# Patient Record
Sex: Female | Born: 1955 | Race: White | Hispanic: No | Smoking: Never smoker
Health system: Southern US, Community
[De-identification: ages and names within clinical notes are randomized; demographics above are authoritative.]

## PROBLEM LIST (undated history)

## (undated) DIAGNOSIS — E785 Hyperlipidemia, unspecified: Secondary | ICD-10-CM

## (undated) DIAGNOSIS — R519 Headache, unspecified: Secondary | ICD-10-CM

## (undated) DIAGNOSIS — R569 Unspecified convulsions: Secondary | ICD-10-CM

## (undated) DIAGNOSIS — G43909 Migraine, unspecified, not intractable, without status migrainosus: Secondary | ICD-10-CM

## (undated) DIAGNOSIS — I10 Essential (primary) hypertension: Secondary | ICD-10-CM

## (undated) DIAGNOSIS — G2581 Restless legs syndrome: Secondary | ICD-10-CM

## (undated) DIAGNOSIS — E079 Disorder of thyroid, unspecified: Secondary | ICD-10-CM

## (undated) DIAGNOSIS — E119 Type 2 diabetes mellitus without complications: Secondary | ICD-10-CM

## (undated) DIAGNOSIS — I499 Cardiac arrhythmia, unspecified: Secondary | ICD-10-CM

## (undated) DIAGNOSIS — R51 Headache: Secondary | ICD-10-CM

## (undated) HISTORY — PX: TONSILLECTOMY: SUR1361

## (undated) HISTORY — PX: ABDOMINAL HYSTERECTOMY: SHX81

## (undated) HISTORY — DX: Unspecified convulsions: R56.9

## (undated) HISTORY — PX: EYE SURGERY: SHX253

## (undated) HISTORY — DX: Migraine, unspecified, not intractable, without status migrainosus: G43.909

## (undated) HISTORY — PX: CARPAL TUNNEL RELEASE: SHX101

## (undated) HISTORY — DX: Hyperlipidemia, unspecified: E78.5

## (undated) HISTORY — PX: ADENOIDECTOMY: SUR15

---

## 1999-10-13 ENCOUNTER — Emergency Department (HOSPITAL_COMMUNITY): Admission: EM | Admit: 1999-10-13 | Discharge: 1999-10-13 | Payer: Self-pay | Admitting: *Deleted

## 2000-03-19 ENCOUNTER — Emergency Department (HOSPITAL_COMMUNITY): Admission: EM | Admit: 2000-03-19 | Discharge: 2000-03-19 | Payer: Self-pay | Admitting: Emergency Medicine

## 2000-03-19 ENCOUNTER — Encounter: Payer: Self-pay | Admitting: Emergency Medicine

## 2001-10-09 ENCOUNTER — Emergency Department (HOSPITAL_COMMUNITY): Admission: EM | Admit: 2001-10-09 | Discharge: 2001-10-09 | Payer: Self-pay | Admitting: Emergency Medicine

## 2002-02-16 ENCOUNTER — Emergency Department (HOSPITAL_COMMUNITY): Admission: EM | Admit: 2002-02-16 | Discharge: 2002-02-16 | Payer: Self-pay

## 2002-02-20 ENCOUNTER — Inpatient Hospital Stay (HOSPITAL_COMMUNITY): Admission: RE | Admit: 2002-02-20 | Discharge: 2002-02-20 | Payer: Self-pay | Admitting: *Deleted

## 2002-02-21 ENCOUNTER — Emergency Department (HOSPITAL_COMMUNITY): Admission: EM | Admit: 2002-02-21 | Discharge: 2002-02-21 | Payer: Self-pay | Admitting: Emergency Medicine

## 2002-03-08 ENCOUNTER — Emergency Department (HOSPITAL_COMMUNITY): Admission: EM | Admit: 2002-03-08 | Discharge: 2002-03-08 | Payer: Self-pay | Admitting: Emergency Medicine

## 2002-10-28 ENCOUNTER — Emergency Department (HOSPITAL_COMMUNITY): Admission: EM | Admit: 2002-10-28 | Discharge: 2002-10-28 | Payer: Self-pay | Admitting: *Deleted

## 2005-05-27 ENCOUNTER — Emergency Department (HOSPITAL_COMMUNITY): Admission: EM | Admit: 2005-05-27 | Discharge: 2005-05-27 | Payer: Self-pay | Admitting: Emergency Medicine

## 2005-06-28 ENCOUNTER — Emergency Department: Payer: Self-pay | Admitting: Emergency Medicine

## 2005-07-23 ENCOUNTER — Emergency Department: Payer: Self-pay | Admitting: Emergency Medicine

## 2005-08-02 ENCOUNTER — Ambulatory Visit: Payer: Self-pay | Admitting: Internal Medicine

## 2005-08-03 ENCOUNTER — Ambulatory Visit: Payer: Self-pay | Admitting: Internal Medicine

## 2005-08-23 ENCOUNTER — Ambulatory Visit: Payer: Self-pay | Admitting: *Deleted

## 2005-09-16 ENCOUNTER — Emergency Department (HOSPITAL_COMMUNITY): Admission: EM | Admit: 2005-09-16 | Discharge: 2005-09-16 | Payer: Self-pay | Admitting: Emergency Medicine

## 2005-11-10 ENCOUNTER — Emergency Department (HOSPITAL_COMMUNITY): Admission: EM | Admit: 2005-11-10 | Discharge: 2005-11-10 | Payer: Self-pay | Admitting: Emergency Medicine

## 2006-10-15 ENCOUNTER — Ambulatory Visit: Payer: Self-pay | Admitting: Nurse Practitioner

## 2006-11-19 ENCOUNTER — Ambulatory Visit: Payer: Self-pay | Admitting: Nurse Practitioner

## 2006-12-17 ENCOUNTER — Ambulatory Visit: Payer: Self-pay | Admitting: Nurse Practitioner

## 2007-01-02 ENCOUNTER — Emergency Department (HOSPITAL_COMMUNITY): Admission: EM | Admit: 2007-01-02 | Discharge: 2007-01-02 | Payer: Self-pay | Admitting: Emergency Medicine

## 2007-01-15 ENCOUNTER — Ambulatory Visit (HOSPITAL_COMMUNITY): Admission: RE | Admit: 2007-01-15 | Discharge: 2007-01-15 | Payer: Self-pay | Admitting: Nurse Practitioner

## 2007-01-17 ENCOUNTER — Ambulatory Visit: Payer: Self-pay | Admitting: Nurse Practitioner

## 2007-01-30 ENCOUNTER — Emergency Department (HOSPITAL_COMMUNITY): Admission: EM | Admit: 2007-01-30 | Discharge: 2007-01-30 | Payer: Self-pay | Admitting: Emergency Medicine

## 2007-01-30 ENCOUNTER — Ambulatory Visit: Payer: Self-pay | Admitting: Nurse Practitioner

## 2007-02-11 ENCOUNTER — Ambulatory Visit: Payer: Self-pay | Admitting: Nurse Practitioner

## 2007-06-25 ENCOUNTER — Encounter (INDEPENDENT_AMBULATORY_CARE_PROVIDER_SITE_OTHER): Payer: Self-pay | Admitting: *Deleted

## 2007-12-24 ENCOUNTER — Emergency Department (HOSPITAL_COMMUNITY): Admission: EM | Admit: 2007-12-24 | Discharge: 2007-12-24 | Payer: Self-pay | Admitting: Emergency Medicine

## 2008-01-22 ENCOUNTER — Emergency Department (HOSPITAL_COMMUNITY): Admission: EM | Admit: 2008-01-22 | Discharge: 2008-01-22 | Payer: Self-pay | Admitting: Emergency Medicine

## 2008-02-02 ENCOUNTER — Ambulatory Visit: Payer: Self-pay | Admitting: Internal Medicine

## 2008-02-04 ENCOUNTER — Encounter (INDEPENDENT_AMBULATORY_CARE_PROVIDER_SITE_OTHER): Payer: Self-pay | Admitting: Nurse Practitioner

## 2008-02-04 ENCOUNTER — Ambulatory Visit: Payer: Self-pay | Admitting: Internal Medicine

## 2008-02-04 LAB — CONVERTED CEMR LAB
ALT: 25 units/L (ref 0–35)
AST: 18 units/L (ref 0–37)
Albumin: 4.3 g/dL (ref 3.5–5.2)
Alkaline Phosphatase: 79 units/L (ref 39–117)
BUN: 11 mg/dL (ref 6–23)
CO2: 24 meq/L (ref 19–32)
Calcium: 9.2 mg/dL (ref 8.4–10.5)
Chloride: 106 meq/L (ref 96–112)
Cholesterol: 213 mg/dL — ABNORMAL HIGH (ref 0–200)
Creatinine, Ser: 0.82 mg/dL (ref 0.40–1.20)
Glucose, Bld: 109 mg/dL — ABNORMAL HIGH (ref 70–99)
HDL: 58 mg/dL (ref >39)
LDL Cholesterol: 123 mg/dL — ABNORMAL HIGH (ref 0–99)
Potassium: 4.8 meq/L (ref 3.5–5.3)
Sodium: 141 meq/L (ref 135–145)
TSH: 6.853 microintl units/mL — ABNORMAL HIGH (ref 0.350–5.50)
Total Bilirubin: 0.5 mg/dL (ref 0.3–1.2)
Total CHOL/HDL Ratio: 3.7
Total Protein: 7 g/dL (ref 6.0–8.3)
Triglycerides: 158 mg/dL — ABNORMAL HIGH (ref <150)
VLDL: 32 mg/dL (ref 0–40)

## 2010-07-27 ENCOUNTER — Emergency Department (HOSPITAL_COMMUNITY)
Admission: EM | Admit: 2010-07-27 | Discharge: 2010-07-27 | Payer: Self-pay | Source: Home / Self Care | Admitting: Emergency Medicine

## 2010-08-04 ENCOUNTER — Emergency Department (HOSPITAL_COMMUNITY): Admission: EM | Admit: 2010-08-04 | Discharge: 2010-08-04 | Payer: Self-pay | Admitting: Emergency Medicine

## 2010-11-17 ENCOUNTER — Inpatient Hospital Stay (INDEPENDENT_AMBULATORY_CARE_PROVIDER_SITE_OTHER)
Admission: RE | Admit: 2010-11-17 | Discharge: 2010-11-17 | Disposition: A | Discharge status: Home / Self Care | Payer: Self-pay | Source: Ambulatory Visit | Attending: Family Medicine | Admitting: Family Medicine

## 2010-11-17 DIAGNOSIS — R040 Epistaxis: Secondary | ICD-10-CM

## 2010-11-18 ENCOUNTER — Inpatient Hospital Stay (INDEPENDENT_AMBULATORY_CARE_PROVIDER_SITE_OTHER)
Admission: RE | Admit: 2010-11-18 | Discharge: 2010-11-18 | Disposition: A | Discharge status: Home / Self Care | Payer: Self-pay | Source: Ambulatory Visit | Attending: Emergency Medicine | Admitting: Emergency Medicine

## 2010-11-18 ENCOUNTER — Emergency Department (HOSPITAL_COMMUNITY)
Admission: EM | Admit: 2010-11-18 | Discharge: 2010-11-19 | Disposition: A | Discharge status: Home / Self Care | Payer: Self-pay | Attending: Emergency Medicine | Admitting: Emergency Medicine

## 2010-11-18 ENCOUNTER — Emergency Department (HOSPITAL_COMMUNITY)
Admission: EM | Admit: 2010-11-18 | Discharge: 2010-11-18 | Disposition: A | Discharge status: Home / Self Care | Payer: Self-pay | Attending: Emergency Medicine | Admitting: Emergency Medicine

## 2010-11-18 DIAGNOSIS — J45909 Unspecified asthma, uncomplicated: Secondary | ICD-10-CM | POA: Insufficient documentation

## 2010-11-18 DIAGNOSIS — I10 Essential (primary) hypertension: Secondary | ICD-10-CM

## 2010-11-18 DIAGNOSIS — Z79899 Other long term (current) drug therapy: Secondary | ICD-10-CM | POA: Insufficient documentation

## 2010-11-18 DIAGNOSIS — E039 Hypothyroidism, unspecified: Secondary | ICD-10-CM | POA: Insufficient documentation

## 2010-11-18 DIAGNOSIS — R040 Epistaxis: Secondary | ICD-10-CM | POA: Insufficient documentation

## 2010-11-18 DIAGNOSIS — F329 Major depressive disorder, single episode, unspecified: Secondary | ICD-10-CM | POA: Insufficient documentation

## 2010-11-18 DIAGNOSIS — F3289 Other specified depressive episodes: Secondary | ICD-10-CM | POA: Insufficient documentation

## 2010-11-18 LAB — POCT I-STAT, CHEM 8
Creatinine, Ser: 1 mg/dL (ref 0.4–1.2)
Glucose, Bld: 114 mg/dL — ABNORMAL HIGH (ref 70–99)
Hemoglobin: 14.3 g/dL (ref 12.0–15.0)
TCO2: 25 mmol/L (ref 0–100)

## 2010-11-18 LAB — CBC
MCH: 30.7 pg (ref 26.0–34.0)
MCHC: 32.6 g/dL (ref 30.0–36.0)
MCV: 94.2 fL (ref 78.0–100.0)
Platelets: 219 10*3/uL (ref 150–400)

## 2010-11-20 ENCOUNTER — Emergency Department (HOSPITAL_COMMUNITY)
Admission: EM | Admit: 2010-11-20 | Discharge: 2010-11-20 | Disposition: A | Discharge status: Home / Self Care | Payer: Self-pay | Attending: Emergency Medicine | Admitting: Emergency Medicine

## 2010-11-20 DIAGNOSIS — E039 Hypothyroidism, unspecified: Secondary | ICD-10-CM | POA: Insufficient documentation

## 2010-11-20 DIAGNOSIS — F329 Major depressive disorder, single episode, unspecified: Secondary | ICD-10-CM | POA: Insufficient documentation

## 2010-11-20 DIAGNOSIS — Z4800 Encounter for change or removal of nonsurgical wound dressing: Secondary | ICD-10-CM | POA: Insufficient documentation

## 2010-11-20 DIAGNOSIS — F3289 Other specified depressive episodes: Secondary | ICD-10-CM | POA: Insufficient documentation

## 2010-11-20 DIAGNOSIS — R040 Epistaxis: Secondary | ICD-10-CM | POA: Insufficient documentation

## 2010-11-20 DIAGNOSIS — I10 Essential (primary) hypertension: Secondary | ICD-10-CM | POA: Insufficient documentation

## 2010-11-20 DIAGNOSIS — J45909 Unspecified asthma, uncomplicated: Secondary | ICD-10-CM | POA: Insufficient documentation

## 2010-11-20 DIAGNOSIS — Z79899 Other long term (current) drug therapy: Secondary | ICD-10-CM | POA: Insufficient documentation

## 2010-12-12 ENCOUNTER — Emergency Department (HOSPITAL_COMMUNITY): Payer: Self-pay

## 2010-12-12 ENCOUNTER — Emergency Department (HOSPITAL_COMMUNITY)
Admission: EM | Admit: 2010-12-12 | Discharge: 2010-12-12 | Disposition: A | Discharge status: Home / Self Care | Payer: Self-pay | Attending: Emergency Medicine | Admitting: Emergency Medicine

## 2010-12-12 DIAGNOSIS — I10 Essential (primary) hypertension: Secondary | ICD-10-CM | POA: Insufficient documentation

## 2010-12-12 DIAGNOSIS — E039 Hypothyroidism, unspecified: Secondary | ICD-10-CM | POA: Insufficient documentation

## 2010-12-12 DIAGNOSIS — F329 Major depressive disorder, single episode, unspecified: Secondary | ICD-10-CM | POA: Insufficient documentation

## 2010-12-12 DIAGNOSIS — W01119A Fall on same level from slipping, tripping and stumbling with subsequent striking against unspecified sharp object, initial encounter: Secondary | ICD-10-CM | POA: Insufficient documentation

## 2010-12-12 DIAGNOSIS — Y9229 Other specified public building as the place of occurrence of the external cause: Secondary | ICD-10-CM | POA: Insufficient documentation

## 2010-12-12 DIAGNOSIS — W268XXA Contact with other sharp object(s), not elsewhere classified, initial encounter: Secondary | ICD-10-CM | POA: Insufficient documentation

## 2010-12-12 DIAGNOSIS — J029 Acute pharyngitis, unspecified: Secondary | ICD-10-CM | POA: Insufficient documentation

## 2010-12-12 DIAGNOSIS — S60559A Superficial foreign body of unspecified hand, initial encounter: Secondary | ICD-10-CM | POA: Insufficient documentation

## 2010-12-12 DIAGNOSIS — F3289 Other specified depressive episodes: Secondary | ICD-10-CM | POA: Insufficient documentation

## 2010-12-12 LAB — RAPID STREP SCREEN (MED CTR MEBANE ONLY): Streptococcus, Group A Screen (Direct): NEGATIVE

## 2011-07-02 LAB — URINE CULTURE: Colony Count: >=100000

## 2011-07-02 LAB — CBC
HCT: 43.8
Hemoglobin: 14.7
MCHC: 33.6
Platelets: 216
RDW: 14

## 2011-07-02 LAB — URINALYSIS, ROUTINE W REFLEX MICROSCOPIC
Hgb urine dipstick: NEGATIVE
Ketones, ur: NEGATIVE
Nitrite: POSITIVE — AB
Urobilinogen, UA: 1
pH: 5

## 2011-07-02 LAB — I-STAT 8, (EC8 V) (CONVERTED LAB)
BUN: 12
Bicarbonate: 26.5 — ABNORMAL HIGH
Glucose, Bld: 98
Sodium: 142
TCO2: 28
pCO2, Ven: 49.5
pH, Ven: 7.337 — ABNORMAL HIGH

## 2011-07-02 LAB — URINE MICROSCOPIC-ADD ON

## 2011-07-02 LAB — DIFFERENTIAL
Basophils Absolute: 0
Basophils Relative: 1
Eosinophils Absolute: 0.1
Eosinophils Relative: 2
Lymphocytes Relative: 37
Monocytes Absolute: 0.7

## 2011-07-02 LAB — POCT I-STAT CREATININE: Operator id: 257131

## 2011-10-07 ENCOUNTER — Emergency Department (HOSPITAL_COMMUNITY)
Admission: EM | Admit: 2011-10-07 | Discharge: 2011-10-07 | Disposition: A | Discharge status: Home / Self Care | Payer: Self-pay | Attending: Emergency Medicine | Admitting: Emergency Medicine

## 2011-10-07 ENCOUNTER — Emergency Department (HOSPITAL_COMMUNITY): Payer: Self-pay

## 2011-10-07 ENCOUNTER — Encounter: Payer: Self-pay | Admitting: *Deleted

## 2011-10-07 DIAGNOSIS — M545 Low back pain, unspecified: Secondary | ICD-10-CM | POA: Insufficient documentation

## 2011-10-07 DIAGNOSIS — N390 Urinary tract infection, site not specified: Secondary | ICD-10-CM | POA: Insufficient documentation

## 2011-10-07 DIAGNOSIS — M533 Sacrococcygeal disorders, not elsewhere classified: Secondary | ICD-10-CM | POA: Insufficient documentation

## 2011-10-07 DIAGNOSIS — M549 Dorsalgia, unspecified: Secondary | ICD-10-CM

## 2011-10-07 LAB — URINE MICROSCOPIC-ADD ON

## 2011-10-07 LAB — URINALYSIS, ROUTINE W REFLEX MICROSCOPIC
Glucose, UA: NEGATIVE mg/dL
Ketones, ur: NEGATIVE mg/dL
Nitrite: POSITIVE — AB
Specific Gravity, Urine: 1.022 (ref 1.005–1.030)
pH: 5.5 (ref 5.0–8.0)

## 2011-10-07 MED ORDER — IBUPROFEN 800 MG PO TABS: 800.0000 mg | ORAL_TABLET | Freq: Once | ORAL | Status: AC

## 2011-10-07 MED ORDER — CIPROFLOXACIN HCL 500 MG PO TABS: 500.0000 mg | ORAL_TABLET | Freq: Two times a day (BID) | ORAL | Status: AC

## 2011-10-07 MED ORDER — IBUPROFEN 800 MG PO TABS
800.0000 mg | ORAL_TABLET | Freq: Once | ORAL | Status: AC
  Administered 2011-10-07: 800 mg via ORAL
  Filled 2011-10-07: qty 1

## 2011-10-07 NOTE — ED Provider Notes (Signed)
History     CSN: [id number]  Arrival date & time 10/07/11  7829   First MD Initiated Contact with Patient 10/07/11 1010      Chief Complaint  Patient presents with  . Back Pain    (Consider location/radiation/quality/duration/timing/severity/associated sxs/prior treatment) HPI Comments: Patient reports lower back pain that began 3 days ago. Radiates down into her coccyx. Pain is exacerbated by lifting legs while lying flat and with walking.  Denies injury, heavy lifting, or falls.  Denies fevers, weakness or numbness of the extremities, abdominal pain, urinary, vaginal, or bowel symptoms, loss of control of bowel or bladder.    Patient is a 55 y.o. female presenting with back pain. The history is provided by the patient.  Back Pain     History reviewed. No pertinent past medical history.  History reviewed. No pertinent past surgical history.  History reviewed. No pertinent family history.  History  Substance Use Topics  . Smoking status: Not on file  . Smokeless tobacco: Not on file  . Alcohol Use: No    OB History    Grav Para Term Preterm Abortions TAB SAB Ect Mult Living                  Review of Systems  Musculoskeletal: Positive for back pain.  All other systems reviewed and are negative.    Allergies  Penicillins; Sulfa antibiotics; and Zithromax  Home Medications  No current outpatient prescriptions on file.  BP 156/94  Pulse 90  Temp(Src) 98.3 F (36.8 C) (Oral)  Resp 18  SpO2 95%  Physical Exam  Nursing note and vitals reviewed. Constitutional: She is oriented to person, place, and time. She appears well-developed and well-nourished.  HENT:  Head: Normocephalic and atraumatic.  Neck: Normal range of motion. Neck supple.  Cardiovascular: Normal rate, regular rhythm and normal heart sounds.   Pulmonary/Chest: Effort normal and breath sounds normal. No respiratory distress. She has no wheezes. She has no rales. She exhibits no tenderness.    Abdominal: Soft. She exhibits no distension and no mass. There is no tenderness. There is no rebound and no guarding.  Musculoskeletal: Normal range of motion. She exhibits no edema and no tenderness.       Cervical back: Normal.       Thoracic back: Normal.       Lumbar back: Normal.       Arms:      Tenderness over sacrum/coccyx, right low back.  Otherwise spine nontender.  Negative straight leg raise.    Neurological: She is alert and oriented to person, place, and time. She has normal strength. No sensory deficit.    ED Course  Procedures (including critical care time)  10:44 AM Patient seen and examined.  Patient a somewhat limited historian, but responds yes/no or in short phrases to direct questions. Pt with low back pain, no neurological findings, no fever.     Labs Reviewed  URINALYSIS, ROUTINE W REFLEX MICROSCOPIC - Abnormal; Notable for the following:    Hgb urine dipstick TRACE (*)    Bilirubin Urine SMALL (*)    Nitrite POSITIVE (*)    Leukocytes, UA MODERATE (*)    All other components within normal limits  URINE MICROSCOPIC-ADD ON - Abnormal; Notable for the following:    Squamous Epithelial / LPF FEW (*)    Bacteria, UA MANY (*)    Crystals CA OXALATE CRYSTALS (*)    All other components within normal limits  PREGNANCY, URINE  URINE CULTURE   Dg Lumbar Spine Complete  10/07/2011  *RADIOLOGY REPORT*  Clinical Data: Low back pain.  LUMBAR SPINE - COMPLETE 4+ VIEW  Comparison: 01/02/2007; 01/30/2007  Findings: There is six non-rib bearing lumbar-type vertebra and 12 pairs of ribs.  Accordingly, the lowest segmental vertebra is labeled S1.  No fracture or significant subluxation is observed.  Intervertebral spurring noted at the L1-2 level anterior to the vertebral body column.  Mild facet arthropathy noted at L5-S1 and S1-2 bilaterally.  IMPRESSION:  1.  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 2.  Anterior intervertebral  spurring at L1-2. 3.  If symptoms persist despite conservative therapy, MRI followup may be warranted.  Original Report Authenticated By: Dellia Cloud, M.D.   Dg Sacrum/coccyx  10/07/2011  *RADIOLOGY REPORT*  Clinical Data: Low back pain.  SACRUM AND COCCYX - 2+ VIEW  Comparison: 10/07/2011  Findings: Transitional s one vertebra is again noted.  The arcuate lines of the sacrum appear maintained.  No sacroiliac joint widening is observed.  No specific abnormality is noted.  IMPRESSION:  1.  No specific sacral abnormality is noted.  Original Report Authenticated By: Dellia Cloud, M.D.     1. Back pain   2. Urinary tract infection       MDM  Patient with three days of low back pain, found to have urinary tract infection.  Patient is a limited historian.  Discharged home with anti-inflammatories and antibiotics.  Urine sent for culture.  Patient to followup with primary care doctor, return to the ED for worsening symptoms.        Rise Patience, [place] 10/07/11 1224

## 2011-10-07 NOTE — ED Notes (Signed)
Reports lower back pain x 3 days, denies urinary symptoms. Ambulatory at triage.

## 2011-10-09 NOTE — ED Provider Notes (Signed)
Medical screening examination/treatment/procedure(s) were performed by non-physician practitioner and as supervising physician I was immediately available for consultation/collaboration.   Deasha Clendenin, MD 10/09/11 1515 

## 2012-02-15 ENCOUNTER — Emergency Department (HOSPITAL_COMMUNITY)
Admission: EM | Admit: 2012-02-15 | Discharge: 2012-02-15 | Disposition: A | Discharge status: Home / Self Care | Payer: Self-pay | Attending: Emergency Medicine | Admitting: Emergency Medicine

## 2012-02-15 ENCOUNTER — Encounter (HOSPITAL_COMMUNITY): Payer: Self-pay

## 2012-02-15 DIAGNOSIS — R599 Enlarged lymph nodes, unspecified: Secondary | ICD-10-CM | POA: Insufficient documentation

## 2012-02-15 DIAGNOSIS — R5383 Other fatigue: Secondary | ICD-10-CM | POA: Insufficient documentation

## 2012-02-15 DIAGNOSIS — R5381 Other malaise: Secondary | ICD-10-CM | POA: Insufficient documentation

## 2012-02-15 DIAGNOSIS — Z79899 Other long term (current) drug therapy: Secondary | ICD-10-CM | POA: Insufficient documentation

## 2012-02-15 DIAGNOSIS — J020 Streptococcal pharyngitis: Secondary | ICD-10-CM | POA: Insufficient documentation

## 2012-02-15 DIAGNOSIS — I10 Essential (primary) hypertension: Secondary | ICD-10-CM | POA: Insufficient documentation

## 2012-02-15 DIAGNOSIS — R509 Fever, unspecified: Secondary | ICD-10-CM | POA: Insufficient documentation

## 2012-02-15 DIAGNOSIS — J45909 Unspecified asthma, uncomplicated: Secondary | ICD-10-CM | POA: Insufficient documentation

## 2012-02-15 HISTORY — DX: Essential (primary) hypertension: I10

## 2012-02-15 HISTORY — DX: Restless legs syndrome: G25.81

## 2012-02-15 HISTORY — DX: Cardiac arrhythmia, unspecified: I49.9

## 2012-02-15 LAB — RAPID STREP SCREEN (MED CTR MEBANE ONLY): Streptococcus, Group A Screen (Direct): POSITIVE — AB

## 2012-02-15 MED ORDER — PENICILLIN G BENZATHINE 1200000 UNIT/2ML IM SUSP
1.2000 10*6.[IU] | Freq: Once | INTRAMUSCULAR | Status: AC
  Administered 2012-02-15: 1.2 10*6.[IU] via INTRAMUSCULAR
  Filled 2012-02-15: qty 2

## 2012-02-15 NOTE — ED Provider Notes (Signed)
Medical screening examination/treatment/procedure(s) were performed by non-physician practitioner and as supervising physician I was immediately available for consultation/collaboration.   [name], MD 02/15/12 [phone]

## 2012-02-15 NOTE — Discharge Instructions (Signed)
Read instructions below to learn more about your diagnosis and for reasons to return to the ED. Followup with your doctor if symptoms are not improving after 3-4 days.  If you do not have a doctor use the resource guide listed below to help he find one. You may return to the emergency department if symptoms worsen, become progressive, or become more concerning. You were treated for strep throat with the antibiotic shot that you were given today.    HOME CARE INSTRUCTIONS  Drink enough fluids to keep your urine clear or pale yellow.  Eat soft, cold foods such as ice cream, frozen ice pops, or gelatin dessert.  Gargle with warm salt water (1 tsp salt per 1 qt of water).  Throat lozenges  Use over the counter medications for symptomatic relief  (Tylenol for fever/pain, Motrin/Ibuprofen for swelling/pain)  To help prevent spreading strep pharyngitis to others, avoid:  Mouth-to-mouth contact with others.  Sharing utensils for eating and drinking.  Coughing around others.   SEEK IMMEDIATE MEDICAL CARE IF:  A rash develops.  Bloody substance is coughed up.  You are unable to swallow liquids or food for 24 hours.  You develop any new symptoms such as uncontrolable vomiting, severe headache, stiff neck, chest pain, or trouble breathing or swallowing.  You have severe throat pain along with drooling or voice changes.   You are unable to fully open the mouth.   RESOURCE GUIDE  Dental Problems  Patients with Medicaid: [facility] [phone] [address]                                           [phone] [address] Phone:  [phone]                                                  Phone:  [phone]  If unable to pay or uninsured, contact:  [facility] or [facility]. to become qualified for the adult dental clinic.  Chronic Pain Problems Contact [facility]  [phone] Patients need to be referred by their  primary care doctor.  Insufficient Money for Medicine Contact [employer]:  call "211" or [facility] 973-612-1936.  No Primary Care Doctor Call [facility]  414-022-6600 Other agencies that provide inexpensive medical care    [facility]  [phone]    [facility]  [phone]    [facility]  [phone]    [facility]  [phone]    [facility]  [phone]    [facility]  [phone]  Psychological Services [facility] Behavioral [facility]  [phone] [facility]  5060758042 [facility] Mental [facility]   [phone] (emergency services [phone])  Substance Abuse Resources [facility]  [phone] [facility] (325)681-1275 [facility] 343-259-7493 Floydene Flock [phone] Residential & Outpatient Substance Abuse Program  ([phone]  Abuse/Neglect [facility] Child Abuse Hotline 970-783-2317 [facility] Child Abuse Hotline (361)156-7541 (After Hours)  Emergency Shelter [facility] 236-108-4199  M[facility] [place] [facility] 820-307-3911)  [phone] [employer] Services [phone]  MRSA Hotline #:   510-288-2694    [facility] Resources  [facility] [place]     [employer]                          [facility] Dept. [address] [place]                       [address]      [address]  Blondell Reveal Phone:  [phone]                                   Phone:  [phone]                 Phone:  [phone]  [facility] Mental [facility] Phone:  [phone]  [employer] Child Abuse Hotline [phone] [phone]) [phone] (After Hours)

## 2012-02-15 NOTE — ED Provider Notes (Signed)
History     CSN: [id number]  Arrival date & time 02/15/12  9629   First MD Initiated Contact with Patient 02/15/12 1011      Chief Complaint  Patient presents with  . Sore Throat    (Consider location/radiation/quality/duration/timing/severity/associated sxs/prior treatment) Patient is a 56 y.o. female presenting with pharyngitis. The history is provided by the patient.  Sore Throat This is a new problem. The current episode started yesterday. The problem occurs constantly. The problem has been gradually worsening. Associated symptoms include chills, fatigue, a fever, a sore throat and swollen glands. Pertinent negatives include no abdominal pain, congestion, coughing, headaches, nausea, neck pain, rash or vomiting. The symptoms are aggravated by swallowing. She has tried nothing for the symptoms.    Past Medical History  Diagnosis Date  . Hypertension   . Irregular heart beat   . Restless leg syndrome   . Asthma     Past Surgical History  Procedure Date  . Eye surgery   . Tonsillectomy   . Cesarean section   . Abdominal hysterectomy   . Carpal tunnel release     History reviewed. No pertinent family history.  History  Substance Use Topics  . Smoking status: Not on file  . Smokeless tobacco: Not on file  . Alcohol Use: No    OB History    Grav Para Term Preterm Abortions TAB SAB Ect Mult Living                  Review of Systems  Constitutional: Positive for fever, chills and fatigue.       Subjective fever  HENT: Positive for sore throat. Negative for ear pain, congestion, rhinorrhea, drooling, trouble swallowing, neck pain, neck stiffness, voice change and sinus pressure.   Respiratory: Negative for cough.   Gastrointestinal: Negative for nausea, vomiting and abdominal pain.  Skin: Negative for pallor and rash.  Neurological: Negative for headaches.    Allergies  Penicillins; Sulfa antibiotics; and Zithromax  Home Medications   Current Outpatient  Rx  Name Route Sig Dispense Refill  . CLONAZEPAM 0.5 MG PO TABS Oral Take 0.5 mg by mouth at bedtime as needed. For sleep    . FLUOXETINE HCL 20 MG PO CAPS Oral Take 20 mg by mouth daily.      BP 141/96  Pulse 93  Temp(Src) 98.4 F (36.9 C) (Oral)  Resp 16  SpO2 95%  Physical Exam  Nursing note and vitals reviewed. Constitutional: She is oriented to person, place, and time. She appears well-developed and well-nourished. No distress.  HENT:  Head: Normocephalic and atraumatic. No trismus in the jaw.  Right Ear: Tympanic membrane, external ear and ear canal normal.  Left Ear: Tympanic membrane, external ear and ear canal normal.  Nose: Nose normal. No rhinorrhea. Right sinus exhibits no maxillary sinus tenderness and no frontal sinus tenderness. Left sinus exhibits no maxillary sinus tenderness and no frontal sinus tenderness.  Mouth/Throat: Uvula is midline and mucous membranes are normal. Normal dentition. No dental abscesses or uvula swelling. Oropharyngeal exudate and posterior oropharyngeal edema present. No posterior oropharyngeal erythema or tonsillar abscesses.       No submental edema, tongue not elevated, no trismus. No impending airway obstruction; Pt able to speak full sentences, swallow intact, no drooling, stridor, or tonsillar/uvula displacement. No palatal petechia  Eyes: Conjunctivae are normal.  Neck: Trachea normal, normal range of motion and full passive range of motion without pain. Neck supple. No rigidity. Erythema present. Normal range of  motion present.       Flexion and extension of neck without pain or difficulty. Able to breath without difficulty in extension.  Cardiovascular: Normal rate and regular rhythm.   Pulmonary/Chest: Effort normal and breath sounds normal. No stridor. No respiratory distress. She has no wheezes.  Abdominal: Soft. There is no tenderness.       No obvious evidence of splenomegaly. Non ttp.   Musculoskeletal: Normal range of motion.    Lymphadenopathy:       Head (right side): No preauricular and no posterior auricular adenopathy present.       Head (left side): No preauricular and no posterior auricular adenopathy present.    She has cervical adenopathy.  Neurological: She is alert and oriented to person, place, and time.  Skin: Skin is warm and dry. No rash noted. She is not diaphoretic.  Psychiatric: She has a normal mood and affect.    ED Course  Procedures (including critical care time)  Labs Reviewed  RAPID STREP SCREEN - Abnormal; Notable for the following:    Streptococcus, Group A Screen (Direct) POSITIVE (*)    All other components within normal limits   No results found.   No diagnosis found.    MDM  Patient with sore throat positive for Strep throat.  No drooling, changes in voice, or difficulty swallowing.  Uvula midline.  Patient given Bicillin IM while in ED and discharged home.  Return precautions discussed.        Pascal Lux [place], PA-C 02/15/12 1708

## 2012-02-15 NOTE — ED Notes (Signed)
Patient presents with sore throat since yesterday; patient reports she may have been running a fever at home yesterday. Patient also reporting painful swallowing.  No airway distress noted

## 2012-02-15 NOTE — ED Notes (Signed)
Mild redness noted to throat, patient reporting painful swallowing.  No acute distress noted.

## 2012-06-10 ENCOUNTER — Emergency Department (HOSPITAL_COMMUNITY)
Admission: EM | Admit: 2012-06-10 | Discharge: 2012-06-10 | Disposition: A | Discharge status: Home / Self Care | Payer: Self-pay | Attending: Emergency Medicine | Admitting: Emergency Medicine

## 2012-06-10 ENCOUNTER — Encounter (HOSPITAL_COMMUNITY): Payer: Self-pay

## 2012-06-10 DIAGNOSIS — R5381 Other malaise: Secondary | ICD-10-CM | POA: Insufficient documentation

## 2012-06-10 DIAGNOSIS — G2581 Restless legs syndrome: Secondary | ICD-10-CM | POA: Insufficient documentation

## 2012-06-10 DIAGNOSIS — E079 Disorder of thyroid, unspecified: Secondary | ICD-10-CM | POA: Insufficient documentation

## 2012-06-10 DIAGNOSIS — J45909 Unspecified asthma, uncomplicated: Secondary | ICD-10-CM | POA: Insufficient documentation

## 2012-06-10 DIAGNOSIS — I10 Essential (primary) hypertension: Secondary | ICD-10-CM | POA: Insufficient documentation

## 2012-06-10 DIAGNOSIS — N390 Urinary tract infection, site not specified: Secondary | ICD-10-CM | POA: Insufficient documentation

## 2012-06-10 DIAGNOSIS — R5383 Other fatigue: Secondary | ICD-10-CM

## 2012-06-10 HISTORY — DX: Disorder of thyroid, unspecified: E07.9

## 2012-06-10 LAB — T4, FREE: Free T4: 1.13 ng/dL (ref 0.80–1.80)

## 2012-06-10 LAB — URINALYSIS, ROUTINE W REFLEX MICROSCOPIC
Bilirubin Urine: NEGATIVE
Glucose, UA: NEGATIVE mg/dL
Ketones, ur: NEGATIVE mg/dL
Protein, ur: NEGATIVE mg/dL

## 2012-06-10 LAB — CBC
HCT: 42.2 % (ref 36.0–46.0)
Hemoglobin: 14.2 g/dL (ref 12.0–15.0)
MCH: 30.8 pg (ref 26.0–34.0)
MCHC: 33.6 g/dL (ref 30.0–36.0)
RDW: 13.9 % (ref 11.5–15.5)

## 2012-06-10 LAB — POCT I-STAT, CHEM 8
BUN: 13 mg/dL (ref 6–23)
Calcium, Ion: 1.2 mmol/L (ref 1.12–1.23)
Creatinine, Ser: 1.1 mg/dL (ref 0.50–1.10)
Glucose, Bld: 131 mg/dL — ABNORMAL HIGH (ref 70–99)
TCO2: 24 mmol/L (ref 0–100)

## 2012-06-10 LAB — GLUCOSE, CAPILLARY: Glucose-Capillary: 129 mg/dL — ABNORMAL HIGH (ref 70–99)

## 2012-06-10 LAB — TSH: TSH: 3.479 u[IU]/mL (ref 0.350–4.500)

## 2012-06-10 MED ORDER — NITROFURANTOIN MONOHYD MACRO 100 MG PO CAPS
100.0000 mg | ORAL_CAPSULE | Freq: Once | ORAL | Status: AC
  Administered 2012-06-10: 100 mg via ORAL
  Filled 2012-06-10: qty 1

## 2012-06-10 MED ORDER — NITROFURANTOIN MONOHYD MACRO 100 MG PO CAPS: 100.0000 mg | ORAL_CAPSULE | Freq: Two times a day (BID) | ORAL | Status: AC

## 2012-06-10 NOTE — ED Provider Notes (Signed)
History     CSN: [id number]  Arrival date & time 06/10/12  1354   First MD Initiated Contact with Patient 06/10/12 1507      Chief Complaint  Patient presents with  . Weakness    thyroid problem    (Consider location/radiation/quality/duration/timing/severity/associated sxs/prior treatment) HPI Comments: "I been feeling weak.  I know it's my thyroid again."  Sandra Patrick presents for evaluation of weakness. She states that she lost her medical benefits some time ago and has not taken her thyroid medication in a year. Over the last several weeks she feels as if she has no energy. She describes feeling constantly tired. She came to the hospital today because this fatigue has been interfering with her ability to perform her job at a [employer].  She denies any fevers, NVD, dysuria, CP, SOB, or syncope.    Patient is a 56 y.o. female presenting with weakness. The history is provided by the patient.  Weakness Primary symptoms do not include headaches, syncope, loss of consciousness, altered mental status, seizures, dizziness, visual change, paresthesias, focal weakness, loss of sensation, speech change, memory loss, fever, nausea or vomiting. The symptoms began more than 1 week ago. The symptoms are unchanged. The neurological symptoms are diffuse. Context: constantly.  Additional symptoms include weakness. Additional symptoms do not include neck stiffness, pain, lower back pain, leg pain, loss of balance, photophobia, aura, hallucinations, nystagmus, taste disturbance, hyperacusis, hearing loss, tinnitus, vertigo, anxiety, irritability or dysphoric mood. Medical issues do not include seizures, cerebral vascular accident, cancer, alcohol use, drug use, diabetes, hypertension or recent surgery. Associated medical issues comments: hypothyroidism.    Past Medical History  Diagnosis Date  . Hypertension   . Irregular heart beat   . Restless leg syndrome   . Asthma   . Thyroid disease     Past  Surgical History  Procedure Date  . Eye surgery   . Tonsillectomy   . Cesarean section   . Abdominal hysterectomy   . Carpal tunnel release     No family history on file.  History  Substance Use Topics  . Smoking status: Not on file  . Smokeless tobacco: Not on file  . Alcohol Use: No    OB History    Grav Para Term Preterm Abortions TAB SAB Ect Mult Living                  Review of Systems  Constitutional: Positive for fatigue. Negative for fever, chills, diaphoresis, activity change, appetite change, irritability and unexpected weight change.  HENT: Negative for hearing loss, congestion, sore throat, facial swelling, rhinorrhea, trouble swallowing, neck pain, neck stiffness, voice change and tinnitus.   Eyes: Negative for photophobia and visual disturbance.  Respiratory: Negative for cough, chest tightness and shortness of breath.   Cardiovascular: Negative for chest pain, palpitations, leg swelling and syncope.  Gastrointestinal: Negative for nausea, vomiting, abdominal pain and diarrhea.  Genitourinary: Negative for dysuria, urgency, frequency, hematuria, decreased urine volume and difficulty urinating.  Musculoskeletal: Negative for myalgias, back pain and arthralgias.  Skin: Negative for rash.  Neurological: Positive for weakness. Negative for dizziness, vertigo, speech change, focal weakness, seizures, loss of consciousness, syncope, light-headedness, numbness, headaches, paresthesias and loss of balance.  Hematological: Negative.   Psychiatric/Behavioral: Negative for hallucinations, memory loss, dysphoric mood, decreased concentration and altered mental status. The patient is not nervous/anxious.     Allergies  Penicillins; Sulfa antibiotics; and Zithromax  Home Medications   Current Outpatient Rx  Name Route  Sig Dispense Refill  . CLONAZEPAM 0.5 MG PO TABS Oral Take 0.5 mg by mouth at bedtime as needed. For sleep    . FLUOXETINE HCL 20 MG PO CAPS Oral Take 20  mg by mouth daily.      BP 155/87  Pulse 72  Temp 98.2 F (36.8 C) (Oral)  Resp 16  Ht 5\' 2"  (1.575 m)  Wt 175 lb (79.379 kg)  BMI 32.01 kg/m2  SpO2 99%  Physical Exam  Nursing note and vitals reviewed. Constitutional: She is oriented to person, place, and time. She appears well-developed and well-nourished. No distress.  HENT:  Head: Normocephalic and atraumatic.  Right Ear: External ear normal.  Left Ear: External ear normal.  Nose: Nose normal.  Mouth/Throat: Oropharynx is clear and moist. No oropharyngeal exudate.  Eyes: Conjunctivae are normal. Pupils are equal, round, and reactive to light. Right eye exhibits no discharge. Left eye exhibits no discharge. No scleral icterus.  Neck: Normal range of motion. Neck supple. No JVD present. No tracheal deviation present. No thyromegaly present.  Cardiovascular: Normal rate, regular rhythm, normal heart sounds and intact distal pulses.  Exam reveals no gallop and no friction rub.   No murmur heard. Pulmonary/Chest: Effort normal and breath sounds normal. No stridor. No respiratory distress. She has no wheezes. She has no rales. She exhibits no tenderness.  Abdominal: Soft. Bowel sounds are normal. She exhibits no distension and no mass. There is no tenderness. There is no rebound and no guarding.  Musculoskeletal: Normal range of motion. She exhibits no edema and no tenderness.  Lymphadenopathy:    She has no cervical adenopathy.  Neurological: She is alert and oriented to person, place, and time.  Skin: Skin is warm and dry. No rash noted. She is not diaphoretic. No erythema. No pallor.  Psychiatric: Her speech is normal and behavior is normal. Thought content normal. Her mood appears not anxious. Her affect is not angry and not blunt. She is not agitated, not aggressive, is not hyperactive, not actively hallucinating and not combative. Thought content is not paranoid. Cognition and memory are normal. Cognition and memory are not  impaired. She exhibits a depressed mood. She expresses no homicidal and no suicidal ideation. She exhibits normal recent memory and normal remote memory. She is attentive.    ED Course  Procedures (including critical care time)  Labs Reviewed  URINALYSIS, ROUTINE W REFLEX MICROSCOPIC - Abnormal; Notable for the following:    APPearance CLOUDY (*)     Nitrite POSITIVE (*)     Leukocytes, UA LARGE (*)     All other components within normal limits  POCT I-STAT, CHEM 8 - Abnormal; Notable for the following:    Glucose, Bld 131 (*)     Hemoglobin 15.3 (*)     All other components within normal limits  GLUCOSE, CAPILLARY - Abnormal; Notable for the following:    Glucose-Capillary 129 (*)     All other components within normal limits  URINE MICROSCOPIC-ADD ON - Abnormal; Notable for the following:    Squamous Epithelial / LPF FEW (*)     Bacteria, UA MANY (*)     All other components within normal limits  CBC  TSH  T4, FREE  URINE CULTURE   No results found.   No diagnosis found.    MDM  Pt presents for evaluation of weakness - No distress noted, stable VS.  Note U/A consistent with a UTI although she denies any symptoms.  Urine culture ordered.  Will treat also.  No significant abnormalities noted on CBC or BMP.  Will obtain a TSH and free T4.  1805.  Pt remains stable, NAD.  Thyroid studies still pending.  Plan discharge home with course of macrobid for UTI.  Discussed indications for rtn to ER.  She demonstrates understanding.      Tobin Chad, MD 06/10/12 [phone]

## 2012-06-10 NOTE — ED Notes (Signed)
Pt has been off thyroid medication x 1 year- unable  to afford.  Pt reports yesterday felt fine and today "not so good" missed work and will need a work note

## 2012-06-10 NOTE — ED Notes (Signed)
Pt presents with no acute distress- symptoms began last Friday with vomiting. Denies vomiting at present- Pt reports ?thryoid level is low

## 2012-06-10 NOTE — ED Notes (Signed)
Pt denies chest pain and shortness of breath.

## 2012-06-10 NOTE — ED Notes (Signed)
Instructed PT I  was going to do an EKG PT refused notified RN Morrie Sheldon

## 2012-06-10 NOTE — ED Notes (Signed)
Bed:WA12<BR> Expected date:<BR> Expected time:<BR> Means of arrival:<BR> Comments:<BR>

## 2012-06-10 NOTE — ED Notes (Signed)
Pt reports increasing fatigue since last Thursday. States has been off medication x's 1 year and feels like she needs her thyroid medication. Denies other complaints. No neuro deficits noted.

## 2012-06-12 LAB — URINE CULTURE

## 2012-06-13 NOTE — ED Notes (Signed)
+  Urine. Patient treated with Macrobid. Sensitive to same. Per protocol MD. °

## 2012-08-19 ENCOUNTER — Emergency Department (HOSPITAL_COMMUNITY)
Admission: EM | Admit: 2012-08-19 | Discharge: 2012-08-19 | Disposition: A | Discharge status: Home / Self Care | Payer: Self-pay | Attending: Emergency Medicine | Admitting: Emergency Medicine

## 2012-08-19 ENCOUNTER — Encounter (HOSPITAL_COMMUNITY): Payer: Self-pay | Admitting: *Deleted

## 2012-08-19 DIAGNOSIS — Y9389 Activity, other specified: Secondary | ICD-10-CM | POA: Insufficient documentation

## 2012-08-19 DIAGNOSIS — Z862 Personal history of diseases of the blood and blood-forming organs and certain disorders involving the immune mechanism: Secondary | ICD-10-CM | POA: Insufficient documentation

## 2012-08-19 DIAGNOSIS — M549 Dorsalgia, unspecified: Secondary | ICD-10-CM

## 2012-08-19 DIAGNOSIS — J45909 Unspecified asthma, uncomplicated: Secondary | ICD-10-CM | POA: Insufficient documentation

## 2012-08-19 DIAGNOSIS — X500XXA Overexertion from strenuous movement or load, initial encounter: Secondary | ICD-10-CM | POA: Insufficient documentation

## 2012-08-19 DIAGNOSIS — I10 Essential (primary) hypertension: Secondary | ICD-10-CM | POA: Insufficient documentation

## 2012-08-19 DIAGNOSIS — Z8679 Personal history of other diseases of the circulatory system: Secondary | ICD-10-CM | POA: Insufficient documentation

## 2012-08-19 DIAGNOSIS — Y929 Unspecified place or not applicable: Secondary | ICD-10-CM | POA: Insufficient documentation

## 2012-08-19 DIAGNOSIS — IMO0002 Reserved for concepts with insufficient information to code with codable children: Secondary | ICD-10-CM | POA: Insufficient documentation

## 2012-08-19 DIAGNOSIS — Z8639 Personal history of other endocrine, nutritional and metabolic disease: Secondary | ICD-10-CM | POA: Insufficient documentation

## 2012-08-19 MED ORDER — OXYCODONE-ACETAMINOPHEN 5-325 MG PO TABS
1.0000 | ORAL_TABLET | Freq: Once | ORAL | Status: AC
  Administered 2012-08-19: 1 via ORAL
  Filled 2012-08-19: qty 1

## 2012-08-19 MED ORDER — IBUPROFEN 600 MG PO TABS: 600.0000 mg | ORAL_TABLET | Freq: Four times a day (QID) | ORAL | Status: DC | PRN

## 2012-08-19 MED ORDER — HYDROCODONE-ACETAMINOPHEN 5-325 MG PO TABS: 1.0000 | ORAL_TABLET | Freq: Four times a day (QID) | ORAL | Status: DC | PRN

## 2012-08-19 MED ORDER — IBUPROFEN 800 MG PO TABS
800.0000 mg | ORAL_TABLET | Freq: Once | ORAL | Status: AC
  Administered 2012-08-19: 800 mg via ORAL
  Filled 2012-08-19: qty 1

## 2012-08-19 NOTE — ED Notes (Signed)
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 Reports she standing "she feels like she has a lot of pressure on her nerves" in her back. Denies falling.   Pt has hx of HTN, does not take medications.

## 2012-08-19 NOTE — ED Provider Notes (Signed)
History     CSN: [id number]  Arrival date & time 08/19/12  1413   First MD Initiated Contact with Patient 08/19/12 1511      Chief Complaint  Patient presents with  . Back Pain    (Consider location/radiation/quality/duration/timing/severity/associated sxs/prior treatment) HPI Comments: Patient is a 56 year old female that presents emergency department complaining of back pain.  Onset of symptoms began last night after removing heavy objects from work truck.  Pain is located primarily in the lumbar region and is described as sharp pain with certain movements that is worsened by ambulation and palpation.  Patient states that she took 3 Klonopin last night and 3 again this morning without relief.  She has not tried any other medications.  Pain rated at a 10/10 in severity with radiation down right thigh.  Patient denies any urinary symptoms, numbness tingling or weakness in lower extremities, loss control of bowel or bladder, cancer or, IV drug use, fever, night sweats, chills or weight loss.  Patient is a 56 y.o. female presenting with back pain. The history is provided by the patient.  Back Pain  Pertinent negatives include no chest pain, no fever, no numbness, no headaches, no abdominal pain and no weakness.    Past Medical History  Diagnosis Date  . Hypertension   . Irregular heart beat   . Restless leg syndrome   . Asthma   . Thyroid disease     Past Surgical History  Procedure Date  . Eye surgery   . Tonsillectomy   . Cesarean section   . Abdominal hysterectomy   . Carpal tunnel release     History reviewed. No pertinent family history.  History  Substance Use Topics  . Smoking status: Not on file  . Smokeless tobacco: Not on file  . Alcohol Use: No    OB History    Grav Para Term Preterm Abortions TAB SAB Ect Mult Living                  Review of Systems  Constitutional: Positive for activity change. Negative for fever, chills, fatigue and unexpected  weight change.  HENT: Negative for neck pain and neck stiffness.   Eyes: Negative for visual disturbance.  Respiratory: Negative for shortness of breath.   Cardiovascular: Negative for chest pain and leg swelling.  Gastrointestinal: Negative for nausea, abdominal pain, constipation and rectal pain.  Genitourinary: Negative for urgency and difficulty urinating.       Patient denies bowel and bladder incontinence.  Musculoskeletal: Positive for back pain and gait problem. Negative for myalgias, joint swelling and arthralgias.  Neurological: Negative for weakness, numbness and headaches.  All other systems reviewed and are negative.    Allergies  Penicillins; Sulfa antibiotics; and Zithromax  Home Medications   Current Outpatient Rx  Name  Route  Sig  Dispense  Refill  . CLONAZEPAM 0.5 MG PO TABS   Oral   Take 0.5 mg by mouth at bedtime as needed. For sleep         . FLUOXETINE HCL 20 MG PO CAPS   Oral   Take 20 mg by mouth daily.           BP 146/105  Pulse 80  Temp 98.1 F (36.7 C) (Oral)  Resp 16  SpO2 96%  Physical Exam  Nursing note and vitals reviewed. Constitutional: She is oriented to person, place, and time. She appears well-developed and well-nourished. No distress.  HENT:  Head: Normocephalic and atraumatic.  Eyes: Conjunctivae normal are normal. No scleral icterus.  Neck: Normal range of motion and full passive range of motion without pain. Neck supple. No spinous process tenderness and no muscular tenderness present. No rigidity. Normal range of motion present. No Brudzinski's sign noted.  Cardiovascular: Normal rate, regular rhythm and intact distal pulses.  Exam reveals no gallop and no friction rub.   No murmur heard. Pulmonary/Chest: Effort normal and breath sounds normal. No respiratory distress. She has no wheezes. She has no rales. She exhibits no tenderness.  Musculoskeletal:       Cervical back: She exhibits normal range of motion, no  tenderness, no bony tenderness and no pain.       Thoracic back: She exhibits no tenderness, no bony tenderness and no pain.       Lumbar back: She exhibits tenderness, bony tenderness and pain. She exhibits no spasm and normal pulse.       Right foot: She exhibits no swelling.       Left foot: She exhibits no swelling.       Bilateral lower extremities nontender without color change, baseline range of motion of extremities with intact distal pulses, capillary refill less than 2 seconds bilaterally.  Pt has increased pain w ROM of lumbar spine. Pain w ambulation, no sign of ataxia.  Neurological: She is alert and oriented to person, place, and time. She has normal strength and normal reflexes. No sensory deficit. Gait (no ataxia, slowed and hunched d/t pain ) abnormal.       Sensation at baseline for light touch in all 4 distal extremities, motor symmetric & bilateral 5/5 (hips: abduction, adduction, flexion; knee: flexion & extension; foot: dorsiflexion, plantar flexion, toes: dorsi flexion)   Skin: Skin is warm and dry. No rash noted. She is not diaphoretic. No erythema. No pallor.  Psychiatric: She has a normal mood and affect.    ED Course  Procedures (including critical care time)  Labs Reviewed - No data to display No results found.   No diagnosis found.    MDM  Low Back pain/ muscle strain  Patient with back pain.  No neurological deficits and normal neuro exam.  Patient can walk but states is painful.  No loss of bowel or bladder control.  No concern for cauda equina.  No fever, night sweats, weight loss, h/o cancer, IVDU.  Work not given, no heavy lifting > 10 lbs. RICE protocol and pain medicine indicated and discussed with patient.  And he        Jaci Carrel, [place] 08/19/12 1555

## 2012-08-20 NOTE — ED Provider Notes (Signed)
Medical screening examination/treatment/procedure(s) were performed by non-physician practitioner and as supervising physician I was immediately available for consultation/collaboration.  Flint Melter, MD 08/20/12 [phone]

## 2012-08-22 ENCOUNTER — Encounter (HOSPITAL_COMMUNITY): Payer: Self-pay | Admitting: *Deleted

## 2012-08-22 ENCOUNTER — Emergency Department (HOSPITAL_COMMUNITY)
Admission: EM | Admit: 2012-08-22 | Discharge: 2012-08-22 | Disposition: A | Discharge status: Home / Self Care | Payer: Self-pay | Attending: Emergency Medicine | Admitting: Emergency Medicine

## 2012-08-22 DIAGNOSIS — M549 Dorsalgia, unspecified: Secondary | ICD-10-CM

## 2012-08-22 DIAGNOSIS — Z8639 Personal history of other endocrine, nutritional and metabolic disease: Secondary | ICD-10-CM | POA: Insufficient documentation

## 2012-08-22 DIAGNOSIS — M545 Low back pain, unspecified: Secondary | ICD-10-CM | POA: Insufficient documentation

## 2012-08-22 DIAGNOSIS — Z862 Personal history of diseases of the blood and blood-forming organs and certain disorders involving the immune mechanism: Secondary | ICD-10-CM | POA: Insufficient documentation

## 2012-08-22 DIAGNOSIS — Z79899 Other long term (current) drug therapy: Secondary | ICD-10-CM | POA: Insufficient documentation

## 2012-08-22 DIAGNOSIS — I10 Essential (primary) hypertension: Secondary | ICD-10-CM | POA: Insufficient documentation

## 2012-08-22 DIAGNOSIS — J45909 Unspecified asthma, uncomplicated: Secondary | ICD-10-CM | POA: Insufficient documentation

## 2012-08-22 DIAGNOSIS — Z8679 Personal history of other diseases of the circulatory system: Secondary | ICD-10-CM | POA: Insufficient documentation

## 2012-08-22 DIAGNOSIS — G2581 Restless legs syndrome: Secondary | ICD-10-CM | POA: Insufficient documentation

## 2012-08-22 NOTE — ED Notes (Signed)
Pt was seen 11/12 for lower back pain. At present pt denies pain. Reports she was not able to take pain medication because it made her stomach upset. Pt reports her boss at work will not accept her work note, and that she needs to be physically cleared of all restrictions.

## 2012-08-22 NOTE — ED Provider Notes (Signed)
Medical screening examination/treatment/procedure(s) were performed by non-physician practitioner and as supervising physician I was immediately available for consultation/collaboration.  Raeford Razor, MD 08/22/12 [phone]

## 2012-08-22 NOTE — ED Notes (Signed)
Pt escorted to d/c window. Pt verbalized understanding d/c instructions. In no acute distress 

## 2012-08-22 NOTE — ED Notes (Signed)
PA at bedside Pt alert and oriented x4. Respirations even and unlabored, bilateral symmetrical rise and fall of chest. Skin warm and dry. In no acute distress. Denies needs.   

## 2012-08-22 NOTE — ED Provider Notes (Signed)
History     CSN: [id number]  Arrival date & time 08/22/12  4696   First MD Initiated Contact with Patient 08/22/12 [phone]      Chief Complaint  Patient presents with  . re-evaluation     (Consider location/radiation/quality/duration/timing/severity/associated sxs/prior treatment) HPI  Patient is a 56 year old female that presents emergency department wanting her work restriction note revoked. He had come to the ER a few days ago complaining of back pain. Onset of symptoms had begun  after removing heavy objects from work truck. Pain was located primarily in the lumbar region and is described as sharp pain with certain movements that is worsened by ambulation and palpation. She denies having anymore pain and wants to be able to work without restrictions. Patient denies any urinary symptoms, numbness tingling or weakness in lower extremities, loss control of bowel or bladder, cancer or, IV drug use, fever, night sweats, chills or weight loss.   Past Medical History  Diagnosis Date  . Hypertension   . Irregular heart beat   . Restless leg syndrome   . Asthma   . Thyroid disease     Past Surgical History  Procedure Date  . Eye surgery   . Tonsillectomy   . Cesarean section   . Abdominal hysterectomy   . Carpal tunnel release     History reviewed. No pertinent family history.  History  Substance Use Topics  . Smoking status: Not on file  . Smokeless tobacco: Not on file  . Alcohol Use: No    OB History    Grav Para Term Preterm Abortions TAB SAB Ect Mult Living                  Review of Systems  Review of Systems  Gen: no weight loss, fevers, chills, night sweats  Lungs:No wheezing, coughing or hemoptysis CV: no chest pain, palpitations, dependent edema or orthopnea  Abd: no abdominal pain, nausea, vomiting  GU: no dysuria or gross hematuria  MSK:  Low back injury Neuro: no headache, no focal neurologic deficits  Skin: no abnormalities Psyche:  negative.   Allergies  Penicillins; Sulfa antibiotics; and Zithromax  Home Medications   Current Outpatient Rx  Name  Route  Sig  Dispense  Refill  . CLONAZEPAM 0.5 MG PO TABS   Oral   Take 0.5 mg by mouth at bedtime as needed. For sleep         . FLUOXETINE HCL 20 MG PO CAPS   Oral   Take 20 mg by mouth daily.         Marland Kitchen HYDROCODONE-ACETAMINOPHEN 5-325 MG PO TABS   Oral   Take 1 tablet by mouth every 6 (six) hours as needed for pain.   12 tablet   0   . IBUPROFEN 600 MG PO TABS   Oral   Take 1 tablet (600 mg total) by mouth every 6 (six) hours as needed for pain.   30 tablet   0     BP 137/88  Pulse 64  Temp 98.1 F (36.7 C) (Oral)  Resp 15  SpO2 99%  Physical Exam  Nursing note and vitals reviewed. Constitutional: She appears well-developed and well-nourished. No distress.  HENT:  Head: Normocephalic and atraumatic.  Eyes: Pupils are equal, round, and reactive to light.  Neck: Normal range of motion. Neck supple.  Cardiovascular: Normal rate and regular rhythm.   Pulmonary/Chest: Effort normal.  Abdominal: Soft.  Musculoskeletal:        Equal  strength to bilateral lower extremities. Neurosensory  function adequate to both legs. Skin color is normal. Skin is warm and moist. I see no step off deformity, no bony tenderness. Pt is able to ambulate without limp. ROM is physiologic. No crepitus, laceration, effusion, swelling.  Pulses are normal    Neurological: She is alert.  Skin: Skin is warm and dry.    ED Course  Procedures (including critical care time)  Labs Reviewed - No data to display No results found.   1. Back pain       MDM  Pt no longer having back pain and wants medical clearance. FROM and able to lift up purse from the ground and put it back on the ground in ER.  Pt has been advised of the symptoms that warrant their return to the ED. Patient has voiced understanding and has agreed to follow-up with the PCP or  specialist.         Dorthula Matas, PA 08/22/12 [phone]

## 2012-12-05 ENCOUNTER — Encounter (HOSPITAL_COMMUNITY): Payer: Self-pay | Admitting: *Deleted

## 2012-12-05 ENCOUNTER — Emergency Department (HOSPITAL_COMMUNITY): Payer: Self-pay

## 2012-12-05 ENCOUNTER — Emergency Department (HOSPITAL_COMMUNITY)
Admission: EM | Admit: 2012-12-05 | Discharge: 2012-12-05 | Disposition: A | Discharge status: Home / Self Care | Payer: Self-pay | Attending: Emergency Medicine | Admitting: Emergency Medicine

## 2012-12-05 DIAGNOSIS — R631 Polydipsia: Secondary | ICD-10-CM | POA: Insufficient documentation

## 2012-12-05 DIAGNOSIS — Z79899 Other long term (current) drug therapy: Secondary | ICD-10-CM | POA: Insufficient documentation

## 2012-12-05 DIAGNOSIS — M545 Low back pain, unspecified: Secondary | ICD-10-CM | POA: Insufficient documentation

## 2012-12-05 DIAGNOSIS — N390 Urinary tract infection, site not specified: Secondary | ICD-10-CM | POA: Insufficient documentation

## 2012-12-05 DIAGNOSIS — E079 Disorder of thyroid, unspecified: Secondary | ICD-10-CM | POA: Insufficient documentation

## 2012-12-05 DIAGNOSIS — G2581 Restless legs syndrome: Secondary | ICD-10-CM | POA: Insufficient documentation

## 2012-12-05 DIAGNOSIS — I10 Essential (primary) hypertension: Secondary | ICD-10-CM | POA: Insufficient documentation

## 2012-12-05 DIAGNOSIS — J45909 Unspecified asthma, uncomplicated: Secondary | ICD-10-CM | POA: Insufficient documentation

## 2012-12-05 DIAGNOSIS — R3589 Other polyuria: Secondary | ICD-10-CM | POA: Insufficient documentation

## 2012-12-05 DIAGNOSIS — Z8679 Personal history of other diseases of the circulatory system: Secondary | ICD-10-CM | POA: Insufficient documentation

## 2012-12-05 LAB — CBC WITH DIFFERENTIAL/PLATELET
Basophils Relative: 1 % (ref 0–1)
Eosinophils Absolute: 0.1 10*3/uL (ref 0.0–0.7)
HCT: 42.8 % (ref 36.0–46.0)
Hemoglobin: 14.4 g/dL (ref 12.0–15.0)
Lymphs Abs: 1.9 10*3/uL (ref 0.7–4.0)
MCH: 31 pg (ref 26.0–34.0)
MCHC: 33.6 g/dL (ref 30.0–36.0)
Monocytes Absolute: 0.6 10*3/uL (ref 0.1–1.0)
Monocytes Relative: 11 % (ref 3–12)
RBC: 4.64 MIL/uL (ref 3.87–5.11)

## 2012-12-05 LAB — URINALYSIS, ROUTINE W REFLEX MICROSCOPIC
Bilirubin Urine: NEGATIVE
Glucose, UA: NEGATIVE mg/dL
Hgb urine dipstick: NEGATIVE
Protein, ur: NEGATIVE mg/dL
Specific Gravity, Urine: 1.01 (ref 1.005–1.030)
Urobilinogen, UA: 0.2 mg/dL (ref 0.0–1.0)

## 2012-12-05 LAB — BASIC METABOLIC PANEL
BUN: 13 mg/dL (ref 6–23)
CO2: 27 mEq/L (ref 19–32)
GFR calc non Af Amer: 72 mL/min — ABNORMAL LOW (ref >90)
Glucose, Bld: 99 mg/dL (ref 70–99)
Potassium: 3.9 mEq/L (ref 3.5–5.1)
Sodium: 139 mEq/L (ref 135–145)

## 2012-12-05 LAB — URINE MICROSCOPIC-ADD ON

## 2012-12-05 MED ORDER — CEPHALEXIN 250 MG PO CAPS: 250.0000 mg | ORAL_CAPSULE | Freq: Four times a day (QID) | ORAL | Status: DC

## 2012-12-05 MED ORDER — KETOROLAC TROMETHAMINE 60 MG/2ML IM SOLN
60.0000 mg | Freq: Once | INTRAMUSCULAR | Status: AC
  Administered 2012-12-05: 60 mg via INTRAMUSCULAR
  Filled 2012-12-05: qty 2

## 2012-12-05 NOTE — ED Provider Notes (Signed)
History     CSN: [id number]  Arrival date & time 12/05/12  7829   First MD Initiated Contact with Patient 12/05/12 [phone]      Chief Complaint  Patient presents with  . Back Pain    (Consider location/radiation/quality/duration/timing/severity/associated sxs/prior treatment) HPI Sandra Patrick is  57 y/o female who presents after syncopal episode that occurred yesterday. Patient is a poor historian. History is limited by the mental capacity of the patient, patient's ability to communicate effectively, and overall poor insight. Patient has long term hx of chronic intermittent back pain. Patient c/o of poliuria and polydipsia.  She has  Noticed  Fruity odor to her urine and is concerned that she may have developing diabetes.  Patient has a family history of diabetes including one brother.  He states that yesterday she went to [employer] for lunch.  As she was walking in the door she states she thinks she lost consciousness.  She is able to extract herself with her left hand.  She does have some bruising in the thenar eminence and but has full strength and does not feel that her hand is broken.  Patient denies any racing or skipping heart.  She denies any previous history of syncope or presyncope.  The patient states that she has been eating and drinking well.  She has a previous history of kidney stones.  She states that they were asymptomatic.  She does come plain of bilateral lower back pain. Denies fevers, chills, myalgias, arthralgias. Denies DOE, SOB, chest tightness or pressure, radiation to left arm, jaw or back, or diaphoresis. Denies dysuria,  or hematuria. Denies headaches, light headedness, weakness, visual disturbances. Denies nausea, vomiting, diarrhea or constipation.     Patient also c/o back pain   Ongoing and inte Past Medical History  Diagnosis Date  . Hypertension   . Irregular heart beat   . Restless leg syndrome   . Asthma   . Thyroid disease     Past Surgical History   Procedure Laterality Date  . Eye surgery    . Tonsillectomy    . Cesarean section    . Abdominal hysterectomy    . Carpal tunnel release      No family history on file.  History  Substance Use Topics  . Smoking status: Not on file  . Smokeless tobacco: Not on file  . Alcohol Use: No    OB History   Grav Para Term Preterm Abortions TAB SAB Ect Mult Living                  Review of Systems Ten systems are reviewed and are negative for acute change except as noted in the HPI  Allergies  Chocolate; Ceclor; Codeine; Penicillins; Sulfa antibiotics; and Zithromax  Home Medications   Current Outpatient Rx  Name  Route  Sig  Dispense  Refill  . clonazePAM (KLONOPIN) 0.5 MG tablet   Oral   Take 0.5 mg by mouth at bedtime as needed. For legs         . FLUoxetine (PROZAC) 20 MG capsule   Oral   Take 20 mg by mouth daily.           BP 161/104  Pulse 77  Temp(Src) 98 F (36.7 C) (Oral)  Resp 18  SpO2 98%  Physical Exam Physical Exam  Nursing note and vitals reviewed. Constitutional: She is oriented to person, place, and time. She appears well-developed and well-nourished. No distress.  HENT:  Head: Normocephalic  and atraumatic.  Eyes: Conjunctivae normal and EOM are normal. Pupils are equal, round, and reactive to light. No scleral icterus.  Exotropia of the left eye Neck: Normal range of motion.  Cardiovascular: Normal rate, regular rhythm and normal heart sounds.  Exam reveals no gallop and no friction rub.   No murmur heard. Pulmonary/Chest: Effort normal and breath sounds normal. No respiratory distress.  Abdominal: Soft. Bowel sounds are normal. She exhibits no distension and no mass. There is no tenderness. There is no guarding.  tender to palpation of the suprapubic region.no CVA tenderness.   Neurological: She is alert and oriented to person, place, and time.  Skin: Skin is warm and dry. She is not diaphoretic.    ED Course  Procedures (including  critical care time)  Labs Reviewed  URINALYSIS, ROUTINE W REFLEX MICROSCOPIC - Abnormal; Notable for the following:    APPearance HAZY (*)    Nitrite POSITIVE (*)    All other components within normal limits  URINE MICROSCOPIC-ADD ON - Abnormal; Notable for the following:    Squamous Epithelial / LPF MANY (*)    Bacteria, UA MANY (*)    All other components within normal limits  CBC WITH DIFFERENTIAL  BASIC METABOLIC PANEL   No results found.   No diagnosis found.    MDM  10:13 AM BP 161/104  Pulse 77  Temp(Src) 98 F (36.7 C) (Oral)  Resp 18  SpO2 98% Patient c/o syncope and urinary frequency. Patient concerned for denvelopment of diabetes.  Basic labs, EKG and do a syncopal workup.  Her neurologic exam is negative and there is no concern for any neurologic process as a cause of her syncope..   10:56 AM He should with UA concerning for urinary tract infection.  Her EKG is a concerning for abnormality.  No arrhythmias present.  Intervals are normal.  Patient denies any hematochezia or melena.  Her hemoglobin is 14.4.  I believe the patient is safe for discharge at this time.  Will suggest followup with cardiology for possible Holter monitoring and further evaluation.  . patient states that she has little money to afford expensive medications.  She states she has been unable to afford Macrobid in the past.  Patient has not used Keflex, but does have reaction to Ceclor previously.  She denies any history of anaphylaxis.  I discussed reasons to immediately stop taking Keflex.  These include difficulty breathing or swallowing, itching of the throat, wheezing, urticaria or angioedema.  Patient expresses understanding.  At this time there does not appear to be any evidence of an acute emergency medical condition and the patient appears stable for discharge with appropriate outpatient follow up.Diagnosis was discussed with patient who verbalizes understanding and is agreeable to  discharge. Pt case discussed with Dr. Blinda Leatherwood who agrees with my plan.     Arthor Captain, PA-C 12/05/12 1116

## 2012-12-05 NOTE — ED Notes (Signed)
Pt states that she fell yesterday. Pt caught herself with her hands. Pt states that she now has lower back pain that is worse when she sits for long periods then stands. Pt ambulatory.

## 2012-12-05 NOTE — ED Provider Notes (Signed)
Medical screening examination/treatment/procedure(s) were performed by non-physician practitioner and as supervising physician I was immediately available for consultation/collaboration.  Gilda Crease, MD 12/05/12 1145

## 2012-12-09 ENCOUNTER — Encounter (HOSPITAL_COMMUNITY): Payer: Self-pay | Admitting: Radiology

## 2012-12-09 ENCOUNTER — Emergency Department (HOSPITAL_COMMUNITY)
Admission: EM | Admit: 2012-12-09 | Discharge: 2012-12-09 | Disposition: A | Discharge status: Home / Self Care | Payer: Self-pay | Attending: Emergency Medicine | Admitting: Emergency Medicine

## 2012-12-09 DIAGNOSIS — Z8679 Personal history of other diseases of the circulatory system: Secondary | ICD-10-CM | POA: Insufficient documentation

## 2012-12-09 DIAGNOSIS — Z792 Long term (current) use of antibiotics: Secondary | ICD-10-CM | POA: Insufficient documentation

## 2012-12-09 DIAGNOSIS — E079 Disorder of thyroid, unspecified: Secondary | ICD-10-CM | POA: Insufficient documentation

## 2012-12-09 DIAGNOSIS — I10 Essential (primary) hypertension: Secondary | ICD-10-CM | POA: Insufficient documentation

## 2012-12-09 DIAGNOSIS — Z79899 Other long term (current) drug therapy: Secondary | ICD-10-CM | POA: Insufficient documentation

## 2012-12-09 DIAGNOSIS — J45909 Unspecified asthma, uncomplicated: Secondary | ICD-10-CM | POA: Insufficient documentation

## 2012-12-09 DIAGNOSIS — R51 Headache: Secondary | ICD-10-CM | POA: Insufficient documentation

## 2012-12-09 DIAGNOSIS — G2581 Restless legs syndrome: Secondary | ICD-10-CM | POA: Insufficient documentation

## 2012-12-09 LAB — URINALYSIS, ROUTINE W REFLEX MICROSCOPIC
Bilirubin Urine: NEGATIVE
Glucose, UA: NEGATIVE mg/dL
Specific Gravity, Urine: 1.023 (ref 1.005–1.030)
pH: 6 (ref 5.0–8.0)

## 2012-12-09 LAB — CBC WITH DIFFERENTIAL/PLATELET
Eosinophils Absolute: 0.1 10*3/uL (ref 0.0–0.7)
Hemoglobin: 14.9 g/dL (ref 12.0–15.0)
Lymphocytes Relative: 35 % (ref 12–46)
Lymphs Abs: 1.5 10*3/uL (ref 0.7–4.0)
MCH: 30.7 pg (ref 26.0–34.0)
Monocytes Relative: 11 % (ref 3–12)
Neutro Abs: 2.3 10*3/uL (ref 1.7–7.7)
Neutrophils Relative %: 52 % (ref 43–77)
Platelets: 186 10*3/uL (ref 150–400)
RBC: 4.85 MIL/uL (ref 3.87–5.11)
WBC: 4.5 10*3/uL (ref 4.0–10.5)

## 2012-12-09 LAB — URINE MICROSCOPIC-ADD ON

## 2012-12-09 LAB — BASIC METABOLIC PANEL
BUN: 16 mg/dL (ref 6–23)
CO2: 29 mEq/L (ref 19–32)
Chloride: 104 mEq/L (ref 96–112)
GFR calc non Af Amer: 72 mL/min — ABNORMAL LOW (ref >90)
Glucose, Bld: 92 mg/dL (ref 70–99)
Potassium: 4 mEq/L (ref 3.5–5.1)
Sodium: 141 mEq/L (ref 135–145)

## 2012-12-09 MED ORDER — PROCHLORPERAZINE EDISYLATE 5 MG/ML IJ SOLN
10.0000 mg | Freq: Once | INTRAMUSCULAR | Status: AC
  Administered 2012-12-09: 10 mg via INTRAVENOUS
  Filled 2012-12-09: qty 2

## 2012-12-09 MED ORDER — SODIUM CHLORIDE 0.9 % IV BOLUS (SEPSIS)
1000.0000 mL | Freq: Once | INTRAVENOUS | Status: AC
  Administered 2012-12-09: 1000 mL via INTRAVENOUS

## 2012-12-09 MED ORDER — DEXAMETHASONE SODIUM PHOSPHATE 10 MG/ML IJ SOLN
10.0000 mg | Freq: Once | INTRAMUSCULAR | Status: AC
  Administered 2012-12-09: 10 mg via INTRAVENOUS
  Filled 2012-12-09: qty 1

## 2012-12-09 MED ORDER — DIPHENHYDRAMINE HCL 50 MG/ML IJ SOLN
25.0000 mg | Freq: Once | INTRAMUSCULAR | Status: AC
  Administered 2012-12-09: 25 mg via INTRAVENOUS
  Filled 2012-12-09: qty 1

## 2012-12-09 NOTE — ED Provider Notes (Signed)
I saw and evaluated the patient, reviewed the resident's note and I agree with the findings and plan.  Pt described multiple nonspecific symptoms.  She reported generalized weakness and a recurrent headache.  She demonstrated no focal neurologic deficits or evidence of meningitis/encephalitis.  A basic screening evaluation was performed.  No acute abnormalities noted.  Pt discharged home.  Tobin Chad, MD 12/09/12 1351

## 2012-12-09 NOTE — ED Provider Notes (Signed)
History     CSN: [id number]  Arrival date & time 12/09/12  4098   First MD Initiated Contact with Patient 12/09/12 [phone]      Chief Complaint  Patient presents with  . Weakness   HPI 57 year old female presents to the emergency department with a headache and generalized weakness. Reports headache is moderate in severity located in bilateral head. No radiation to neck. No unilateral numbness or weakness. No vision changes. No sudden onset not worst of her life. No fevers or neck stiffness. Weakness starting during this time as well. Reports that she just feels like she has no energy. Not able to do things as well. No other symptoms  Past Medical History  Diagnosis Date  . Hypertension   . Irregular heart beat   . Restless leg syndrome   . Asthma   . Thyroid disease     Past Surgical History  Procedure Laterality Date  . Eye surgery    . Tonsillectomy    . Cesarean section    . Abdominal hysterectomy    . Carpal tunnel release      History reviewed. No pertinent family history.  History  Substance Use Topics  . Smoking status: Not on file  . Smokeless tobacco: Not on file  . Alcohol Use: No    OB History   Grav Para Term Preterm Abortions TAB SAB Ect Mult Living                  Review of Systems  Constitutional: Negative for fever and chills.  HENT: Negative for congestion, rhinorrhea, neck pain and neck stiffness.   Respiratory: Negative for cough and shortness of breath.   Cardiovascular: Negative for chest pain.  Gastrointestinal: Negative for nausea, vomiting, abdominal pain, diarrhea, constipation and abdominal distention.  Endocrine: Negative for polyuria.  Genitourinary: Negative for dysuria.  Skin: Negative for rash.  Neurological: Positive for headaches.  Psychiatric/Behavioral: Negative.   All other systems reviewed and are negative.    Allergies  Chocolate; Ceclor; Codeine; Penicillins; Sulfa antibiotics; and Zithromax  Home Medications    Current Outpatient Rx  Name  Route  Sig  Dispense  Refill  . cephALEXin (KEFLEX) 250 MG capsule   Oral   Take 250 mg by mouth 4 (four) times daily.         . clonazePAM (KLONOPIN) 0.5 MG tablet   Oral   Take 0.5 mg by mouth at bedtime as needed. For legs         . FLUoxetine (PROZAC) 20 MG capsule   Oral   Take 20 mg by mouth daily.           BP 148/91  Pulse 78  SpO2 100%  Physical Exam  Nursing note and vitals reviewed. Constitutional: She is oriented to person, place, and time. She appears well-developed and well-nourished. No distress.  HENT:  Head: Normocephalic and atraumatic.  Right Ear: External ear normal.  Left Ear: External ear normal.  Nose: Nose normal.  Mouth/Throat: Oropharynx is clear and moist. No oropharyngeal exudate.  Eyes: EOM are normal. Pupils are equal, round, and reactive to light.  Neck: Normal range of motion. Neck supple. No tracheal deviation present.  Cardiovascular: Normal rate.   Pulmonary/Chest: Effort normal and breath sounds normal. No stridor. No respiratory distress. She has no wheezes. She has no rales.  Abdominal: Soft. She exhibits no distension. There is no tenderness. There is no rebound.  Musculoskeletal: Normal range of motion.  Neurological: She is  alert and oriented to person, place, and time. She has normal strength and normal reflexes. No cranial nerve deficit or sensory deficit. She displays a negative Romberg sign. Coordination and gait normal. GCS eye subscore is 4. GCS verbal subscore is 5. GCS motor subscore is 6.  Skin: Skin is warm and dry. She is not diaphoretic.    ED Course  Procedures (including critical care time)  Labs Reviewed  CBC WITH DIFFERENTIAL  BASIC METABOLIC PANEL   No results found.   1. Headache     MDM   57 year old female presents to the emergency department with generalized fatigue and headache. Patient with history of migraines. Headache insidious onset and not worst headache of  life. Doubt subarachnoid hemorrhage. No fevers or neck stiffness to indicate meningitis. No vision changes to indicate acute angle closure glaucoma. Patient given migraine cocktail and symptoms improved to 0/10. Patient reexamined at baseline. Urine reexamined and no bacteria or nitrites found it is improved from last week. Patient states her outpatient management. Recommend close followup with primary care physician. Patient discharged.       Arloa Koh, MD 12/09/12 1248

## 2012-12-09 NOTE — ED Notes (Signed)
MD at bedside. 

## 2012-12-09 NOTE — ED Notes (Signed)
Pt presents with generalized weakness and nausea X 2 days.

## 2013-03-04 ENCOUNTER — Encounter (HOSPITAL_COMMUNITY): Payer: Self-pay | Admitting: Emergency Medicine

## 2013-03-04 ENCOUNTER — Emergency Department (HOSPITAL_COMMUNITY)
Admission: EM | Admit: 2013-03-04 | Discharge: 2013-03-04 | Disposition: A | Discharge status: Home / Self Care | Payer: Self-pay | Attending: Emergency Medicine | Admitting: Emergency Medicine

## 2013-03-04 DIAGNOSIS — Z79899 Other long term (current) drug therapy: Secondary | ICD-10-CM | POA: Insufficient documentation

## 2013-03-04 DIAGNOSIS — Z880 Allergy status to penicillin: Secondary | ICD-10-CM | POA: Insufficient documentation

## 2013-03-04 DIAGNOSIS — R51 Headache: Secondary | ICD-10-CM | POA: Insufficient documentation

## 2013-03-04 DIAGNOSIS — J45909 Unspecified asthma, uncomplicated: Secondary | ICD-10-CM | POA: Insufficient documentation

## 2013-03-04 DIAGNOSIS — Z8639 Personal history of other endocrine, nutritional and metabolic disease: Secondary | ICD-10-CM | POA: Insufficient documentation

## 2013-03-04 DIAGNOSIS — I10 Essential (primary) hypertension: Secondary | ICD-10-CM | POA: Insufficient documentation

## 2013-03-04 DIAGNOSIS — Z8669 Personal history of other diseases of the nervous system and sense organs: Secondary | ICD-10-CM | POA: Insufficient documentation

## 2013-03-04 DIAGNOSIS — Z862 Personal history of diseases of the blood and blood-forming organs and certain disorders involving the immune mechanism: Secondary | ICD-10-CM | POA: Insufficient documentation

## 2013-03-04 DIAGNOSIS — Z8679 Personal history of other diseases of the circulatory system: Secondary | ICD-10-CM | POA: Insufficient documentation

## 2013-03-04 DIAGNOSIS — J3489 Other specified disorders of nose and nasal sinuses: Secondary | ICD-10-CM | POA: Insufficient documentation

## 2013-03-04 LAB — POCT I-STAT, CHEM 8
HCT: 45 % (ref 36.0–46.0)
Hemoglobin: 15.3 g/dL — ABNORMAL HIGH (ref 12.0–15.0)
Potassium: 5.1 mEq/L (ref 3.5–5.1)
Sodium: 141 mEq/L (ref 135–145)
TCO2: 28 mmol/L (ref 0–100)

## 2013-03-04 MED ORDER — KETOROLAC TROMETHAMINE 30 MG/ML IJ SOLN
30.0000 mg | Freq: Once | INTRAMUSCULAR | Status: AC
  Administered 2013-03-04: 30 mg via INTRAVENOUS
  Filled 2013-03-04: qty 1

## 2013-03-04 MED ORDER — SODIUM CHLORIDE 0.9 % IV BOLUS (SEPSIS): 1000.0000 mL | Freq: Once | INTRAVENOUS | Status: DC

## 2013-03-04 MED ORDER — HYDROCHLOROTHIAZIDE 25 MG PO TABS: 25.0000 mg | ORAL_TABLET | Freq: Every day | ORAL | Status: DC

## 2013-03-04 NOTE — ED Notes (Signed)
Pt here via ems for c/o h/a 2nd degree to increase bp unable to get her meds because of money. Pt has not taken bp meds in month

## 2013-03-04 NOTE — ED Provider Notes (Signed)
History     CSN: [id number]  Arrival date & time 03/04/13  1629   First MD Initiated Contact with Patient 03/04/13 1634      Chief Complaint  Patient presents with  . Headache  . Hypertension    (Consider location/radiation/quality/duration/timing/severity/associated sxs/prior treatment) Patient is a 57 y.o. female presenting with headaches and hypertension.  Headache Hypertension Associated symptoms include headaches.   Pt with history of HTN, thyroid disease and headaches who is non-compliant with meds due to lack of funds, has not had meds for 'a while'. She reports headache a few days ago improved with Motrin at home. States she began having a moderate aching R sided headache associated with ear fullness and nasal congestion. Denies fever, did not take any medications prior to calling EMS today because she didn't want to ride the bus. EMS reports she was hypertensive enroute. Denies any CP, SOB, dysuria, nausea, vomiting, or photophobia.   Past Medical History  Diagnosis Date  . Hypertension   . Irregular heart beat   . Restless leg syndrome   . Asthma   . Thyroid disease     Past Surgical History  Procedure Laterality Date  . Eye surgery    . Tonsillectomy    . Cesarean section    . Abdominal hysterectomy    . Carpal tunnel release      No family history on file.  History  Substance Use Topics  . Smoking status: Not on file  . Smokeless tobacco: Not on file  . Alcohol Use: No    OB History   Grav Para Term Preterm Abortions TAB SAB Ect Mult Living                  Review of Systems  Neurological: Positive for headaches.   All other systems reviewed and are negative except as noted in HPI.   Allergies  Chocolate; Ceclor; Codeine; Penicillins; Sulfa antibiotics; and Zithromax  Home Medications   Current Outpatient Rx  Name  Route  Sig  Dispense  Refill  . cephALEXin (KEFLEX) 250 MG capsule   Oral   Take 250 mg by mouth 4 (four) times daily.          . clonazePAM (KLONOPIN) 0.5 MG tablet   Oral   Take 0.5 mg by mouth at bedtime as needed. For legs         . FLUoxetine (PROZAC) 20 MG capsule   Oral   Take 20 mg by mouth daily.           BP 172/110  Pulse 68  Temp(Src) 99.3 F (37.4 C) (Oral)  SpO2 98%  Physical Exam  Nursing note and vitals reviewed. Constitutional: She is oriented to person, place, and time. She appears well-developed and well-nourished.  HENT:  Head: Normocephalic and atraumatic.  TM normal  Eyes: EOM are normal. Pupils are equal, round, and reactive to light.  Neck: Normal range of motion. Neck supple.  Cardiovascular: Normal rate, normal heart sounds and intact distal pulses.   Pulmonary/Chest: Effort normal and breath sounds normal.  Abdominal: Bowel sounds are normal. She exhibits no distension. There is no tenderness.  Musculoskeletal: Normal range of motion. She exhibits no edema and no tenderness.  Neurological: She is alert and oriented to person, place, and time. She has normal strength. No cranial nerve deficit or sensory deficit.  Skin: Skin is warm and dry. No rash noted.  Psychiatric: She has a normal mood and affect.    ED  Course  Procedures (including critical care time)  Labs Reviewed  POCT I-STAT, CHEM 8 - Abnormal; Notable for the following:    Glucose, Bld 103 (*)    Calcium, Ion 1.09 (*)    Hemoglobin 15.3 (*)    All other components within normal limits   No results found.   No diagnosis found.    MDM  Pt hypertensive, this is chronic and likely the cause of her headache. Cr normal, no evidence of end organ damage. Plan for toradol in the ED for headache, start HCTZ and encourage PCP followup.        Charles B. Bernette Mayers, MD 03/04/13 [phone]

## 2013-03-04 NOTE — ED Notes (Signed)
[id number]<AV> Expected date:<BR> Expected time:<BR> Means of arrival:<BR> Comments:<BR> Headache/htn

## 2013-03-13 ENCOUNTER — Ambulatory Visit: Payer: No Typology Code available for payment source | Attending: Family Medicine | Admitting: Internal Medicine

## 2013-03-13 VITALS — BP 157/112 | HR 54 | Temp 98.7°F | Resp 16 | Wt 178.0 lb

## 2013-03-13 DIAGNOSIS — I10 Essential (primary) hypertension: Secondary | ICD-10-CM | POA: Insufficient documentation

## 2013-03-13 DIAGNOSIS — F411 Generalized anxiety disorder: Secondary | ICD-10-CM | POA: Insufficient documentation

## 2013-03-13 LAB — LIPID PANEL
HDL: 49 mg/dL (ref >39)
Triglycerides: 168 mg/dL — ABNORMAL HIGH (ref <150)

## 2013-03-13 MED ORDER — IBUPROFEN 200 MG PO TABS: 200.0000 mg | ORAL_TABLET | Freq: Four times a day (QID) | ORAL | Status: DC | PRN

## 2013-03-13 MED ORDER — FLUOXETINE HCL 20 MG PO CAPS: 20.0000 mg | ORAL_CAPSULE | Freq: Every day | ORAL | Status: DC

## 2013-03-13 MED ORDER — HYDROCHLOROTHIAZIDE 25 MG PO TABS: 25.0000 mg | ORAL_TABLET | Freq: Every day | ORAL | Status: DC

## 2013-03-13 MED ORDER — AMLODIPINE BESYLATE 5 MG PO TABS: 5.0000 mg | ORAL_TABLET | Freq: Every day | ORAL | Status: DC

## 2013-03-13 MED ORDER — CLONAZEPAM 1 MG PO TABS: 1.0000 mg | ORAL_TABLET | Freq: Two times a day (BID) | ORAL | Status: DC | PRN

## 2013-03-13 NOTE — Patient Instructions (Signed)

## 2013-03-13 NOTE — Progress Notes (Signed)
Patient here to establish care Has chronic lower back pain Thyroid problem Has not been on medication for a long time

## 2013-03-13 NOTE — Progress Notes (Signed)
Patient ID: Sandra Patrick, female   DOB: 03/11/1956, 57 y.o.   MRN: [id number]  CC: New visit  HPI: 57 year old female with past medical history of hypertension, anxiety and depression, restless leg syndrome or presents to the clinic for establishment of care. Patient has no current complaints at this time except for uncontrolled restless leg syndrome. She used to be on Klonopin but since she is no longer following with her initial primary care physician she has not been taking this medication. No complaints of chest pain, shortness of breath or palpitations. No complaints of abdominal pain, nausea or vomiting.  Allergies  Allergen Reactions  . Chocolate Nausea Only and Other (See Comments)    Face feels puffy  . Ceclor (Cefaclor) Rash  . Codeine Swelling and Rash  . Penicillins Rash  . Sulfa Antibiotics Rash  . Zithromax (Azithromycin) Rash   Past Medical History  Diagnosis Date  . Hypertension   . Irregular heart beat   . Restless leg syndrome   . Asthma   . Thyroid disease    No current outpatient prescriptions on file prior to visit.   No current facility-administered medications on file prior to visit.   History reviewed. No pertinent family history. History   Social History  . Marital Status: Divorced    Spouse Name: N/A    Number of Children: N/A  . Years of Education: N/A   Occupational History  . Not on file.   Social History Main Topics  . Smoking status: Not on file  . Smokeless tobacco: Not on file  . Alcohol Use: No  . Drug Use: No  . Sexually Active:    Other Topics Concern  . Not on file   Social History Narrative  . No narrative on file   Family medical history significant for HTN, HLD  Review of Systems  Constitutional: Negative for fever, chills, diaphoresis, activity change, appetite change and fatigue.  HENT: Negative for ear pain, nosebleeds, congestion, facial swelling, rhinorrhea, neck pain, neck stiffness and ear discharge.   Eyes:  Negative for pain, discharge, redness, itching and visual disturbance.  Respiratory: Negative for cough, choking, chest tightness, shortness of breath, wheezing and stridor.   Cardiovascular: Negative for chest pain, palpitations and leg swelling.  Gastrointestinal: Negative for abdominal distention.  Genitourinary: Negative for dysuria, urgency, frequency, hematuria, flank pain, decreased urine volume, difficulty urinating and dyspareunia.  Musculoskeletal: Negative for back pain, joint swelling, arthralgias and gait problem.  Neurological: Negative for dizziness, tremors, seizures, syncope, facial asymmetry, speech difficulty, weakness, light-headedness, numbness and headaches.  Hematological: Negative for adenopathy. Does not bruise/bleed easily.  Psychiatric/Behavioral: Negative for hallucinations, behavioral problems, confusion, dysphoric mood, decreased concentration and agitation.    Objective:   Filed Vitals:   03/13/13 1157  BP: 157/112  Pulse: 54  Temp: 98.7 F (37.1 C)  Resp: 16    Physical Exam  Constitutional: Appears well-developed and well-nourished. No distress.  HENT: Normocephalic. External right and left ear normal. Oropharynx is clear and moist.  Eyes: Conjunctivae and EOM are normal. PERRLA, no scleral icterus.  Neck: Normal ROM. Neck supple. No JVD. No tracheal deviation. No thyromegaly.  CVS: RRR, S1/S2 +, no murmurs, no gallops, no carotid bruit.  Pulmonary: Effort and breath sounds normal, no stridor, rhonchi, wheezes, rales.  Abdominal: Soft. BS +,  no distension, tenderness, rebound or guarding.  Musculoskeletal: Normal range of motion. No edema and no tenderness.  Lymphadenopathy: No lymphadenopathy noted, cervical, inguinal. Neuro: Alert. Normal reflexes, muscle tone  coordination. No cranial nerve deficit. Skin: Skin is warm and dry. No rash noted. Not diaphoretic. No erythema. No pallor.  Psychiatric: Normal mood and affect. Behavior, judgment, thought  content normal.   Lab Results  Component Value Date   WBC 4.5 12/09/2012   HGB 15.3* 03/04/2013   HCT 45.0 03/04/2013   MCV 92.6 12/09/2012   PLT 186 12/09/2012   Lab Results  Component Value Date   CREATININE 0.70 03/04/2013   BUN 17 03/04/2013   NA 141 03/04/2013   K 5.1 03/04/2013   CL 108 03/04/2013   CO2 29 12/09/2012    No results found for this basename: HGBA1C   Lipid Panel     Component Value Date/Time   CHOL 213* 02/04/2008 1959   TRIG 158* 02/04/2008 1959   HDL 58 02/04/2008 1959   CHOLHDL 3.7 Ratio 02/04/2008 1959   VLDL 32 02/04/2008 1959   LDLCALC 123* 02/04/2008 1959       Assessment and plan:   Patient Active Problem List   Diagnosis Date Noted  . Anxiety state, unspecified 03/13/2013    Priority: Medium - Stable. Prescription provided for Prozac. Addition made to the medication regimen with Clonopin 1 mg every 12 hours as needed     Health maintenance  - Repeat cholesterol panel, TSH and A1c today    . HTN (hypertension) 03/13/2013    Priority: Medium - We have discussed target BP range - I have advised pt to check BP regularly and to call us back if the numbers are higher than 140/90 - discussed the importance of compliance with medical therapy and diet  - Since blood pressure is still above normal limits we will add Norvasc 5 mg daily

## 2013-03-16 ENCOUNTER — Emergency Department (HOSPITAL_COMMUNITY)
Admission: EM | Admit: 2013-03-16 | Discharge: 2013-03-16 | Disposition: A | Discharge status: Home / Self Care | Payer: Self-pay | Attending: Emergency Medicine | Admitting: Emergency Medicine

## 2013-03-16 ENCOUNTER — Encounter (HOSPITAL_COMMUNITY): Payer: Self-pay | Admitting: Emergency Medicine

## 2013-03-16 DIAGNOSIS — S335XXA Sprain of ligaments of lumbar spine, initial encounter: Secondary | ICD-10-CM | POA: Insufficient documentation

## 2013-03-16 DIAGNOSIS — S39012A Strain of muscle, fascia and tendon of lower back, initial encounter: Secondary | ICD-10-CM

## 2013-03-16 DIAGNOSIS — Z8639 Personal history of other endocrine, nutritional and metabolic disease: Secondary | ICD-10-CM | POA: Insufficient documentation

## 2013-03-16 DIAGNOSIS — Z8679 Personal history of other diseases of the circulatory system: Secondary | ICD-10-CM | POA: Insufficient documentation

## 2013-03-16 DIAGNOSIS — Y9289 Other specified places as the place of occurrence of the external cause: Secondary | ICD-10-CM | POA: Insufficient documentation

## 2013-03-16 DIAGNOSIS — Z880 Allergy status to penicillin: Secondary | ICD-10-CM | POA: Insufficient documentation

## 2013-03-16 DIAGNOSIS — Z79899 Other long term (current) drug therapy: Secondary | ICD-10-CM | POA: Insufficient documentation

## 2013-03-16 DIAGNOSIS — Z862 Personal history of diseases of the blood and blood-forming organs and certain disorders involving the immune mechanism: Secondary | ICD-10-CM | POA: Insufficient documentation

## 2013-03-16 DIAGNOSIS — I10 Essential (primary) hypertension: Secondary | ICD-10-CM | POA: Insufficient documentation

## 2013-03-16 DIAGNOSIS — X503XXA Overexertion from repetitive movements, initial encounter: Secondary | ICD-10-CM | POA: Insufficient documentation

## 2013-03-16 DIAGNOSIS — J45909 Unspecified asthma, uncomplicated: Secondary | ICD-10-CM | POA: Insufficient documentation

## 2013-03-16 DIAGNOSIS — Y9389 Activity, other specified: Secondary | ICD-10-CM | POA: Insufficient documentation

## 2013-03-16 DIAGNOSIS — Y990 Civilian activity done for income or pay: Secondary | ICD-10-CM | POA: Insufficient documentation

## 2013-03-16 MED ORDER — CYCLOBENZAPRINE HCL 10 MG PO TABS: 10.0000 mg | ORAL_TABLET | Freq: Two times a day (BID) | ORAL | Status: DC | PRN

## 2013-03-16 MED ORDER — CYCLOBENZAPRINE HCL 10 MG PO TABS
5.0000 mg | ORAL_TABLET | Freq: Once | ORAL | Status: AC
  Administered 2013-03-16: 5 mg via ORAL
  Filled 2013-03-16: qty 1

## 2013-03-16 MED ORDER — PREDNISONE 20 MG PO TABS
60.0000 mg | ORAL_TABLET | Freq: Once | ORAL | Status: AC
  Administered 2013-03-16: 60 mg via ORAL
  Filled 2013-03-16: qty 3

## 2013-03-16 MED ORDER — PREDNISONE 10 MG PO TABS: 20.0000 mg | ORAL_TABLET | Freq: Every day | ORAL | Status: DC

## 2013-03-16 NOTE — ED Provider Notes (Signed)
History     CSN: [id number]  Arrival date & time 03/16/13  5784   First MD Initiated Contact with Patient 03/16/13 [phone]      Chief Complaint  Patient presents with  . Back Pain    (Consider location/radiation/quality/duration/timing/severity/associated sxs/prior treatment) Patient is a 57 y.o. female presenting with back pain. The history is provided by the patient.  Back Pain Location:  Lumbar spine Quality:  Aching Radiates to:  Does not radiate Pain severity:  Moderate Onset quality:  Sudden Timing:  Constant Progression:  Worsening Chronicity:  Recurrent Context: lifting heavy objects, occupational injury, recent injury and twisting   Relieved by:  Nothing Worsened by:  Nothing tried Ineffective treatments:  OTC medications Associated symptoms: no abdominal pain, no abdominal swelling, no bladder incontinence, no bowel incontinence, no fever, no headaches, no leg pain, no numbness, no paresthesias, no perianal numbness, no tingling, no weakness and no weight loss     Past Medical History  Diagnosis Date  . Hypertension   . Irregular heart beat   . Restless leg syndrome   . Asthma   . Thyroid disease     Past Surgical History  Procedure Laterality Date  . Eye surgery    . Tonsillectomy    . Cesarean section    . Abdominal hysterectomy    . Carpal tunnel release      No family history on file.  History  Substance Use Topics  . Smoking status: Not on file  . Smokeless tobacco: Not on file  . Alcohol Use: No    OB History   Grav Para Term Preterm Abortions TAB SAB Ect Mult Living                  Review of Systems  Constitutional: Negative for fever and weight loss.  Gastrointestinal: Negative for abdominal pain and bowel incontinence.  Genitourinary: Negative for bladder incontinence.  Musculoskeletal: Positive for back pain.  Neurological: Negative for tingling, weakness, numbness, headaches and paresthesias.  All other systems reviewed and are  negative.    Allergies  Chocolate; Ceclor; Codeine; Penicillins; Sulfa antibiotics; and Zithromax  Home Medications   Current Outpatient Rx  Name  Route  Sig  Dispense  Refill  . amLODipine (NORVASC) 5 MG tablet   Oral   Take 1 tablet (5 mg total) by mouth daily.   30 tablet   3   . clonazePAM (KLONOPIN) 1 MG tablet   Oral   Take 1 tablet (1 mg total) by mouth 2 (two) times daily as needed for anxiety.   45 tablet   0   . cyclobenzaprine (FLEXERIL) 10 MG tablet   Oral   Take 1 tablet (10 mg total) by mouth 2 (two) times daily as needed for muscle spasms.   20 tablet   0   . FLUoxetine (PROZAC) 20 MG capsule   Oral   Take 1 capsule (20 mg total) by mouth daily.   30 capsule   0   . hydrochlorothiazide (HYDRODIURIL) 25 MG tablet   Oral   Take 1 tablet (25 mg total) by mouth daily.   30 tablet   3   . ibuprofen (ADVIL,MOTRIN) 200 MG tablet   Oral   Take 1 tablet (200 mg total) by mouth every 6 (six) hours as needed for pain.   30 tablet   3   . predniSONE (DELTASONE) 10 MG tablet   Oral   Take 2 tablets (20 mg total) by mouth daily.  15 tablet   0     BP 156/100  Pulse 64  Temp(Src) 98.7 F (37.1 C) (Oral)  Resp 20  SpO2 98%  Physical Exam  Nursing note and vitals reviewed. Constitutional: She is oriented to person, place, and time. She appears well-developed and well-nourished.  HENT:  Head: Normocephalic and atraumatic.  Right Ear: Tympanic membrane and external ear normal.  Left Ear: Tympanic membrane and external ear normal.  Nose: Nose normal. Right sinus exhibits no maxillary sinus tenderness and no frontal sinus tenderness. Left sinus exhibits no maxillary sinus tenderness and no frontal sinus tenderness.  Mouth/Throat: Oropharynx is clear and moist.  Eyes: Conjunctivae and EOM are normal. Pupils are equal, round, and reactive to light. Right eye exhibits no nystagmus. Left eye exhibits no nystagmus.  Neck: Normal range of motion. Neck  supple.  Cardiovascular: Normal rate, regular rhythm, normal heart sounds and intact distal pulses.   Pulmonary/Chest: Effort normal and breath sounds normal. No respiratory distress. She exhibits no tenderness.  Abdominal: Soft. Bowel sounds are normal. She exhibits no distension and no mass. There is no tenderness.  Musculoskeletal: Normal range of motion. She exhibits no edema and no tenderness.  Neurological: She is alert and oriented to person, place, and time. She has normal strength and normal reflexes. No sensory deficit. She displays a negative Romberg sign. GCS eye subscore is 4. GCS verbal subscore is 5. GCS motor subscore is 6.  Reflex Scores:      Tricep reflexes are 2+ on the right side and 2+ on the left side.      Bicep reflexes are 2+ on the right side and 2+ on the left side.      Brachioradialis reflexes are 2+ on the right side and 2+ on the left side.      Patellar reflexes are 2+ on the right side and 2+ on the left side.      Achilles reflexes are 2+ on the right side and 2+ on the left side. Patient with normal gait without ataxia, shuffling, spasm, or antalgia. Speech is normal without dysarthria, dysphasia, or aphasia. Muscle strength is 5/5 in bilateral shoulders, elbow flexor and extensors, wrist flexor and extensors, and intrinsic hand muscles. 5/5 bilateral lower extremity hip flexors, extensors, knee flexors and extensors, and ankle dorsi and plantar flexors.    Antalgic gait   Skin: Skin is warm and dry. No rash noted.  Psychiatric: She has a normal mood and affect. Her behavior is normal. Judgment and thought content normal.    ED Course  Procedures (including critical care time)  Labs Reviewed - No data to display No results found.   1. Lumbar strain, initial encounter       MDM  Patient with lumbar strain with will be treated with flexeril and prednisone Hypertension- patient has not been taking meds, but is getting them filled today to restart  and [facility].        Hilario Quarry, MD 03/16/13 [phone]

## 2013-03-16 NOTE — ED Notes (Signed)
EMS called out to the house for back pain that originated on 03/10/13 after she got off work. States she had been unloading boxes all day that day. Pt. Walked to EMS truck and walked to EMS room.

## 2013-04-13 ENCOUNTER — Ambulatory Visit: Payer: Self-pay

## 2013-04-17 ENCOUNTER — Telehealth: Payer: Self-pay | Admitting: Internal Medicine

## 2013-04-17 NOTE — Telephone Encounter (Signed)
Pt calling about lab results for visit on 03/13/13.   Pt also says that medications scripts she received on 03/13/13 say there is no refills available, even though she normally has refills.  Please f/u with pt about this.

## 2013-05-04 ENCOUNTER — Ambulatory Visit: Payer: Self-pay

## 2013-05-21 ENCOUNTER — Ambulatory Visit: Payer: Self-pay

## 2013-05-28 ENCOUNTER — Encounter: Payer: Self-pay | Admitting: Internal Medicine

## 2013-05-28 ENCOUNTER — Ambulatory Visit: Payer: Self-pay

## 2013-05-28 ENCOUNTER — Ambulatory Visit: Payer: No Typology Code available for payment source | Attending: Family Medicine | Admitting: Internal Medicine

## 2013-05-28 VITALS — BP 164/110 | HR 70 | Temp 97.4°F | Resp 16

## 2013-05-28 DIAGNOSIS — M129 Arthropathy, unspecified: Secondary | ICD-10-CM

## 2013-05-28 DIAGNOSIS — I10 Essential (primary) hypertension: Secondary | ICD-10-CM

## 2013-05-28 DIAGNOSIS — M1990 Unspecified osteoarthritis, unspecified site: Secondary | ICD-10-CM

## 2013-05-28 DIAGNOSIS — F411 Generalized anxiety disorder: Secondary | ICD-10-CM

## 2013-05-28 DIAGNOSIS — E039 Hypothyroidism, unspecified: Secondary | ICD-10-CM | POA: Insufficient documentation

## 2013-05-28 MED ORDER — LEVOTHYROXINE SODIUM 50 MCG PO TABS: 50.0000 ug | ORAL_TABLET | Freq: Every day | ORAL | Status: DC

## 2013-05-28 MED ORDER — CLONAZEPAM 1 MG PO TABS: 1.0000 mg | ORAL_TABLET | Freq: Two times a day (BID) | ORAL | Status: DC | PRN

## 2013-05-28 MED ORDER — FLUOXETINE HCL 20 MG PO CAPS: 20.0000 mg | ORAL_CAPSULE | Freq: Every day | ORAL | Status: DC

## 2013-05-28 MED ORDER — HYDROCHLOROTHIAZIDE 25 MG PO TABS: 25.0000 mg | ORAL_TABLET | Freq: Every day | ORAL | Status: DC

## 2013-05-28 MED ORDER — AMLODIPINE BESYLATE 10 MG PO TABS: 10.0000 mg | ORAL_TABLET | Freq: Every day | ORAL | Status: DC

## 2013-05-28 MED ORDER — IBUPROFEN 400 MG PO TABS: 400.0000 mg | ORAL_TABLET | Freq: Four times a day (QID) | ORAL | Status: DC | PRN

## 2013-05-28 MED ORDER — PREDNISONE 10 MG PO TABS: 20.0000 mg | ORAL_TABLET | Freq: Every day | ORAL | Status: DC

## 2013-05-28 MED ORDER — CYCLOBENZAPRINE HCL 10 MG PO TABS: 10.0000 mg | ORAL_TABLET | Freq: Two times a day (BID) | ORAL | Status: DC | PRN

## 2013-05-28 NOTE — Progress Notes (Signed)
PT HERE TO ESTABLISH CARE HX HTN, THYROID,ANXIETY BP 164/110 70 DIZZINESS NOTED NEED MEDICATION REFILL ON MEDS TSH LEVEL 7.033 03/13/13

## 2013-05-28 NOTE — Progress Notes (Signed)
Patient ID: Sandra Patrick, female   DOB: Feb 10, 1956, 57 y.o.   MRN: [id number]  CC: Followup  HPI: Patient was seen in the clinic today for followup visit. No significant complaint. She still has body pains, claims arthritis run in the family. She wants something for pain.   Allergies  Allergen Reactions  . Chocolate Nausea Only and Other (See Comments)    Face feels puffy  . Ceclor [Cefaclor] Rash  . Codeine Swelling and Rash  . Penicillins Rash  . Sulfa Antibiotics Rash  . Zithromax [Azithromycin] Rash   Past Medical History  Diagnosis Date  . Hypertension   . Irregular heart beat   . Restless leg syndrome   . Asthma   . Thyroid disease    Current Outpatient Prescriptions on File Prior to Visit  Medication Sig Dispense Refill  . acetaminophen (TYLENOL) 500 MG tablet Take 500 mg by mouth every 6 (six) hours as needed for pain.       No current facility-administered medications on file prior to visit.   History reviewed. No pertinent family history. History   Social History  . Marital Status: Divorced    Spouse Name: N/A    Number of Children: N/A  . Years of Education: N/A   Occupational History  . Not on file.   Social History Main Topics  . Smoking status: Never Smoker   . Smokeless tobacco: Not on file  . Alcohol Use: No  . Drug Use: No  . Sexual Activity: Not on file   Other Topics Concern  . Not on file   Social History Narrative  . No narrative on file    Review of Systems: Constitutional: Negative for fever, chills, diaphoresis, activity change, appetite change and fatigue. HENT: Negative for ear pain, nosebleeds, congestion, facial swelling, rhinorrhea, neck pain, neck stiffness and ear discharge.  Eyes: Negative for pain, discharge, redness, itching and visual disturbance. Respiratory: Negative for cough, choking, chest tightness, shortness of breath, wheezing and stridor.  Cardiovascular: Negative for chest pain, palpitations and leg  swelling. Gastrointestinal: Negative for abdominal distention. Genitourinary: Negative for dysuria, urgency, frequency, hematuria, flank pain, decreased urine volume, difficulty urinating and dyspareunia.  Musculoskeletal: ++ back pain, No joint swelling or gait problem. Neurological: Negative for dizziness, tremors, seizures, syncope, facial asymmetry, speech difficulty, weakness, light-headedness, numbness and headaches.  Hematological: Negative for adenopathy. Does not bruise/bleed easily. Psychiatric/Behavioral: Negative for hallucinations, behavioral problems, confusion, dysphoric mood, decreased concentration and agitation.    Objective:   Filed Vitals:   05/28/13 1658  BP: 164/110  Pulse: 70  Temp: 97.4 F (36.3 C)  Resp: 16    Physical Exam: Constitutional: Patient appears well-developed and well-nourished. No distress. HENT: Normocephalic, atraumatic, External right and left ear normal. Oropharynx is clear and moist.  Eyes: Conjunctivae and EOM are normal. PERRLA, no scleral icterus. Neck: Normal ROM. Neck supple. No JVD. No tracheal deviation. No thyromegaly. CVS: RRR, S1/S2 +, no murmurs, no gallops, no carotid bruit.  Pulmonary: Effort and breath sounds normal, no stridor, rhonchi, wheezes, rales.  Abdominal: Soft. BS +,  no distension, tenderness, rebound or guarding.  Musculoskeletal: Normal range of motion. No edema and no tenderness.  Lymphadenopathy: No lymphadenopathy noted, cervical, inguinal or axillary Neuro: Alert. Normal reflexes, muscle tone coordination. No cranial nerve deficit. Skin: Skin is warm and dry. No rash noted. Not diaphoretic. No erythema. No pallor. Psychiatric: Normal mood and affect. Behavior, judgment, thought content normal.  Lab Results  Component Value Date  WBC 4.5 12/09/2012   HGB 15.3* 03/04/2013   HCT 45.0 03/04/2013   MCV 92.6 12/09/2012   PLT 186 12/09/2012   Lab Results  Component Value Date   CREATININE 0.70 03/04/2013   BUN 17  03/04/2013   NA 141 03/04/2013   K 5.1 03/04/2013   CL 108 03/04/2013   CO2 29 12/09/2012    Lab Results  Component Value Date   HGBA1C 5.9 % 03/13/2013   Lipid Panel     Component Value Date/Time   CHOL 203* 03/13/2013 1311   TRIG 168* 03/13/2013 1311   HDL 49 03/13/2013 1311   CHOLHDL 4.1 03/13/2013 1311   VLDL 34 03/13/2013 1311   LDLCALC 120* 03/13/2013 1311       Assessment and plan:   Patient Active Problem List   Diagnosis Date Noted  . Unspecified hypothyroidism 05/28/2013  . Arthritis 05/28/2013  . Anxiety state, unspecified 03/13/2013  . HTN (hypertension) 03/13/2013  Refill the following meds: Amlodipine 10 mg tablet by mouth daily Hydrochlorothiazide 25 mg tablet by mouth daily  Fluoxetine 20 mg capsule by mouth daily Klonopin 1 mg tablet each bedtime when necessary  Ibuprofen 400 mg tablet by mouth every 6 hour when necessary pain Prednisone 10 mg tablet by mouth daily for 10 days Rheumatology referral  Start levothyroxin 50 mcg tablet by mouth daily Will repeat TSH and free T4 in 3 months  Patient strongly counseled about exercise and nutrition  Mar Daring Golay was given clear instructions to go to ER or return to the clinic if symptoms don't improve, worsen or new problems develop.  Mar Daring Willhite verbalized understanding.  Mar Daring Esters was told to call to get lab results if hasn't heard anything in the next week.    Jeanann Lewandowsky, MD [facility] [place], [place] [phone]   05/28/2013, 5:36 PM

## 2013-06-10 ENCOUNTER — Telehealth: Payer: Self-pay | Admitting: Emergency Medicine

## 2013-06-10 NOTE — Telephone Encounter (Signed)
Patient states she passed out Sunday before last.  Patient says she does not have any energy and would like her thyroid dose adjusted.      levothyroxine (SYNTHROID, LEVOTHROID) 50 MCG tablet  Order Details    Dose: 50 mcg Route: Oral Frequency: Daily   Dispense Quantity: 90 tablet            Sig: Take 1 tablet (50 mcg total) by mouth daily.

## 2013-06-11 NOTE — Telephone Encounter (Signed)
Pt called again about medication. Please call back

## 2013-06-12 ENCOUNTER — Telehealth: Payer: Self-pay | Admitting: Internal Medicine

## 2013-06-12 NOTE — Telephone Encounter (Signed)
Pt is calling again regarding thyroid, feeling sluggish. Please call pt back

## 2013-06-15 ENCOUNTER — Telehealth: Payer: Self-pay | Admitting: Emergency Medicine

## 2013-06-15 NOTE — Telephone Encounter (Signed)
LEFT MESSAGE ON PT VOICEMAIL TO CALL CLINIC ASAP TO SCHEDULE APPT FOR TSH BLOOD WORK

## 2013-06-17 ENCOUNTER — Telehealth: Payer: Self-pay | Admitting: Internal Medicine

## 2013-06-17 NOTE — Telephone Encounter (Signed)
Pt has a question about her thyroid medication. Please call her.

## 2013-06-19 ENCOUNTER — Telehealth: Payer: Self-pay | Admitting: Emergency Medicine

## 2013-06-19 NOTE — Telephone Encounter (Signed)
PT CALLED WITH C/O DECREASED ENERGY AND DIZZINESS. LAST TSH LEVEL DRAWN 03/23/13 MADE OFFICE APPT 06/22/13 @ 12 PM FOR F/U PT INFORMED TO GO URGENT CARE IF SX WORSENS

## 2013-06-19 NOTE — Telephone Encounter (Signed)
Left message for pt to call back  °

## 2013-06-19 NOTE — Telephone Encounter (Signed)
SPOKE WITH PT. MADE APPT FOR NEXT Monday 06/22/13 C/O INCREASED FATIGUE

## 2013-06-20 ENCOUNTER — Emergency Department (HOSPITAL_COMMUNITY)
Admission: EM | Admit: 2013-06-20 | Discharge: 2013-06-20 | Disposition: A | Discharge status: Home / Self Care | Payer: No Typology Code available for payment source | Attending: Emergency Medicine | Admitting: Emergency Medicine

## 2013-06-20 ENCOUNTER — Encounter (HOSPITAL_COMMUNITY): Payer: Self-pay | Admitting: *Deleted

## 2013-06-20 DIAGNOSIS — E039 Hypothyroidism, unspecified: Secondary | ICD-10-CM | POA: Insufficient documentation

## 2013-06-20 DIAGNOSIS — IMO0002 Reserved for concepts with insufficient information to code with codable children: Secondary | ICD-10-CM | POA: Insufficient documentation

## 2013-06-20 DIAGNOSIS — R5381 Other malaise: Secondary | ICD-10-CM | POA: Insufficient documentation

## 2013-06-20 DIAGNOSIS — Z79899 Other long term (current) drug therapy: Secondary | ICD-10-CM | POA: Insufficient documentation

## 2013-06-20 DIAGNOSIS — R358 Other polyuria: Secondary | ICD-10-CM | POA: Insufficient documentation

## 2013-06-20 DIAGNOSIS — J45909 Unspecified asthma, uncomplicated: Secondary | ICD-10-CM | POA: Insufficient documentation

## 2013-06-20 DIAGNOSIS — R42 Dizziness and giddiness: Secondary | ICD-10-CM | POA: Insufficient documentation

## 2013-06-20 DIAGNOSIS — R631 Polydipsia: Secondary | ICD-10-CM | POA: Insufficient documentation

## 2013-06-20 DIAGNOSIS — I10 Essential (primary) hypertension: Secondary | ICD-10-CM | POA: Insufficient documentation

## 2013-06-20 DIAGNOSIS — R3589 Other polyuria: Secondary | ICD-10-CM | POA: Insufficient documentation

## 2013-06-20 DIAGNOSIS — Z880 Allergy status to penicillin: Secondary | ICD-10-CM | POA: Insufficient documentation

## 2013-06-20 DIAGNOSIS — R531 Weakness: Secondary | ICD-10-CM

## 2013-06-20 LAB — URINE MICROSCOPIC-ADD ON

## 2013-06-20 LAB — BASIC METABOLIC PANEL
BUN: 13 mg/dL (ref 6–23)
Creatinine, Ser: 1 mg/dL (ref 0.50–1.10)
GFR calc Af Amer: 72 mL/min — ABNORMAL LOW (ref >90)
GFR calc non Af Amer: 62 mL/min — ABNORMAL LOW (ref >90)
Potassium: 4.4 mEq/L (ref 3.5–5.1)

## 2013-06-20 LAB — URINALYSIS, ROUTINE W REFLEX MICROSCOPIC
Bilirubin Urine: NEGATIVE
Ketones, ur: NEGATIVE mg/dL
Nitrite: POSITIVE — AB
Urobilinogen, UA: 0.2 mg/dL (ref 0.0–1.0)

## 2013-06-20 LAB — CBC WITH DIFFERENTIAL/PLATELET
Basophils Absolute: 0 10*3/uL (ref 0.0–0.1)
Basophils Relative: 1 % (ref 0–1)
Eosinophils Absolute: 0 10*3/uL (ref 0.0–0.7)
HCT: 44.2 % (ref 36.0–46.0)
MCV: 92.5 fL (ref 78.0–100.0)
Monocytes Absolute: 0.3 10*3/uL (ref 0.1–1.0)
Monocytes Relative: 4 % (ref 3–12)
Neutrophils Relative %: 84 % — ABNORMAL HIGH (ref 43–77)
Platelets: 190 10*3/uL (ref 150–400)
RBC: 4.78 MIL/uL (ref 3.87–5.11)
WBC: 8.1 10*3/uL (ref 4.0–10.5)

## 2013-06-20 LAB — TSH: TSH: 1.955 u[IU]/mL (ref 0.350–4.500)

## 2013-06-20 MED ORDER — SODIUM CHLORIDE 0.9 % IV BOLUS (SEPSIS)
1000.0000 mL | Freq: Once | INTRAVENOUS | Status: AC
  Administered 2013-06-20: 1000 mL via INTRAVENOUS

## 2013-06-20 MED ORDER — ACETAMINOPHEN 325 MG PO TABS: 650.0000 mg | ORAL_TABLET | Freq: Once | ORAL | Status: DC

## 2013-06-20 NOTE — ED Notes (Signed)
Bed: WA01 Expected date: 06/20/13 Expected time: 3:31 PM Means of arrival:  Comments: weakness

## 2013-06-20 NOTE — ED Notes (Signed)
Pt reports generalized weakness x 3 weeks; pt reports having resumed thyroid medicine approximately 1 month ago

## 2013-06-20 NOTE — ED Provider Notes (Signed)
CSN: [id number]     Arrival date & time 06/20/13  1531 History   First MD Initiated Contact with Patient 06/20/13 1537     Chief Complaint  Patient presents with  . Weakness    HPI   Patient's 2 weeks the weakness. Symptoms started when he was driving to work. She felt weak, she felt tired. Return to her apartment. She was walking into the point she nearly passed out. She did not have syncope. She be started back on Synthroid for a TSH of 7.3 in June. Has not had any dosage adjustments. We've started 3 weeks ago and has been present every day. She describes polyuria and polydipsia. No weight loss she is aware of. No chest pain palpitations. No nausea vomiting good appetite Past Medical History  Diagnosis Date  . Hypertension   . Irregular heart beat   . Restless leg syndrome   . Asthma   . Thyroid disease    Past Surgical History  Procedure Laterality Date  . Eye surgery    . Tonsillectomy    . Cesarean section    . Abdominal hysterectomy    . Carpal tunnel release     History reviewed. No pertinent family history. History  Substance Use Topics  . Smoking status: Never Smoker   . Smokeless tobacco: Not on file  . Alcohol Use: No   OB History   Grav Para Term Preterm Abortions TAB SAB Ect Mult Living                 Review of Systems  Constitutional: Positive for fatigue. Negative for fever, chills, diaphoresis and appetite change.  HENT: Negative for sore throat, mouth sores and trouble swallowing.   Eyes: Negative for visual disturbance.  Respiratory: Negative for cough, chest tightness, shortness of breath and wheezing.   Cardiovascular: Negative for chest pain and palpitations.  Gastrointestinal: Negative for nausea, vomiting, abdominal pain, diarrhea and abdominal distention.  Endocrine: Positive for polydipsia and polyuria. Negative for polyphagia.  Genitourinary: Negative for dysuria, frequency and hematuria.  Musculoskeletal: Negative for gait problem.  Skin:  Negative for color change, pallor and rash.  Neurological: Positive for dizziness and weakness. Negative for syncope, light-headedness and headaches.  Hematological: Does not bruise/bleed easily.  Psychiatric/Behavioral: Negative for behavioral problems and confusion.    Allergies  Chocolate; Ceclor; Codeine; Penicillins; Sulfa antibiotics; and Zithromax  Home Medications   Current Outpatient Rx  Name  Route  Sig  Dispense  Refill  . amLODipine (NORVASC) 10 MG tablet   Oral   Take 1 tablet (10 mg total) by mouth daily.   30 tablet   3   . clonazePAM (KLONOPIN) 1 MG tablet   Oral   Take 1 tablet (1 mg total) by mouth 2 (two) times daily as needed for anxiety.   45 tablet   0   . cyclobenzaprine (FLEXERIL) 10 MG tablet   Oral   Take 1 tablet (10 mg total) by mouth 2 (two) times daily as needed for muscle spasms.   20 tablet   3   . FLUoxetine (PROZAC) 20 MG capsule   Oral   Take 20 mg by mouth daily as needed (per md if sluggish).         . hydrochlorothiazide (HYDRODIURIL) 25 MG tablet   Oral   Take 1 tablet (25 mg total) by mouth daily.   30 tablet   3   . levothyroxine (SYNTHROID, LEVOTHROID) 50 MCG tablet   Oral  Take 1 tablet (50 mcg total) by mouth daily.   90 tablet   3   . predniSONE (DELTASONE) 10 MG tablet   Oral   Take 2 tablets (20 mg total) by mouth daily.   15 tablet   0    BP 127/82  Pulse 77  Temp(Src) 98.2 F (36.8 C) (Oral)  Ht 5\' 1"  (1.549 m)  Wt 175 lb (79.379 kg)  BMI 33.08 kg/m2  SpO2 93% Physical Exam  Constitutional: She is oriented to person, place, and time. She appears well-developed and well-nourished. No distress.  Severe distress she is not diaphoretic. She is conversant.  HENT:  Head: Normocephalic.  Extremities are moist, not dried  Eyes: Conjunctivae are normal. Pupils are equal, round, and reactive to light. No scleral icterus.  Neck: Normal range of motion. Neck supple. No thyromegaly present.  Cardiovascular:  Normal rate, regular rhythm and normal heart sounds.  Exam reveals no gallop and no friction rub.   No murmur heard. Normal sinus on monitor. No ectopy.  Pulmonary/Chest: Effort normal and breath sounds normal. No respiratory distress. She has no wheezes. She has no rales.  Abdominal: Soft. Bowel sounds are normal. She exhibits no distension. There is no tenderness. There is no rebound.  Musculoskeletal: Normal range of motion.  Neurological: She is alert and oriented to person, place, and time.  Moves all 4 normal strength. Normal gait.  Skin: Skin is warm and dry. No rash noted.  Psychiatric: She has a normal mood and affect. Her behavior is normal.    ED Course  Procedures (including critical care time) Labs Review Labs Reviewed  CBC WITH DIFFERENTIAL - Abnormal; Notable for the following:    Neutrophils Relative % 84 (*)    Lymphocytes Relative 11 (*)    All other components within normal limits  BASIC METABOLIC PANEL - Abnormal; Notable for the following:    Glucose, Bld 110 (*)    GFR calc non Af Amer 62 (*)    GFR calc Af Amer 72 (*)    All other components within normal limits  URINALYSIS, ROUTINE W REFLEX MICROSCOPIC - Abnormal; Notable for the following:    APPearance CLOUDY (*)    Nitrite POSITIVE (*)    All other components within normal limits  URINE MICROSCOPIC-ADD ON - Abnormal; Notable for the following:    Bacteria, UA MANY (*)    All other components within normal limits  TSH   Imaging Review No results found.  MDM   1. Weakness   2. Hypothyroidism    Salicylate shows she still may be been under treated with Synthroid. TSH and an order status will be resulted today. She has a followup appointment Monday with her primary care physician. I have urged her to keep his appointment to followup with her recent restart of her medications. Remainder of her labs are reassuring. Diagnosis is weakness, possible continued hypothyroidism. She shows no signs of any  concerning symptoms suggestive of a marked hypothyroidism and mild generalized weakness.    Claudean Kinds, MD 06/20/13 [phone]

## 2013-06-22 ENCOUNTER — Encounter: Payer: Self-pay | Admitting: Internal Medicine

## 2013-06-22 ENCOUNTER — Ambulatory Visit: Payer: No Typology Code available for payment source | Attending: Internal Medicine | Admitting: Internal Medicine

## 2013-06-22 VITALS — BP 155/109 | HR 67 | Temp 99.2°F | Resp 16 | Ht 61.42 in | Wt 182.0 lb

## 2013-06-22 DIAGNOSIS — Z0000 Encounter for general adult medical examination without abnormal findings: Secondary | ICD-10-CM

## 2013-06-22 DIAGNOSIS — I10 Essential (primary) hypertension: Secondary | ICD-10-CM

## 2013-06-22 DIAGNOSIS — E039 Hypothyroidism, unspecified: Secondary | ICD-10-CM

## 2013-06-22 DIAGNOSIS — F411 Generalized anxiety disorder: Secondary | ICD-10-CM | POA: Insufficient documentation

## 2013-06-22 LAB — LIPID PANEL
HDL: 51 mg/dL (ref >39)
LDL Cholesterol: 123 mg/dL — ABNORMAL HIGH (ref 0–99)
Triglycerides: 186 mg/dL — ABNORMAL HIGH (ref <150)
VLDL: 37 mg/dL (ref 0–40)

## 2013-06-22 MED ORDER — CLONAZEPAM 1 MG PO TABS: 1.0000 mg | ORAL_TABLET | Freq: Two times a day (BID) | ORAL | Status: DC | PRN

## 2013-06-22 NOTE — Progress Notes (Signed)
Pt is here for a F/U visit. Pt wants to know why she is feeling tiered and weak she also having moments when she is passing out. Pt is also here to get lab work today.

## 2013-06-22 NOTE — Progress Notes (Signed)
Patient ID: Sandra Patrick, female   DOB: Mar 15, 1956, 57 y.o.   MRN: [id number]  CC: Followup  HPI: 57 year old female with past medical history anxiety and depression, hypertension and hypothyroidism who presented to clinic for followup. Patient reports being compliant with medications but did not take blood pressure medications this morning. Patient reports passing out 2 times one week prior to this visit. She reports her boyfriend witnessed the event. She lost consciousness for 2 seconds and felt fine afterwards. Patient feels lightheaded prior to passing out. She was seen in emergency department one time for evaluation and was determined to have dehydration that's causing her to pass out. No current complaints of chest pain or shortness of breath. No fever or chills. No abdominal pain, nausea or vomiting. No constipation or diarrhea. No blood in the stool or urine.  Allergies  Allergen Reactions  . Chocolate Nausea Only and Other (See Comments)    Face feels puffy  . Ceclor [Cefaclor] Rash  . Codeine Swelling and Rash  . Penicillins Rash  . Sulfa Antibiotics Rash  . Zithromax [Azithromycin] Rash   Past Medical History  Diagnosis Date  . Hypertension   . Irregular heart beat   . Restless leg syndrome   . Asthma   . Thyroid disease    Current Outpatient Prescriptions on File Prior to Visit  Medication Sig Dispense Refill  . amLODipine (NORVASC) 10 MG tablet Take 1 tablet (10 mg total) by mouth daily.  30 tablet  3  . cyclobenzaprine (FLEXERIL) 10 MG tablet Take 1 tablet (10 mg total) by mouth 2 (two) times daily as needed for muscle spasms.  20 tablet  3  . FLUoxetine (PROZAC) 20 MG capsule Take 20 mg by mouth daily as needed (per md if sluggish).      . hydrochlorothiazide (HYDRODIURIL) 25 MG tablet Take 1 tablet (25 mg total) by mouth daily.  30 tablet  3  . levothyroxine (SYNTHROID, LEVOTHROID) 50 MCG tablet Take 1 tablet (50 mcg total) by mouth daily.  90 tablet  3   No current  facility-administered medications on file prior to visit.   Family history positive for hypertension in mother  History   Social History  . Marital Status: Divorced    Spouse Name: N/A    Number of Children: N/A  . Years of Education: N/A   Occupational History  . Not on file.   Social History Main Topics  . Smoking status: Never Smoker   . Smokeless tobacco: Not on file  . Alcohol Use: No  . Drug Use: No  . Sexual Activity: Yes   Other Topics Concern  . Not on file   Social History Narrative  . No narrative on file    Review of Systems  Constitutional: Negative for fever, chills, diaphoresis, activity change, appetite change and fatigue.  HENT: Negative for ear pain, nosebleeds, congestion, facial swelling, rhinorrhea, neck pain, neck stiffness and ear discharge.   Eyes: Negative for pain, discharge, redness, itching and visual disturbance.  Respiratory: Negative for cough, choking, chest tightness, shortness of breath, wheezing and stridor.   Cardiovascular: Negative for chest pain, palpitations and leg swelling.  Gastrointestinal: Negative for abdominal distention.  Genitourinary: Negative for dysuria, urgency, frequency, hematuria, flank pain, decreased urine volume, difficulty urinating and dyspareunia.  Musculoskeletal: Negative for back pain, joint swelling, arthralgias and gait problem.  Neurological: Positive for a passing out a few occasions one week ago.  or loss of sensation and no weakness Hematological: Negative  for adenopathy. Does not bruise/bleed easily.  Psychiatric/Behavioral: Negative for hallucinations, behavioral problems, confusion, dysphoric mood, decreased concentration and agitation.    Objective:   Filed Vitals:   06/22/13 1122  BP: 155/109  Pulse: 67  Temp: 99.2 F (37.3 C)  Resp: 16    Physical Exam  Constitutional: Appears well-developed and well-nourished. No distress.  HENT: Normocephalic. External right and left ear normal.  Oropharynx is clear and moist.  Eyes: Conjunctivae and EOM are normal. PERRLA, no scleral icterus.  Neck: Normal ROM. Neck supple. No JVD. No tracheal deviation. No thyromegaly.  CVS: RRR, S1/S2 +, no murmurs, no gallops, no carotid bruit.  Pulmonary: Effort and breath sounds normal, no stridor, rhonchi, wheezes, rales.  Abdominal: Soft. BS +,  no distension, tenderness, rebound or guarding.  Musculoskeletal: Normal range of motion. No edema and no tenderness.  Lymphadenopathy: No lymphadenopathy noted, cervical, inguinal. Neuro: Alert. Normal reflexes, muscle tone coordination. No cranial nerve deficit. Skin: Skin is warm and dry. No rash noted. Not diaphoretic. No erythema. No pallor.  Psychiatric: Normal mood and affect. Behavior, judgment, thought content normal.   Lab Results  Component Value Date   WBC 8.1 06/20/2013   HGB 14.6 06/20/2013   HCT 44.2 06/20/2013   MCV 92.5 06/20/2013   PLT 190 06/20/2013   Lab Results  Component Value Date   CREATININE 1.00 06/20/2013   BUN 13 06/20/2013   NA 141 06/20/2013   K 4.4 06/20/2013   CL 107 06/20/2013   CO2 24 06/20/2013    Lab Results  Component Value Date   HGBA1C 5.9 % 03/13/2013   Lipid Panel     Component Value Date/Time   CHOL 203* 03/13/2013 1311   TRIG 168* 03/13/2013 1311   HDL 49 03/13/2013 1311   CHOLHDL 4.1 03/13/2013 1311   VLDL 34 03/13/2013 1311   LDLCALC 120* 03/13/2013 1311       Assessment and plan:   Patient Active Problem List   Diagnosis Date Noted  . Unspecified hypothyroidism 05/28/2013    Priority: Medium - TSH WNL - continue Synthroid 50 mcg daily   . Anxiety state, unspecified 03/13/2013    Priority: Medium - Can continue clonazepam. Referral provided to pain management clinic to continue providing prescriptions for this medication  - Reports of passing out one week ago. No current problems  - Referral provided to neurology  . HTN (hypertension) 03/13/2013    Priority: Medium - We have discussed target BP  range - I have advised pt to check BP regularly and to call us back if the numbers are higher than 140/90 - discussed the importance of compliance with medical therapy and diet  - Patient did not take morning medications. Continue Norvasc and HCTZ  - Check lipid panel and A1c today

## 2013-06-22 NOTE — Patient Instructions (Signed)

## 2013-06-24 ENCOUNTER — Telehealth: Payer: Self-pay | Admitting: Internal Medicine

## 2013-06-24 NOTE — Telephone Encounter (Signed)
Pt would like to talk to nurse. Pt fell at work and says she blacked out for a couple of seconds and is wondering if she should come in to be seen.  She does not know if her blackout will affect her seizures.  She scheduled an appt 06/25/13 at 3:30pm.  Please f/u with pt if not necessary for her to be checked out by DR please cancel 9/18 appt.

## 2013-06-25 ENCOUNTER — Ambulatory Visit: Payer: No Typology Code available for payment source | Attending: Internal Medicine | Admitting: Internal Medicine

## 2013-06-25 ENCOUNTER — Encounter: Payer: Self-pay | Admitting: Internal Medicine

## 2013-06-25 VITALS — BP 133/89 | HR 81 | Temp 98.5°F | Ht 62.0 in | Wt 182.6 lb

## 2013-06-25 DIAGNOSIS — I10 Essential (primary) hypertension: Secondary | ICD-10-CM

## 2013-06-25 DIAGNOSIS — E785 Hyperlipidemia, unspecified: Secondary | ICD-10-CM

## 2013-06-25 DIAGNOSIS — R55 Syncope and collapse: Secondary | ICD-10-CM | POA: Insufficient documentation

## 2013-06-25 DIAGNOSIS — E039 Hypothyroidism, unspecified: Secondary | ICD-10-CM | POA: Insufficient documentation

## 2013-06-25 NOTE — Progress Notes (Signed)
Patient ID: Sandra Patrick, female   DOB: 1956-07-26, 57 y.o.   MRN: [id number]  HPI 57 year old female with history of hypertension, hypothyroidism who was seen in the clinic 3 days back for 2 episodes of syncope over past 2 weeks. She reported that she passed out briefly for a few seconds which was witnessed without any injuries he sustained. Blood work was done the visit with plan for followup in 2 weeks however patient called yesterday to reschedule an appointment that she had another near syncopal episode at work. She says she felt very dizzy and felt like she was going to pass out and feel better after sitting down. She denies any headache, blurred vision, nausea, vomiting, chest pain, palpitations, shortness of breath, abdominal pain, tingling or numbness of extremities, bowel or urinary symptoms, fever or chills.   Review of systems As outlined in history of present illness  Vital signs in last 24 hours:  Filed Vitals:   06/25/13 1526  BP: 133/89  Pulse: 81  Temp: 98.5 F (36.9 C)  TempSrc: Oral  Height: 5\' 2"  (1.575 m)  Weight: 182 lb 9.6 oz (82.827 kg)  SpO2: 97%    Intake/Output from previous day:  @IOBRIEF @  Physical Exam:  General: Middle aged female in no acute distress. HEENT: no pallor, no icterus, moist oral mucosa,  Heart: Normal  s1 &s2  Regular rate and rhythm, without murmurs, rubs, gallops. Lungs: Clear to auscultation bilaterally. Abdomen: Soft, nontender, nondistended, positive bowel sounds. Extremities: No clubbing cyanosis or edema with positive pedal pulses. Neuro: Alert, awake, oriented x3, nonfocal.   Lab Results:  Basic Metabolic Panel:    Component Value Date/Time   NA 141 06/20/2013 1608   K 4.4 06/20/2013 1608   CL 107 06/20/2013 1608   CO2 24 06/20/2013 1608   BUN 13 06/20/2013 1608   CREATININE 1.00 06/20/2013 1608   GLUCOSE 110* 06/20/2013 1608   CALCIUM 8.9 06/20/2013 1608   CBC:    Component Value Date/Time   WBC 8.1 06/20/2013 1608   HGB 14.6 06/20/2013 1608   HCT 44.2 06/20/2013 1608   PLT 190 06/20/2013 1608   MCV 92.5 06/20/2013 1608   NEUTROABS 6.8 06/20/2013 1608   LYMPHSABS 0.9 06/20/2013 1608   MONOABS 0.3 06/20/2013 1608   EOSABS 0.0 06/20/2013 1608   BASOSABS 0.0 06/20/2013 1608    No results found for this or any previous visit (from the past 240 hour(s)).  Studies/Results: No results found.  Medications: Reviewed  Assessment/Plan: Near syncope -orthostatic vitals checked and  was negative. Recent EKG was unremarkable. I will order for a head CT. Blood pressure appears stable. Continue home BP medications - blood work including TSH was unremarkable. Lipid panel did suggest dyslipidemia with elevated triglycerides. -will not start any medication at this time and counseled on dietary modifications and regular exercise.  Followup as needed if symptoms recur.  Gene Colee 06/25/2013, 3:58 PM

## 2013-06-29 ENCOUNTER — Ambulatory Visit (HOSPITAL_COMMUNITY)
Admission: RE | Admit: 2013-06-29 | Discharge: 2013-06-29 | Disposition: A | Discharge status: Home / Self Care | Payer: No Typology Code available for payment source | Source: Ambulatory Visit | Attending: Internal Medicine | Admitting: Internal Medicine

## 2013-06-29 DIAGNOSIS — R55 Syncope and collapse: Secondary | ICD-10-CM | POA: Insufficient documentation

## 2013-07-03 NOTE — Telephone Encounter (Signed)
Pt was seen on 06/25/13.

## 2013-07-10 ENCOUNTER — Other Ambulatory Visit (HOSPITAL_COMMUNITY): Payer: Self-pay | Admitting: Internal Medicine

## 2013-07-10 ENCOUNTER — Ambulatory Visit: Payer: No Typology Code available for payment source | Attending: Internal Medicine | Admitting: Internal Medicine

## 2013-07-10 VITALS — BP 132/81 | HR 68 | Temp 98.7°F | Resp 18 | Wt 186.0 lb

## 2013-07-10 DIAGNOSIS — F3289 Other specified depressive episodes: Secondary | ICD-10-CM | POA: Insufficient documentation

## 2013-07-10 DIAGNOSIS — I10 Essential (primary) hypertension: Secondary | ICD-10-CM | POA: Insufficient documentation

## 2013-07-10 DIAGNOSIS — R55 Syncope and collapse: Secondary | ICD-10-CM

## 2013-07-10 DIAGNOSIS — Z23 Encounter for immunization: Secondary | ICD-10-CM

## 2013-07-10 DIAGNOSIS — E785 Hyperlipidemia, unspecified: Secondary | ICD-10-CM | POA: Insufficient documentation

## 2013-07-10 DIAGNOSIS — F329 Major depressive disorder, single episode, unspecified: Secondary | ICD-10-CM | POA: Insufficient documentation

## 2013-07-10 MED ORDER — ATORVASTATIN CALCIUM 10 MG PO TABS: 10.0000 mg | ORAL_TABLET | Freq: Every day | ORAL | Status: DC

## 2013-07-10 NOTE — Progress Notes (Signed)
Pt is here for a f/u and go over recent CT results Voices no new concerns.... Alert w/no signs of acute distress.

## 2013-07-10 NOTE — Progress Notes (Signed)
Patient ID: Sandra Patrick, female   DOB: 07-01-1956, 56 y.o.   MRN: [id number]  Patient Demographics  Sandra Patrick, is a 57 y.o. female  WUJ:[id number]  [id number]  DOB - [birth year]  Chief Complaint  Patient presents with  . Follow-up        Subjective:   Sandra Patrick with History of hypothyroidism, hypertension, anxiety, arthritis whose had couple of episodes of syncope in the past and had workup done in this clinic comes in for a followup visit, she recently had a head CT scan and washed with the result of the same. For the last few weeks she has been completely symptom-free. No headaches, no chest abdominal pain, no palpitations, no focal weakness.    Objective:    Patient Active Problem List   Diagnosis Date Noted  . Near syncope 06/25/2013  . Unspecified hypothyroidism 05/28/2013  . Arthritis 05/28/2013  . Anxiety state, unspecified 03/13/2013  . HTN (hypertension) 03/13/2013     Filed Vitals:   07/10/13 1513  BP: 132/81  Pulse: 68  Temp: 98.7 F (37.1 C)  TempSrc: Oral  Resp: 18  Weight: 186 lb (84.369 kg)  SpO2: 97%     Exam   Awake Alert, Oriented X 3, No new F.N deficits, Normal affect Sandra Patrick.AT,PERRAL Supple Neck,No JVD, No cervical lymphadenopathy appriciated.  Symmetrical Chest wall movement, Good air movement bilaterally, CTAB RRR,No Gallops,Rubs or new Murmurs, No Parasternal Heave +ve B.Sounds, Abd Soft, Non tender, No organomegaly appriciated, No rebound - guarding or rigidity. No Cyanosis, Clubbing or edema, No new Rash or bruise      Data Review   CBC No results found for this basename: WBC, HGB, HCT, PLT, MCV, MCH, MCHC, RDW, NEUTRABS, LYMPHSABS, MONOABS, EOSABS, BASOSABS, BANDABS, BANDSABD,  in the last 168 hours  Chemistries   No results found for this basename: NA, K, CL, CO2, GLUCOSE, BUN, CREATININE, GFRCGP, CALCIUM, MG, AST, ALT, ALKPHOS, BILITOT,  in the last 168  hours ------------------------------------------------------------------------------------------------------------------ No results found for this basename: HGBA1C,  in the last 72 hours ------------------------------------------------------------------------------------------------------------------ No results found for this basename: CHOL, HDL, LDLCALC, TRIG, CHOLHDL, LDLDIRECT,  in the last 72 hours ------------------------------------------------------------------------------------------------------------------ No results found for this basename: TSH, T4TOTAL, FREET3, T3FREE, THYROIDAB,  in the last 72 hours ------------------------------------------------------------------------------------------------------------------ No results found for this basename: VITAMINB12, FOLATE, FERRITIN, TIBC, IRON, RETICCTPCT,  in the last 72 hours  Coagulation profile  No results found for this basename: INR, PROTIME,  in the last 168 hours     Prior to Admission medications   Medication Sig Start Date End Date Taking? Authorizing Provider  amLODipine (NORVASC) 10 MG tablet Take 1 tablet (10 mg total) by mouth daily. 05/28/13  Yes Jeanann Lewandowsky, MD  clonazePAM (KLONOPIN) 1 MG tablet Take 1 tablet (1 mg total) by mouth 2 (two) times daily as needed for anxiety. 06/22/13  Yes Alison Murray, MD  hydrochlorothiazide (HYDRODIURIL) 25 MG tablet Take 1 tablet (25 mg total) by mouth daily. 05/28/13  Yes Jeanann Lewandowsky, MD  levothyroxine (SYNTHROID, LEVOTHROID) 50 MCG tablet Take 1 tablet (50 mcg total) by mouth daily. 05/28/13  Yes Jeanann Lewandowsky, MD  atorvastatin (LIPITOR) 10 MG tablet Take 1 tablet (10 mg total) by mouth daily. 07/10/13   Leroy Sea, MD  FLUoxetine (PROZAC) 20 MG capsule Take 20 mg by mouth daily as needed (per md if sluggish).    Historical Provider, MD     Assessment & Plan    1. Hypertension stable continue present regimen  of Norvasc and HCTZ.   2. Dyslipidemia noted  twice. Started on statin.   3. Depression- Anxiety continue Prozac and Klonopin.   4. Syncope. Happened in the past currently symptom-free, not orthostatic, head CT unremarkable, TSH and basic labs stable. EKG done last visit was stable, We'll schedule her for outpatient echo gram, carotid duplex along with cardiology consultation. She might require Holter monitoring along with more cardiac testing based on cardiologist input.     Routine health maintenance.  Screening labs. CBC, CMP, TSH, A1c, lipid panel done recently noted  Lab Results  Component Value Date   WBC 8.1 06/20/2013   HGB 14.6 06/20/2013   HCT 44.2 06/20/2013   MCV 92.5 06/20/2013   PLT 190 06/20/2013      Chemistry      Component Value Date/Time   NA 141 06/20/2013 1608   K 4.4 06/20/2013 1608   CL 107 06/20/2013 1608   CO2 24 06/20/2013 1608   BUN 13 06/20/2013 1608   CREATININE 1.00 06/20/2013 1608      Component Value Date/Time   CALCIUM 8.9 06/20/2013 1608   ALKPHOS 79 02/04/2008 1959   AST 18 02/04/2008 1959   ALT 25 02/04/2008 1959   BILITOT 0.5 02/04/2008 1959      Lab Results  Component Value Date   CHOL 211* 06/22/2013   HDL 51 06/22/2013   LDLCALC 123* 06/22/2013   TRIG 186* 06/22/2013   CHOLHDL 4.1 06/22/2013    Lab Results  Component Value Date   HGBA1C 5.9 % 03/13/2013    Lab Results  Component Value Date   TSH 1.955 06/20/2013     Flu shot provided. She has had tetanus shot in the last 10 years.  OB referral made for mammogram and Pap smear  GI referral made for colonoscopy     Leroy Sea, MD

## 2013-07-13 ENCOUNTER — Telehealth: Payer: Self-pay | Admitting: Internal Medicine

## 2013-07-15 ENCOUNTER — Ambulatory Visit (HOSPITAL_COMMUNITY): Payer: No Typology Code available for payment source

## 2013-07-16 ENCOUNTER — Other Ambulatory Visit (HOSPITAL_COMMUNITY): Payer: No Typology Code available for payment source

## 2013-07-17 ENCOUNTER — Ambulatory Visit (HOSPITAL_COMMUNITY)
Admission: RE | Admit: 2013-07-17 | Discharge: 2013-07-17 | Disposition: A | Discharge status: Home / Self Care | Payer: No Typology Code available for payment source | Source: Ambulatory Visit | Attending: Internal Medicine | Admitting: Internal Medicine

## 2013-07-17 DIAGNOSIS — I369 Nonrheumatic tricuspid valve disorder, unspecified: Secondary | ICD-10-CM

## 2013-07-17 DIAGNOSIS — R55 Syncope and collapse: Secondary | ICD-10-CM | POA: Insufficient documentation

## 2013-07-17 DIAGNOSIS — I10 Essential (primary) hypertension: Secondary | ICD-10-CM | POA: Insufficient documentation

## 2013-07-17 NOTE — Progress Notes (Signed)
  Echocardiogram 2D Echocardiogram has been performed.  Sandra Patrick FRANCES 07/17/2013, 2:08 PM

## 2013-07-17 NOTE — Progress Notes (Signed)
*  PRELIMINARY RESULTS* Vascular Ultrasound Carotid Duplex (Doppler) has been completed.  Preliminary findings: Bilateral:  1-39% ICA stenosis.  Vertebral artery flow is antegrade.      Farrel Demark, RDMS, RVT  07/17/2013, 1:49 PM

## 2013-07-17 NOTE — Progress Notes (Signed)
*  PRELIMINARY RESULTS* Vascular Ultrasound Carotid Duplex (Doppler) has been completed.  Preliminary findings: Bilateral:  1-39% ICA stenosis.  Vertebral artery flow is antegrade.      EUNICE, Climmie Cronce 07/17/2013, 4:20 PM

## 2013-07-22 ENCOUNTER — Ambulatory Visit: Payer: No Typology Code available for payment source | Attending: Cardiology | Admitting: Cardiology

## 2013-07-22 ENCOUNTER — Ambulatory Visit: Payer: No Typology Code available for payment source | Admitting: Cardiology

## 2013-07-22 ENCOUNTER — Encounter: Payer: Self-pay | Admitting: Cardiology

## 2013-07-22 VITALS — BP 144/81 | HR 70 | Temp 98.6°F | Resp 18 | Wt 184.0 lb

## 2013-07-22 DIAGNOSIS — R002 Palpitations: Secondary | ICD-10-CM | POA: Insufficient documentation

## 2013-07-22 DIAGNOSIS — R55 Syncope and collapse: Secondary | ICD-10-CM | POA: Insufficient documentation

## 2013-07-22 DIAGNOSIS — E039 Hypothyroidism, unspecified: Secondary | ICD-10-CM

## 2013-07-22 DIAGNOSIS — I10 Essential (primary) hypertension: Secondary | ICD-10-CM

## 2013-07-22 DIAGNOSIS — F411 Generalized anxiety disorder: Secondary | ICD-10-CM

## 2013-07-22 MED ORDER — ATORVASTATIN CALCIUM 10 MG PO TABS: 10.0000 mg | ORAL_TABLET | Freq: Every day | ORAL | Status: DC

## 2013-07-22 NOTE — Assessment & Plan Note (Signed)
She is euthyroid by history and exam. No change in medication.

## 2013-07-22 NOTE — Assessment & Plan Note (Signed)
She states she blacks out but I'm not sure. There's been no clear witnessing or  objective documentation. EKG, blood work, and echocardiogram are unremarkable. She has minimal carotid plaque which would not be associated with syncope.  I reassured her. I think much of this is anxiety and psychosomatic. I have encouraged her to take her statin which she had not filled. I've also put her on 8 enteric-coated aspirin 81 mg a day. We will see her back in the cardiology clinic when necessary.

## 2013-07-22 NOTE — Progress Notes (Signed)
Pt is here for a f/u and go over recent echocardiogram Voices no new concerns... Alert w/no signs of acute distress.

## 2013-07-22 NOTE — Assessment & Plan Note (Signed)
Continue amlodipine HCTZ.

## 2013-07-22 NOTE — Progress Notes (Signed)
HPI Sandra Patrick returns today for consultation by Dr.Singh for palpitations and syncope.  She has remote history of this per her history. I have no records. Over the last 3 March she has fainted 3 times. These are spontaneous and are associated with some palpitations. She denies any angina or ischemic symptoms. There's no other symptoms. It is not clear if these have been witnessed. She's only out for a brief time she thinks. She is mentally clear when she comes to. There no other neurological symptoms associated with this.  Blood work is normal. EKG was normal. Echocardiogram is also normal with ejection fraction 6065% with trivial mitral regurgitation and borderline LVH. Carotid Doppler showed minimal plaque in the carotids with antegrade flow in the vertebrals.  She has multiple other complaints today. She clearly has some issues with anxiety and perhaps some depression.  Past Medical History  Diagnosis Date  . Hypertension   . Irregular heart beat   . Restless leg syndrome   . Asthma   . Thyroid disease     Current Outpatient Prescriptions  Medication Sig Dispense Refill  . amLODipine (NORVASC) 10 MG tablet Take 1 tablet (10 mg total) by mouth daily.  30 tablet  3  . clonazePAM (KLONOPIN) 1 MG tablet Take 1 tablet (1 mg total) by mouth 2 (two) times daily as needed for anxiety.  45 tablet  0  . FLUoxetine (PROZAC) 20 MG capsule Take 20 mg by mouth daily as needed (per md if sluggish).      . hydrochlorothiazide (HYDRODIURIL) 25 MG tablet Take 1 tablet (25 mg total) by mouth daily.  30 tablet  3  . levothyroxine (SYNTHROID, LEVOTHROID) 50 MCG tablet Take 1 tablet (50 mcg total) by mouth daily.  90 tablet  3  . atorvastatin (LIPITOR) 10 MG tablet Take 1 tablet (10 mg total) by mouth daily.  90 tablet  1   No current facility-administered medications for this visit.    Allergies  Allergen Reactions  . Chocolate Nausea Only and Other (See Comments)    Face feels puffy  . Ceclor  [Cefaclor] Rash  . Codeine Swelling and Rash  . Penicillins Rash  . Sulfa Antibiotics Rash  . Zithromax [Azithromycin] Rash    Family History  Problem Relation Age of Onset  . Heart disease Mother   . Heart disease Father   . Diabetes Brother   . Arthritis Brother     History   Social History  . Marital Status: Divorced    Spouse Name: N/A    Number of Children: N/A  . Years of Education: N/A   Occupational History  . Not on file.   Social History Main Topics  . Smoking status: Never Smoker   . Smokeless tobacco: Not on file  . Alcohol Use: No  . Drug Use: No  . Sexual Activity: Yes   Other Topics Concern  . Not on file   Social History Narrative  . No narrative on file    ROS ALL NEGATIVE EXCEPT THOSE NOTED IN HPI  PE  General Appearance: well developed, well nourished in no acute distress HEENT: symmetrical face, PERRLA, good dentition  Neck: no JVD, thyromegaly, or adenopathy, trachea midline Chest: symmetric without deformity Cardiac: PMI non-displaced, RRR, normal S1, S2, no gallop or murmur Lung: clear to ausculation and percussion Vascular: all pulses full without bruits  Abdominal: nondistended, nontender, good bowel sounds, no HSM, no bruits Extremities: no cyanosis, clubbing or edema, no sign of DVT, no  varicosities  Skin: normal color, no rashes Neuro: alert and oriented x 3, non-focal Pysch: normal affect  EKG Normal sinus rhythm, normal intervals, normal EKG BMET    Component Value Date/Time   NA 141 06/20/2013 1608   K 4.4 06/20/2013 1608   CL 107 06/20/2013 1608   CO2 24 06/20/2013 1608   GLUCOSE 110* 06/20/2013 1608   BUN 13 06/20/2013 1608   CREATININE 1.00 06/20/2013 1608   CALCIUM 8.9 06/20/2013 1608   GFRNONAA 62* 06/20/2013 1608   GFRAA 72* 06/20/2013 1608    Lipid Panel     Component Value Date/Time   CHOL 211* 06/22/2013 1127   TRIG 186* 06/22/2013 1127   HDL 51 06/22/2013 1127   CHOLHDL 4.1 06/22/2013 1127   VLDL 37 06/22/2013  1127   LDLCALC 123* 06/22/2013 1127    CBC    Component Value Date/Time   WBC 8.1 06/20/2013 1608   RBC 4.78 06/20/2013 1608   HGB 14.6 06/20/2013 1608   HCT 44.2 06/20/2013 1608   PLT 190 06/20/2013 1608   MCV 92.5 06/20/2013 1608   MCH 30.5 06/20/2013 1608   MCHC 33.0 06/20/2013 1608   RDW 13.6 06/20/2013 1608   LYMPHSABS 0.9 06/20/2013 1608   MONOABS 0.3 06/20/2013 1608   EOSABS 0.0 06/20/2013 1608   BASOSABS 0.0 06/20/2013 1608

## 2013-07-29 ENCOUNTER — Ambulatory Visit: Payer: No Typology Code available for payment source | Admitting: Cardiology

## 2013-08-13 ENCOUNTER — Ambulatory Visit: Payer: No Typology Code available for payment source

## 2013-08-28 ENCOUNTER — Ambulatory Visit: Payer: Self-pay

## 2013-09-02 ENCOUNTER — Telehealth: Payer: Self-pay | Admitting: Internal Medicine

## 2013-09-02 NOTE — Telephone Encounter (Signed)
Pt calling about med refill for clonazePAM (KLONOPIN) 1 MG tablet, please f/u with pt. Pt uses [facility] on [address].

## 2013-09-28 ENCOUNTER — Ambulatory Visit: Payer: No Typology Code available for payment source

## 2013-10-07 ENCOUNTER — Telehealth: Payer: Self-pay

## 2013-10-23 ENCOUNTER — Encounter: Payer: Self-pay | Admitting: Medical

## 2013-10-27 ENCOUNTER — Other Ambulatory Visit: Payer: Self-pay

## 2013-10-27 ENCOUNTER — Telehealth: Payer: Self-pay

## 2013-10-27 ENCOUNTER — Ambulatory Visit: Payer: Self-pay

## 2013-10-27 DIAGNOSIS — I10 Essential (primary) hypertension: Secondary | ICD-10-CM

## 2013-10-27 DIAGNOSIS — F411 Generalized anxiety disorder: Secondary | ICD-10-CM

## 2013-10-27 DIAGNOSIS — E039 Hypothyroidism, unspecified: Secondary | ICD-10-CM

## 2013-10-27 MED ORDER — FLUOXETINE HCL 20 MG PO CAPS: 20.0000 mg | ORAL_CAPSULE | Freq: Every day | ORAL | Status: DC | PRN

## 2013-10-27 MED ORDER — ATORVASTATIN CALCIUM 10 MG PO TABS: 10.0000 mg | ORAL_TABLET | Freq: Every day | ORAL | Status: DC

## 2013-10-27 MED ORDER — HYDROCHLOROTHIAZIDE 25 MG PO TABS: 25.0000 mg | ORAL_TABLET | Freq: Every day | ORAL | Status: DC

## 2013-10-27 MED ORDER — LEVOTHYROXINE SODIUM 50 MCG PO TABS: 50.0000 ug | ORAL_TABLET | Freq: Every day | ORAL | Status: DC

## 2013-10-27 MED ORDER — AMLODIPINE BESYLATE 10 MG PO TABS: 10.0000 mg | ORAL_TABLET | Freq: Every day | ORAL | Status: DC

## 2013-10-27 MED ORDER — CLONAZEPAM 1 MG PO TABS: 1.0000 mg | ORAL_TABLET | Freq: Two times a day (BID) | ORAL | Status: DC | PRN

## 2013-10-27 NOTE — Telephone Encounter (Signed)
Patient called needing refills on her medications Per Dr Hyman HopesJegede ok to refill blood pressure meds and thyroid as Well as her klonopin

## 2013-11-17 ENCOUNTER — Ambulatory Visit: Payer: Self-pay

## 2013-12-02 ENCOUNTER — Ambulatory Visit: Payer: Self-pay

## 2013-12-16 ENCOUNTER — Encounter: Payer: Self-pay | Admitting: Internal Medicine

## 2013-12-16 ENCOUNTER — Ambulatory Visit: Payer: No Typology Code available for payment source | Attending: Internal Medicine | Admitting: Internal Medicine

## 2013-12-16 VITALS — BP 150/90 | HR 78 | Temp 98.3°F | Resp 17 | Wt 193.8 lb

## 2013-12-16 DIAGNOSIS — E785 Hyperlipidemia, unspecified: Secondary | ICD-10-CM | POA: Insufficient documentation

## 2013-12-16 DIAGNOSIS — R202 Paresthesia of skin: Secondary | ICD-10-CM

## 2013-12-16 DIAGNOSIS — F411 Generalized anxiety disorder: Secondary | ICD-10-CM | POA: Insufficient documentation

## 2013-12-16 DIAGNOSIS — E039 Hypothyroidism, unspecified: Secondary | ICD-10-CM | POA: Insufficient documentation

## 2013-12-16 DIAGNOSIS — Z760 Encounter for issue of repeat prescription: Secondary | ICD-10-CM | POA: Insufficient documentation

## 2013-12-16 DIAGNOSIS — R200 Anesthesia of skin: Secondary | ICD-10-CM

## 2013-12-16 DIAGNOSIS — I10 Essential (primary) hypertension: Secondary | ICD-10-CM | POA: Insufficient documentation

## 2013-12-16 DIAGNOSIS — R209 Unspecified disturbances of skin sensation: Secondary | ICD-10-CM | POA: Insufficient documentation

## 2013-12-16 LAB — COMPLETE METABOLIC PANEL WITH GFR
ALT: 23 U/L (ref 0–35)
AST: 18 U/L (ref 0–37)
Albumin: 4.2 g/dL (ref 3.5–5.2)
Alkaline Phosphatase: 68 U/L (ref 39–117)
BILIRUBIN TOTAL: 0.5 mg/dL (ref 0.2–1.2)
BUN: 14 mg/dL (ref 6–23)
CO2: 26 meq/L (ref 19–32)
CREATININE: 0.81 mg/dL (ref 0.50–1.10)
Calcium: 9 mg/dL (ref 8.4–10.5)
Chloride: 105 mEq/L (ref 96–112)
GFR, Est Non African American: 82 mL/min
Glucose, Bld: 96 mg/dL (ref 70–99)
Potassium: 4.1 mEq/L (ref 3.5–5.3)
Sodium: 141 mEq/L (ref 135–145)
Total Protein: 6.7 g/dL (ref 6.0–8.3)

## 2013-12-16 LAB — LIPID PANEL
Cholesterol: 203 mg/dL — ABNORMAL HIGH (ref 0–200)
HDL: 49 mg/dL (ref >39)
LDL CALC: 128 mg/dL — AB (ref 0–99)
TRIGLYCERIDES: 129 mg/dL (ref <150)
Total CHOL/HDL Ratio: 4.1 Ratio
VLDL: 26 mg/dL (ref 0–40)

## 2013-12-16 LAB — TSH: TSH: 17.857 u[IU]/mL — ABNORMAL HIGH (ref 0.350–4.500)

## 2013-12-16 MED ORDER — FLUOXETINE HCL 20 MG PO CAPS: 20.0000 mg | ORAL_CAPSULE | Freq: Every day | ORAL | Status: DC | PRN

## 2013-12-16 MED ORDER — LEVOTHYROXINE SODIUM 50 MCG PO TABS: 50.0000 ug | ORAL_TABLET | Freq: Every day | ORAL | Status: DC

## 2013-12-16 MED ORDER — AMLODIPINE BESYLATE 10 MG PO TABS: 10.0000 mg | ORAL_TABLET | Freq: Every day | ORAL | Status: DC

## 2013-12-16 MED ORDER — ATORVASTATIN CALCIUM 10 MG PO TABS: 10.0000 mg | ORAL_TABLET | Freq: Every day | ORAL | Status: DC

## 2013-12-16 MED ORDER — HYDROCHLOROTHIAZIDE 25 MG PO TABS: 25.0000 mg | ORAL_TABLET | Freq: Every day | ORAL | Status: DC

## 2013-12-16 NOTE — Patient Instructions (Signed)
DASH Diet  The DASH diet stands for "Dietary Approaches to Stop Hypertension." It is a healthy eating plan that has been shown to reduce high blood pressure (hypertension) in as little as 14 days, while also possibly providing other significant health benefits. These other health benefits include reducing the risk of breast cancer after menopause and reducing the risk of type 2 diabetes, heart disease, colon cancer, and stroke. Health benefits also include weight loss and slowing kidney failure in patients with chronic kidney disease.   DIET GUIDELINES  · Limit salt (sodium). Your diet should contain less than 1500 mg of sodium daily.  · Limit refined or processed carbohydrates. Your diet should include mostly whole grains. Desserts and added sugars should be used sparingly.  · Include small amounts of heart-healthy fats. These types of fats include nuts, oils, and tub margarine. Limit saturated and trans fats. These fats have been shown to be harmful in the body.  CHOOSING FOODS   The following food groups are based on a 2000 calorie diet. See your Registered Dietitian for individual calorie needs.  Grains and Grain Products (6 to 8 servings daily)  · Eat More Often: Whole-wheat bread, brown rice, whole-grain or wheat pasta, quinoa, popcorn without added fat or salt (air popped).  · Eat Less Often: White bread, white pasta, white rice, cornbread.  Vegetables (4 to 5 servings daily)  · Eat More Often: Fresh, frozen, and canned vegetables. Vegetables may be raw, steamed, roasted, or grilled with a minimal amount of fat.  · Eat Less Often/Avoid: Creamed or fried vegetables. Vegetables in a cheese sauce.  Fruit (4 to 5 servings daily)  · Eat More Often: All fresh, canned (in natural juice), or frozen fruits. Dried fruits without added sugar. One hundred percent fruit juice (½ cup [237 mL] daily).  · Eat Less Often: Dried fruits with added sugar. Canned fruit in light or heavy syrup.  Lean Meats, Fish, and Poultry (2  servings or less daily. One serving is 3 to 4 oz [85-114 g]).  · Eat More Often: Ninety percent or leaner ground beef, tenderloin, sirloin. Round cuts of beef, chicken breast, turkey breast. All fish. Grill, bake, or broil your meat. Nothing should be fried.  · Eat Less Often/Avoid: Fatty cuts of meat, turkey, or chicken leg, thigh, or wing. Fried cuts of meat or fish.  Dairy (2 to 3 servings)  · Eat More Often: Low-fat or fat-free milk, low-fat plain or light yogurt, reduced-fat or part-skim cheese.  · Eat Less Often/Avoid: Milk (whole, 2%). Whole milk yogurt. Full-fat cheeses.  Nuts, Seeds, and Legumes (4 to 5 servings per week)  · Eat More Often: All without added salt.  · Eat Less Often/Avoid: Salted nuts and seeds, canned beans with added salt.  Fats and Sweets (limited)  · Eat More Often: Vegetable oils, tub margarines without trans fats, sugar-free gelatin. Mayonnaise and salad dressings.  · Eat Less Often/Avoid: Coconut oils, palm oils, butter, stick margarine, cream, half and half, cookies, candy, pie.  FOR MORE INFORMATION  The Dash Diet Eating Plan: www.dashdiet.org  Document Released: 09/13/2011 Document Revised: 12/17/2011 Document Reviewed: 09/13/2011  ExitCare® Patient Information ©2014 [employer].

## 2013-12-16 NOTE — Progress Notes (Signed)
Patient complains of pain to her left hand and some numbness Has had surgery in the past for carpal tunnel States is getting worse and beginning to affect her job

## 2013-12-16 NOTE — Progress Notes (Signed)
MRN: [id number] Name: Sandra Patrick  Sex: female Age: 58 y.o. DOB: Jun 08, 1956  Allergies: Chocolate; Ceclor; Codeine; Penicillins; Sulfa antibiotics; and Zithromax  Chief Complaint  Patient presents with  . Hand Pain    HPI: Patient is 58 y.o. female who has to of hypertension, hypothyroidism, anxiety comes today and is requesting refill on the medications, she also reported to have left hand numbness and tingling for the last few months, she denies any fall or trauma, as per patient she had carpal tunnel surgery done in the left hand several years ago she is experiencing similar symptoms, she has any fever chills chest pain shortness of breath.  Past Medical History  Diagnosis Date  . Hypertension   . Irregular heart beat   . Restless leg syndrome   . Asthma   . Thyroid disease     Past Surgical History  Procedure Laterality Date  . Eye surgery    . Tonsillectomy    . Cesarean section    . Abdominal hysterectomy    . Carpal tunnel release        Medication List       This list is accurate as of: 12/16/13 10:05 AM.  Always use your most recent med list.               amLODipine 10 MG tablet  Commonly known as:  NORVASC  Take 1 tablet (10 mg total) by mouth daily.     atorvastatin 10 MG tablet  Commonly known as:  LIPITOR  Take 1 tablet (10 mg total) by mouth daily.     clonazePAM 1 MG tablet  Commonly known as:  KLONOPIN  Take 1 tablet (1 mg total) by mouth 2 (two) times daily as needed for anxiety.     FLUoxetine 20 MG capsule  Commonly known as:  PROZAC  Take 1 capsule (20 mg total) by mouth daily as needed (per md if sluggish).     hydrochlorothiazide 25 MG tablet  Commonly known as:  HYDRODIURIL  Take 1 tablet (25 mg total) by mouth daily.     levothyroxine 50 MCG tablet  Commonly known as:  SYNTHROID, LEVOTHROID  Take 1 tablet (50 mcg total) by mouth daily.        Meds ordered this encounter  Medications  . amLODipine (NORVASC) 10 MG  tablet    Sig: Take 1 tablet (10 mg total) by mouth daily.    Dispense:  30 tablet    Refill:  3  . atorvastatin (LIPITOR) 10 MG tablet    Sig: Take 1 tablet (10 mg total) by mouth daily.    Dispense:  90 tablet    Refill:  1  . FLUoxetine (PROZAC) 20 MG capsule    Sig: Take 1 capsule (20 mg total) by mouth daily as needed (per md if sluggish).    Dispense:  10 capsule    Refill:  0  . hydrochlorothiazide (HYDRODIURIL) 25 MG tablet    Sig: Take 1 tablet (25 mg total) by mouth daily.    Dispense:  30 tablet    Refill:  3  . levothyroxine (SYNTHROID, LEVOTHROID) 50 MCG tablet    Sig: Take 1 tablet (50 mcg total) by mouth daily.    Dispense:  90 tablet    Refill:  3    Immunization History  Administered Date(s) Administered  . Influenza Split 07/10/2013    Family History  Problem Relation Age of Onset  . Heart disease Mother   .  Heart disease Father   . Diabetes Brother   . Arthritis Brother     History  Substance Use Topics  . Smoking status: Never Smoker   . Smokeless tobacco: Not on file  . Alcohol Use: No    Review of Systems   As noted in HPI  Filed Vitals:   12/16/13 0958  BP: 150/90  Pulse:   Temp:   Resp:     Physical Exam  Physical Exam  Constitutional: No distress.  Eyes: EOM are normal. Pupils are equal, round, and reactive to light.  Cardiovascular: Normal rate and regular rhythm.   Pulmonary/Chest: Breath sounds normal. No respiratory distress. She has no wheezes. She has no rales.  Musculoskeletal:  Left hand sensation intact to touch, slightly decreased hand grip, no tenderness at the wrist, 2+ radial pulse    CBC    Component Value Date/Time   WBC 8.1 06/20/2013 1608   RBC 4.78 06/20/2013 1608   HGB 14.6 06/20/2013 1608   HCT 44.2 06/20/2013 1608   PLT 190 06/20/2013 1608   MCV 92.5 06/20/2013 1608   LYMPHSABS 0.9 06/20/2013 1608   MONOABS 0.3 06/20/2013 1608   EOSABS 0.0 06/20/2013 1608   BASOSABS 0.0 06/20/2013 1608    CMP       Component Value Date/Time   NA 141 06/20/2013 1608   K 4.4 06/20/2013 1608   CL 107 06/20/2013 1608   CO2 24 06/20/2013 1608   GLUCOSE 110* 06/20/2013 1608   BUN 13 06/20/2013 1608   CREATININE 1.00 06/20/2013 1608   CALCIUM 8.9 06/20/2013 1608   PROT 7.0 02/04/2008 1959   ALBUMIN 4.3 02/04/2008 1959   AST 18 02/04/2008 1959   ALT 25 02/04/2008 1959   ALKPHOS 79 02/04/2008 1959   BILITOT 0.5 02/04/2008 1959   GFRNONAA 62* 06/20/2013 1608   GFRAA 72* 06/20/2013 1608    Lab Results  Component Value Date/Time   CHOL 211* 06/22/2013 11:27 AM    No components found with this basename: hga1c    Lab Results  Component Value Date/Time   AST 18 02/04/2008  7:59 PM    Assessment and Plan  Anxiety state, unspecified Continue with her Prozac 20 mg daily  HTN (hypertension) - Plan: Borderline elevated, advise patient for DASH diet, renew her her blood pressure medications. amLODipine (NORVASC) 10 MG tablet, hydrochlorothiazide (HYDRODIURIL) 25 MG tablet, COMPLETE METABOLIC PANEL WITH GFR  Unspecified hypothyroidism - Plan: levothyroxine (SYNTHROID, LEVOTHROID) 50 MCG tablet, will check TSH level  Numbness and tingling in left hand - Plan: Ambulatory referral to Neurology  Other and unspecified hyperlipidemia - Plan: atorvastatin (LIPITOR) 10 MG tablet, will check Lipid panel   Return in about 3 months (around 03/18/2014) for hypertension.  Doris Cheadle, MD

## 2013-12-18 ENCOUNTER — Telehealth: Payer: Self-pay | Admitting: Internal Medicine

## 2013-12-18 ENCOUNTER — Telehealth: Payer: Self-pay | Admitting: *Deleted

## 2013-12-18 NOTE — Telephone Encounter (Signed)
Message copied by Raynelle CharyWINFREE, Dondi Aime R on Fri Dec 18, 2013 10:05 AM ------      Message from: Doris CheadleADVANI, DEEPAK      Created: Thu Dec 17, 2013  9:20 AM       Blood work reviewed , patient has history of hypothyroidism, recent TSH level is elevated , advise patient to increase the dose of levothyroxine to 75 mcg daily will repeat TSH level on the next visit.Marland Kitchen. ------

## 2013-12-18 NOTE — Telephone Encounter (Signed)
Message copied by Raynelle CharyWINFREE, Kanan Sobek R on Fri Dec 18, 2013 10:29 AM ------      Message from: Doris CheadleADVANI, DEEPAK      Created: Thu Dec 17, 2013  9:20 AM       Blood work reviewed , patient has history of hypothyroidism, recent TSH level is elevated , advise patient to increase the dose of levothyroxine to 75 mcg daily will repeat TSH level on the next visit.Marland Kitchen. ------

## 2013-12-18 NOTE — Telephone Encounter (Signed)
I spoke to the pt and made her aware of her TSH level. I told her to increase her levothyroxine to 75 mcg daily.

## 2013-12-18 NOTE — Telephone Encounter (Signed)
Message copied by Darlis LoanSMITH, Solash Tullo D on Fri Dec 18, 2013  2:26 PM ------      Message from: Doris CheadleADVANI, DEEPAK      Created: Thu Dec 17, 2013  9:20 AM       Blood work reviewed , patient has history of hypothyroidism, recent TSH level is elevated , advise patient to increase the dose of levothyroxine to 75 mcg daily will repeat TSH level on the next visit.Marland Kitchen. ------

## 2013-12-18 NOTE — Telephone Encounter (Signed)
Pt just spoke to clinician and was told that Thyroid meds would have to increased.  Pt would like further explanation for why she needs higher dosage.

## 2013-12-18 NOTE — Telephone Encounter (Signed)
Left message for pt to call for results  

## 2013-12-19 ENCOUNTER — Emergency Department (HOSPITAL_COMMUNITY)
Admission: EM | Admit: 2013-12-19 | Discharge: 2013-12-19 | Disposition: A | Discharge status: Home / Self Care | Payer: No Typology Code available for payment source | Attending: Emergency Medicine | Admitting: Emergency Medicine

## 2013-12-19 ENCOUNTER — Encounter (HOSPITAL_COMMUNITY): Payer: Self-pay | Admitting: Emergency Medicine

## 2013-12-19 DIAGNOSIS — R5383 Other fatigue: Secondary | ICD-10-CM

## 2013-12-19 DIAGNOSIS — R5381 Other malaise: Secondary | ICD-10-CM | POA: Insufficient documentation

## 2013-12-19 DIAGNOSIS — J45901 Unspecified asthma with (acute) exacerbation: Secondary | ICD-10-CM | POA: Insufficient documentation

## 2013-12-19 DIAGNOSIS — Z79899 Other long term (current) drug therapy: Secondary | ICD-10-CM | POA: Insufficient documentation

## 2013-12-19 DIAGNOSIS — J45909 Unspecified asthma, uncomplicated: Secondary | ICD-10-CM | POA: Insufficient documentation

## 2013-12-19 DIAGNOSIS — E039 Hypothyroidism, unspecified: Secondary | ICD-10-CM | POA: Insufficient documentation

## 2013-12-19 DIAGNOSIS — Z880 Allergy status to penicillin: Secondary | ICD-10-CM | POA: Insufficient documentation

## 2013-12-19 DIAGNOSIS — G2581 Restless legs syndrome: Secondary | ICD-10-CM | POA: Insufficient documentation

## 2013-12-19 DIAGNOSIS — I10 Essential (primary) hypertension: Secondary | ICD-10-CM | POA: Insufficient documentation

## 2013-12-19 DIAGNOSIS — R110 Nausea: Secondary | ICD-10-CM | POA: Insufficient documentation

## 2013-12-19 LAB — URINALYSIS, ROUTINE W REFLEX MICROSCOPIC
BILIRUBIN URINE: NEGATIVE
GLUCOSE, UA: NEGATIVE mg/dL
HGB URINE DIPSTICK: NEGATIVE
Ketones, ur: NEGATIVE mg/dL
Leukocytes, UA: NEGATIVE
Nitrite: NEGATIVE
PH: 5 (ref 5.0–8.0)
Protein, ur: NEGATIVE mg/dL
SPECIFIC GRAVITY, URINE: 1.023 (ref 1.005–1.030)
UROBILINOGEN UA: 0.2 mg/dL (ref 0.0–1.0)

## 2013-12-19 NOTE — ED Notes (Signed)
Pt presents to department for evaluation of fatigue and nausea. Ongoing since Thursday. Thinks this could be thyroid issues. Was seen at clinic, reports abnormally high thyroid levels. Pt is alert and oriented x4. No signs of acute distress noted.

## 2013-12-19 NOTE — Discharge Instructions (Signed)
Fatigue °Fatigue is a feeling of tiredness, lack of energy, lack of motivation, or feeling tired all the time. Having enough rest, good nutrition, and reducing stress will normally reduce fatigue. Consult your caregiver if it persists. The nature of your fatigue will help your caregiver to find out its cause. The treatment is based on the cause.  °CAUSES  °There are many causes for fatigue. Most of the time, fatigue can be traced to one or more of your habits or routines. Most causes fit into one or more of three general areas. They are: °Lifestyle problems °· Sleep disturbances. °· Overwork. °· Physical exertion. °· Unhealthy habits. °· Poor eating habits or eating disorders. °· Alcohol and/or drug use . °· Lack of proper nutrition (malnutrition). °Psychological problems °· Stress and/or anxiety problems. °· Depression. °· Grief. °· Boredom. °Medical Problems or Conditions °· Anemia. °· Pregnancy. °· Thyroid gland problems. °· Recovery from major surgery. °· Continuous pain. °· Emphysema or asthma that is not well controlled °· Allergic conditions. °· Diabetes. °· Infections (such as mononucleosis). °· Obesity. °· Sleep disorders, such as sleep apnea. °· Heart failure or other heart-related problems. °· Cancer. °· Kidney disease. °· Liver disease. °· Effects of certain medicines such as antihistamines, cough and cold remedies, prescription pain medicines, heart and blood pressure medicines, drugs used for treatment of cancer, and some antidepressants. °SYMPTOMS  °The symptoms of fatigue include:  °· Lack of energy. °· Lack of drive (motivation). °· Drowsiness. °· Feeling of indifference to the surroundings. °DIAGNOSIS  °The details of how you feel help guide your caregiver in finding out what is causing the fatigue. You will be asked about your present and past health condition. It is important to review all medicines that you take, including prescription and non-prescription items. A thorough exam will be done.  You will be questioned about your feelings, habits, and normal lifestyle. Your caregiver may suggest blood tests, urine tests, or other tests to look for common medical causes of fatigue.  °TREATMENT  °Fatigue is treated by correcting the underlying cause. For example, if you have continuous pain or depression, treating these causes will improve how you feel. Similarly, adjusting the dose of certain medicines will help in reducing fatigue.  °HOME CARE INSTRUCTIONS  °· Try to get the required amount of good sleep every night. °· Eat a healthy and nutritious diet, and drink enough water throughout the day. °· Practice ways of relaxing (including yoga or meditation). °· Exercise regularly. °· Make plans to change situations that cause stress. Act on those plans so that stresses decrease over time. Keep your work and personal routine reasonable. °· Avoid street drugs and minimize use of alcohol. °· Start taking a daily multivitamin after consulting your caregiver. °SEEK MEDICAL CARE IF:  °· You have persistent tiredness, which cannot be accounted for. °· You have fever. °· You have unintentional weight loss. °· You have headaches. °· You have disturbed sleep throughout the night. °· You are feeling sad. °· You have constipation. °· You have dry skin. °· You have gained weight. °· You are taking any new or different medicines that you suspect are causing fatigue. °· You are unable to sleep at night. °· You develop any unusual swelling of your legs or other parts of your body. °SEEK IMMEDIATE MEDICAL CARE IF:  °· You are feeling confused. °· Your vision is blurred. °· You feel faint or pass out. °· You develop severe headache. °· You develop severe abdominal, pelvic, or   back pain.  You develop chest pain, shortness of breath, or an irregular or fast heartbeat.  You are unable to pass a normal amount of urine.  You develop abnormal bleeding such as bleeding from the rectum or you vomit blood.  You have thoughts  about harming yourself or committing suicide.  You are worried that you might harm someone else. MAKE SURE YOU:   Understand these instructions.  Will watch your condition.  Will get help right away if you are not doing well or get worse. Document Released: 07/22/2007 Document Revised: 12/17/2011 Document Reviewed: 07/22/2007 [facility] Patient Information 2014 [place], [place].  Hypothyroidism The thyroid is a large gland located in the lower front of your neck. The thyroid gland helps control metabolism. Metabolism is how your body handles food. It controls metabolism with the hormone thyroxine. When this gland is underactive (hypothyroid), it produces too little hormone.  CAUSES These include:   Absence or destruction of thyroid tissue.  Goiter due to iodine deficiency.  Goiter due to medications.  Congenital defects (since birth).  Problems with the pituitary. This causes a lack of TSH (thyroid stimulating hormone). This hormone tells the thyroid to turn out more hormone. SYMPTOMS  Lethargy (feeling as though you have no energy)  Cold intolerance  Weight gain (in spite of normal food intake)  Dry skin  Coarse hair  Menstrual irregularity (if severe, may lead to infertility)  Slowing of thought processes Cardiac problems are also caused by insufficient amounts of thyroid hormone. Hypothyroidism in the newborn is cretinism, and is an extreme form. It is important that this form be treated adequately and immediately or it will lead rapidly to retarded physical and mental development. DIAGNOSIS  To prove hypothyroidism, your caregiver may do blood tests and ultrasound tests. Sometimes the signs are hidden. It may be necessary for your caregiver to watch this illness with blood tests either before or after diagnosis and treatment. TREATMENT  Low levels of thyroid hormone are increased by using synthetic thyroid hormone. This is a safe, effective treatment. It usually takes  about four weeks to gain the full effects of the medication. After you have the full effect of the medication, it will generally take another four weeks for problems to leave. Your caregiver may start you on low doses. If you have had heart problems the dose may be gradually increased. It is generally not an emergency to get rapidly to normal. HOME CARE INSTRUCTIONS   Take your medications as your caregiver suggests. Let your caregiver know of any medications you are taking or start taking. Your caregiver will help you with dosage schedules.  As your condition improves, your dosage needs may increase. It will be necessary to have continuing blood tests as suggested by your caregiver.  Report all suspected medication side effects to your caregiver. SEEK MEDICAL CARE IF: Seek medical care if you develop:  Sweating.  Tremulousness (tremors).  Anxiety.  Rapid weight loss.  Heat intolerance.  Emotional swings.  Diarrhea.  Weakness. SEEK IMMEDIATE MEDICAL CARE IF:  You develop chest pain, an irregular heart beat (palpitations), or a rapid heart beat. MAKE SURE YOU:   Understand these instructions.  Will watch your condition.  Will get help right away if you are not doing well or get worse. Document Released: 09/24/2005 Document Revised: 12/17/2011 Document Reviewed: 05/14/2008 [facility] CenterExitCare Patient Information 2014 [place], [place]LLC.

## 2013-12-19 NOTE — ED Provider Notes (Signed)
CSN: [id number]     Arrival date & time 12/19/13  1453 History   None    Chief Complaint  Patient presents with  . Fatigue     (Consider location/radiation/quality/duration/timing/severity/associated sxs/prior Treatment) HPI Comments: Patient presents to the ER for evaluation of nausea and fatigue. Patient reports that she has been feeling very weak for some time. She was seen in the outpatient clinic 3 days ago and had an elevated TSH. Her Synthroid medication was doubled. Patient continues to have intermittent episodes of feeling very weakened and has had nausea without vomiting. No diarrhea. No fever. She has not had any chest pain, shortness of breath or abdominal pain. She has noticed dry skin, dry hair associated with her symptoms. Patient missed work today because of the fatigue, needs a work note.   Past Medical History  Diagnosis Date  . Hypertension   . Irregular heart beat   . Restless leg syndrome   . Asthma   . Thyroid disease    Past Surgical History  Procedure Laterality Date  . Eye surgery    . Tonsillectomy    . Cesarean section    . Abdominal hysterectomy    . Carpal tunnel release     Family History  Problem Relation Age of Onset  . Heart disease Mother   . Heart disease Father   . Diabetes Brother   . Arthritis Brother    History  Substance Use Topics  . Smoking status: Never Smoker   . Smokeless tobacco: Not on file  . Alcohol Use: No   OB History   Grav Para Term Preterm Abortions TAB SAB Ect Mult Living                 Review of Systems  Constitutional: Positive for fatigue.  Skin:       Dry skin      Allergies  Chocolate; Ceclor; Codeine; Penicillins; Sulfa antibiotics; and Zithromax  Home Medications   Current Outpatient Rx  Name  Route  Sig  Dispense  Refill  . amLODipine (NORVASC) 10 MG tablet   Oral   Take 1 tablet (10 mg total) by mouth daily.   30 tablet   3   . atorvastatin (LIPITOR) 10 MG tablet   Oral   Take 1  tablet (10 mg total) by mouth daily.   90 tablet   1   . clonazePAM (KLONOPIN) 1 MG tablet   Oral   Take 1 tablet (1 mg total) by mouth 2 (two) times daily as needed for anxiety.   45 tablet   0   . FLUoxetine (PROZAC) 20 MG capsule   Oral   Take 1 capsule (20 mg total) by mouth daily as needed (per md if sluggish).   10 capsule   0   . hydrochlorothiazide (HYDRODIURIL) 25 MG tablet   Oral   Take 1 tablet (25 mg total) by mouth daily.   30 tablet   3   . levothyroxine (SYNTHROID, LEVOTHROID) 50 MCG tablet   Oral   Take 1 tablet (50 mcg total) by mouth daily.   90 tablet   3    BP 151/94  Pulse 96  Temp(Src) 98.7 F (37.1 C) (Oral)  Resp 22  Ht 5\' 2"  (1.575 m)  Wt 190 lb (86.183 kg)  BMI 34.74 kg/m2  SpO2 98% Physical Exam  Constitutional: She is oriented to person, place, and time. She appears well-developed and well-nourished. No distress.  HENT:  Head: Normocephalic  and atraumatic.  Right Ear: Hearing normal.  Left Ear: Hearing normal.  Nose: Nose normal.  Mouth/Throat: Oropharynx is clear and moist and mucous membranes are normal.  Eyes: Conjunctivae and EOM are normal. Pupils are equal, round, and reactive to light.  Neck: Normal range of motion. Neck supple.  Cardiovascular: Regular rhythm, S1 normal and S2 normal.  Exam reveals no gallop and no friction rub.   No murmur heard. Pulmonary/Chest: Effort normal and breath sounds normal. No respiratory distress. She exhibits no tenderness.  Abdominal: Soft. Normal appearance and bowel sounds are normal. There is no hepatosplenomegaly. There is no tenderness. There is no rebound, no guarding, no tenderness at McBurney's point and negative Murphy's sign. No hernia.  Musculoskeletal: Normal range of motion.  Neurological: She is alert and oriented to person, place, and time. She has normal strength. No cranial nerve deficit or sensory deficit. Coordination normal. GCS eye subscore is 4. GCS verbal subscore is 5. GCS  motor subscore is 6.  Skin: Skin is warm, dry and intact. No rash noted. No cyanosis.  Psychiatric: She has a normal mood and affect. Her speech is normal and behavior is normal. Thought content normal.    ED Course  Procedures (including critical care time) Labs Review Labs Reviewed - No data to display Imaging Review No results found.   EKG Interpretation   Date/Time:  Saturday December 19 2013 15:18:36 EDT Ventricular Rate:  83 PR Interval:  173 QRS Duration: 84 QT Interval:  356 QTC Calculation: 418 R Axis:   77 Text Interpretation:  Sinus rhythm Low voltage, precordial leads  Borderline T abnormalities, anterior leads No significant change since  last tracing Confirmed by POLLINA  MD, CHRISTOPHER [id number]) on 12/19/2013  3:44:11 PM      MDM   Final diagnoses:  Fatigue, hypothyroidism     Presents to the ER for evaluation of nausea and fatigue. The patient was seen by her primary care provider 3 days ago and had a TSH greater than 17. The remainder of her blood work was unremarkable. It is felt that the patient's fatigue is from her hypothyroidism. An EKG was performed and is within normal limits for her, no change from previous. She is not experiencing any chest pain, heart palpitations. No concern for acute coronary syndrome or other cardiac etiology of her weakness. Urinalysis also obtained. Patient was reassured, will take some time for the increased Synthroid dose to take effect. Followup with her primary care provider for repeat TSH as previously scheduled.    Gilda Crease, MD 12/19/13 [phone]

## 2013-12-25 ENCOUNTER — Other Ambulatory Visit: Payer: Self-pay | Admitting: Emergency Medicine

## 2013-12-25 ENCOUNTER — Encounter (HOSPITAL_COMMUNITY): Payer: Self-pay | Admitting: Emergency Medicine

## 2013-12-25 ENCOUNTER — Encounter: Payer: Self-pay | Admitting: Neurology

## 2013-12-25 ENCOUNTER — Ambulatory Visit (INDEPENDENT_AMBULATORY_CARE_PROVIDER_SITE_OTHER): Payer: No Typology Code available for payment source | Admitting: Neurology

## 2013-12-25 VITALS — BP 124/84 | HR 75 | Ht 62.21 in | Wt 191.4 lb

## 2013-12-25 DIAGNOSIS — R5383 Other fatigue: Principal | ICD-10-CM

## 2013-12-25 DIAGNOSIS — R29898 Other symptoms and signs involving the musculoskeletal system: Secondary | ICD-10-CM | POA: Insufficient documentation

## 2013-12-25 DIAGNOSIS — R51 Headache: Secondary | ICD-10-CM | POA: Insufficient documentation

## 2013-12-25 DIAGNOSIS — M6281 Muscle weakness (generalized): Secondary | ICD-10-CM

## 2013-12-25 DIAGNOSIS — E039 Hypothyroidism, unspecified: Secondary | ICD-10-CM

## 2013-12-25 DIAGNOSIS — Z79899 Other long term (current) drug therapy: Secondary | ICD-10-CM | POA: Insufficient documentation

## 2013-12-25 DIAGNOSIS — J45909 Unspecified asthma, uncomplicated: Secondary | ICD-10-CM | POA: Insufficient documentation

## 2013-12-25 DIAGNOSIS — I10 Essential (primary) hypertension: Secondary | ICD-10-CM | POA: Insufficient documentation

## 2013-12-25 DIAGNOSIS — G2581 Restless legs syndrome: Secondary | ICD-10-CM | POA: Insufficient documentation

## 2013-12-25 DIAGNOSIS — Z880 Allergy status to penicillin: Secondary | ICD-10-CM | POA: Insufficient documentation

## 2013-12-25 DIAGNOSIS — R5381 Other malaise: Secondary | ICD-10-CM | POA: Insufficient documentation

## 2013-12-25 MED ORDER — LEVOTHYROXINE SODIUM 75 MCG PO TABS: 75.0000 ug | ORAL_TABLET | Freq: Every day | ORAL | Status: DC

## 2013-12-25 NOTE — Progress Notes (Signed)
NEUROLOGY CONSULTATION NOTE  Sandra Patrick MRN: [id number] DOB: 1956/08/20  Referring provider: Dr. Jeanann Lewandowsky Primary care provider: Dr. Jeanann Lewandowsky  Reason for consult:  Left hand/arm weakness and pain  Dear Dr Hyman Hopes:  Thank you for your kind referral of Sandra Patrick for consultation of the above symptoms. Although her history is well known to you, please allow me to reiterate it for the purpose of our medical record. Records and images were personally reviewed where available.   HISTORY OF PRESENT ILLNESS: This is a 58 year old left-handed woman with a history of hypertension, hyperlipidemia, hypothyroidism, depression and anxiety, presenting for severe pain and weakness of the left forearm and hand over the past few months.  She lifts boxes at work and has been having difficulty doing this now.  Her left arm feels numb, most noticeable at night.  She describes the pain as an ache, with some tenderness to palpation.  She has noticed that her leg "wants to give out on me," occurring on an off for the past few years.  She has an occasional ache over the top of her foot.  She had left carpal tunnel surgery 7-8 years ago and reports that it did not help much.  She also had right carpal tunnel surgery in the 1990s and reports that this significantly helped.  She denies any right-sided symptoms.  She denies any recent falls or injuries.  No headaches, dizziness, diplopia, dysarthria, dysphagia, neck pain, bowel or bladder dysfunction.  She has chronic low back pain.  She takes prn clonazepam for anxiety and restless leg symptoms.    Laboratory Data: Lab Results  Component Value Date   WBC 8.1 06/20/2013   HGB 14.6 06/20/2013   HCT 44.2 06/20/2013   MCV 92.5 06/20/2013   PLT 190 06/20/2013     Chemistry      Component Value Date/Time   NA 141 12/16/2013 1018   K 4.1 12/16/2013 1018   CL 105 12/16/2013 1018   CO2 26 12/16/2013 1018   BUN 14 12/16/2013 1018   CREATININE 0.81  12/16/2013 1018   CREATININE 1.00 06/20/2013 1608      Component Value Date/Time   CALCIUM 9.0 12/16/2013 1018   ALKPHOS 68 12/16/2013 1018   AST 18 12/16/2013 1018   ALT 23 12/16/2013 1018   BILITOT 0.5 12/16/2013 1018     Lab Results  Component Value Date   HGBA1C 5.9 % 03/13/2013   Lab Results  Component Value Date   TSH 17.857* 12/16/2013     PAST MEDICAL HISTORY: Past Medical History  Diagnosis Date  . Hypertension   . Irregular heart beat   . Restless leg syndrome   . Asthma   . Thyroid disease     PAST SURGICAL HISTORY: Past Surgical History  Procedure Laterality Date  . Eye surgery    . Tonsillectomy    . Cesarean section    . Abdominal hysterectomy    . Carpal tunnel release      MEDICATIONS: Current Outpatient Prescriptions on File Prior to Visit  Medication Sig Dispense Refill  . amLODipine (NORVASC) 10 MG tablet Take 1 tablet (10 mg total) by mouth daily.  30 tablet  3  . atorvastatin (LIPITOR) 10 MG tablet Take 1 tablet (10 mg total) by mouth daily.  90 tablet  1  . clonazePAM (KLONOPIN) 1 MG tablet Take 1 tablet (1 mg total) by mouth 2 (two) times daily as needed for anxiety.  45  tablet  0  . FLUoxetine (PROZAC) 20 MG capsule Take 1 capsule (20 mg total) by mouth daily as needed (per md if sluggish).  10 capsule  0  . hydrochlorothiazide (HYDRODIURIL) 25 MG tablet Take 1 tablet (25 mg total) by mouth daily.  30 tablet  3  . levothyroxine (SYNTHROID, LEVOTHROID) 50 MCG tablet Take 1 tablet (50 mcg total) by mouth daily.  90 tablet  3   No current facility-administered medications on file prior to visit.    ALLERGIES: Allergies  Allergen Reactions  . Chocolate Nausea Only and Other (See Comments)    Face feels puffy  . Ceclor [Cefaclor] Rash  . Codeine Swelling and Rash  . Penicillins Rash  . Sulfa Antibiotics Rash  . Zithromax [Azithromycin] Rash    FAMILY HISTORY: Family History  Problem Relation Age of Onset  . Heart disease Mother   . Heart  disease Father   . Diabetes Brother   . Arthritis Brother     SOCIAL HISTORY: History   Social History  . Marital Status: Divorced    Spouse Name: N/A    Number of Children: N/A  . Years of Education: N/A   Occupational History  . Not on file.   Social History Main Topics  . Smoking status: Never Smoker   . Smokeless tobacco: Not on file  . Alcohol Use: No  . Drug Use: No  . Sexual Activity: Yes   Other Topics Concern  . Not on file   Social History Narrative  . No narrative on file    REVIEW OF SYSTEMS: Constitutional: No fevers, chills, or sweats, + generalized fatigue, no change in appetite Eyes: No visual changes, double vision, eye pain Ear, nose and throat: No hearing loss, ear pain, nasal congestion, sore throat Cardiovascular: No chest pain, palpitations Respiratory:  No shortness of breath at rest or with exertion, wheezes GastrointestinaI: No nausea, vomiting, diarrhea, abdominal pain, fecal incontinence Genitourinary:  No dysuria, urinary retention or frequency Musculoskeletal:  No neck pain, + back pain Integumentary: No rash, pruritus, skin lesions Neurological: as above Psychiatric: No depression, insomnia, +anxiety Endocrine: No palpitations, fatigue, diaphoresis, mood swings, change in appetite, change in weight, increased thirst Hematologic/Lymphatic:  No anemia, purpura, petechiae. Allergic/Immunologic: no itchy/runny eyes, nasal congestion, recent allergic reactions, rashes  PHYSICAL EXAM: Filed Vitals:   12/25/13 0906  BP: 124/84  Pulse: 75   General: No acute distress Head:  Normocephalic/atraumatic Neck: supple, no paraspinal tenderness, full range of motion Back: No paraspinal tenderness Heart: regular rate and rhythm Lungs: Clear to auscultation bilaterally. Vascular: No carotid bruits. Skin/Extremities: No rash, no edema Neurological Exam: Mental status: alert and oriented to person, place, and time, no dysarthria or aphasia, Fund  of knowledge is appropriate.  Recent and remote memory are intact.  Attention and concentration are normal.    Able to name objects and repeat phrases. Cranial nerves: CN I: not tested CN II: pupils equal, round and reactive to light, visual fields intact, fundi unremarkable. CN III, IV, VI:  Left exotropia with full range of motion, no nystagmus, no ptosis CN V: facial sensation intact CN VII: upper and lower face symmetric CN VIII: hearing intact CN IX, X: gag intact, uvula midline CN XI: sternocleidomastoid and trapezius muscles intact CN XII: tongue midline Bulk & Tone: mild right thenar atrophy, no hypothenar atrophy seen.  No fasciculations. Motor: 5/5 on right UE and LE, left LE. 5/5 on left shoulder abduction/adduction, elbow flexion/extension, wrist extension, finger flexion.  4-/5 finger extension and abduction, 4/5 left thumb abduction.  Decreased fine finger movements on left hand. Sensation: intact to light touch, cold, pin, vibration and joint position sense.  No extinction to double simultaneous stimulation.  Romberg test negative Deep Tendon Reflexes: +2 on right biceps, triceps, brachioradialis, both LE. +2 left biceps, brachioradialis, unable to elicit left triceps reflex. Plantar responses: downgoing bilaterally Cerebellar: no incoordination on finger to nose, heel to shin. No dysdiadochokinesia Gait: narrow-based and steady, able to tandem walk adequately. Tremor: none Negative Tinel's and Phalen's signs bilaterally.  IMPRESSION: This is a 58 year old right-handed woman with a history of hypertension, hyperlipidemia, hypothyroidism, presenting with left arm weakness and pain.  Weakness is more notable over the distal left LE, with finger extension and abduction weakness, as well as decreased fine finger movements.  Concern is for lower cervical radiculopathy.  A cervical MRI without contrast and EMG/NCV of the left upper extremity will be ordered.  She will be referred for  physical therapy.  She reports that she has not changed Synthroid dose because she has not spoken to PCP yet, she was instructed to give them a call for instructions.  She will follow-up in 6 weeks.  Thank you for allowing me to participate in the care of this patient. Please do not hesitate to call for any questions or concerns.   Patrcia Dolly, M.D.

## 2013-12-25 NOTE — Patient Instructions (Addendum)
1. MRI cervical spine without contrast April 7@3pm  please arrive 15 minutes prior for check in. [place] 2. EMG/NCV left UE 3.Phyical therapy left hand weakness 4.Follow up after test

## 2013-12-25 NOTE — ED Notes (Signed)
C/o generalized weakness since 1pm.  Pt states, "you get that way with thyroid problems." Also c/o dry throat and R sided headache that started "after 1pm."  No neuro deficits noted on triage exam.

## 2013-12-26 ENCOUNTER — Emergency Department (HOSPITAL_COMMUNITY)
Admission: EM | Admit: 2013-12-26 | Discharge: 2013-12-26 | Disposition: A | Discharge status: Home / Self Care | Payer: No Typology Code available for payment source | Attending: Emergency Medicine | Admitting: Emergency Medicine

## 2013-12-26 DIAGNOSIS — R531 Weakness: Secondary | ICD-10-CM

## 2013-12-26 HISTORY — DX: Headache, unspecified: R51.9

## 2013-12-26 HISTORY — DX: Headache: R51

## 2013-12-26 LAB — URINE MICROSCOPIC-ADD ON

## 2013-12-26 LAB — URINALYSIS, ROUTINE W REFLEX MICROSCOPIC
Glucose, UA: NEGATIVE mg/dL
HGB URINE DIPSTICK: NEGATIVE
KETONES UR: NEGATIVE mg/dL
Nitrite: NEGATIVE
PROTEIN: NEGATIVE mg/dL
Specific Gravity, Urine: 1.022 (ref 1.005–1.030)
UROBILINOGEN UA: 1 mg/dL (ref 0.0–1.0)
pH: 5.5 (ref 5.0–8.0)

## 2013-12-26 LAB — I-STAT CHEM 8, ED
BUN: 12 mg/dL (ref 6–23)
CALCIUM ION: 1.18 mmol/L (ref 1.12–1.23)
CREATININE: 0.9 mg/dL (ref 0.50–1.10)
Chloride: 108 mEq/L (ref 96–112)
GLUCOSE: 125 mg/dL — AB (ref 70–99)
HCT: 44 % (ref 36.0–46.0)
HEMOGLOBIN: 15 g/dL (ref 12.0–15.0)
Potassium: 4 mEq/L (ref 3.7–5.3)
SODIUM: 145 meq/L (ref 137–147)
TCO2: 24 mmol/L (ref 0–100)

## 2013-12-26 NOTE — ED Notes (Signed)
Lab finished at BS 

## 2013-12-26 NOTE — ED Notes (Signed)
Dr. Miller at [facility].  

## 2013-12-26 NOTE — ED Notes (Signed)
Alert, NAD, calm, interactive, skin W&D, resps e/u, speaking in clear complete sentences. (denies: pain, sob, nausea or dizziness), c/o extreme lethargy, onset 1300 today, here recently for similar.

## 2013-12-26 NOTE — ED Provider Notes (Signed)
CSN: [id number]     Arrival date & time 12/25/13  2136 History   First MD Initiated Contact with Patient 12/26/13 0226     Chief Complaint  Patient presents with  . Weakness  . Headache     (Consider location/radiation/quality/duration/timing/severity/associated sxs/prior Treatment) HPI Comments: Pt is a 58 y/o female with hx of hypothyroidism and chronic fatigue - she presents with a c/o feeeling weak and fatigue.  This has been present forabout 15 hours, constant, nothing makes better or worse.  She was recently seen at her PMD's office and told that she needed to have increased dose of synthroid which was increased to 75mcg today.  She has no f/c/n/v/d/urinary sx or any other c/o.  She had a headache when she awoke a short time ago but this has resolved.  She has no cough, no cp, no back pain or swelling.  She has no ha at this time.  Patient is a 58 y.o. female presenting with weakness and headaches. The history is provided by the patient and medical records.  Weakness Associated symptoms include headaches.  Headache   Past Medical History  Diagnosis Date  . Hypertension   . Irregular heart beat   . Restless leg syndrome   . Asthma   . Thyroid disease   . Headache    Past Surgical History  Procedure Laterality Date  . Eye surgery    . Tonsillectomy    . Cesarean section    . Abdominal hysterectomy    . Carpal tunnel release     Family History  Problem Relation Age of Onset  . Heart disease Mother   . Heart disease Father   . Diabetes Brother   . Arthritis Brother    History  Substance Use Topics  . Smoking status: Never Smoker   . Smokeless tobacco: Not on file  . Alcohol Use: No   OB History   Grav Para Term Preterm Abortions TAB SAB Ect Mult Living                 Review of Systems  Neurological: Positive for weakness and headaches.  All other systems reviewed and are negative.      Allergies  Chocolate; Ceclor; Codeine; Penicillins; Sulfa  antibiotics; and Zithromax  Home Medications   Current Outpatient Rx  Name  Route  Sig  Dispense  Refill  . amLODipine (NORVASC) 10 MG tablet   Oral   Take 1 tablet (10 mg total) by mouth daily.   30 tablet   3   . atorvastatin (LIPITOR) 10 MG tablet   Oral   Take 1 tablet (10 mg total) by mouth daily.   90 tablet   1   . clonazePAM (KLONOPIN) 1 MG tablet   Oral   Take 1 mg by mouth at bedtime as needed (restless legs).         Marland Kitchen. FLUoxetine (PROZAC) 20 MG capsule   Oral   Take 20 mg by mouth daily.         . hydrochlorothiazide (HYDRODIURIL) 25 MG tablet   Oral   Take 1 tablet (25 mg total) by mouth daily.   30 tablet   3   . levothyroxine (SYNTHROID, LEVOTHROID) 75 MCG tablet   Oral   Take 1 tablet (75 mcg total) by mouth daily.   90 tablet   3    BP 137/96  Pulse 67  Temp(Src) 99.1 F (37.3 C) (Oral)  Resp 18  Ht 5\' 3"  (  1.6 m)  Wt 198 lb (89.812 kg)  BMI 35.08 kg/m2  SpO2 97% Physical Exam  Nursing note and vitals reviewed. Constitutional: She appears well-developed and well-nourished. No distress.  HENT:  Head: Normocephalic and atraumatic.  Mouth/Throat: Oropharynx is clear and moist. No oropharyngeal exudate.  Eyes: Conjunctivae and EOM are normal. Pupils are equal, round, and reactive to light. Right eye exhibits no discharge. Left eye exhibits no discharge. No scleral icterus.  Neck: Normal range of motion. Neck supple. No JVD present. No thyromegaly present.  Cardiovascular: Normal rate, regular rhythm, normal heart sounds and intact distal pulses.  Exam reveals no gallop and no friction rub.   No murmur heard. Pulmonary/Chest: Effort normal and breath sounds normal. No respiratory distress. She has no wheezes. She has no rales.  Abdominal: Soft. Bowel sounds are normal. She exhibits no distension and no mass. There is no tenderness.  Musculoskeletal: Normal range of motion. She exhibits no edema and no tenderness.  Lymphadenopathy:    She has  no cervical adenopathy.  Neurological: She is alert. Coordination normal.  Neurologic exam:  Speech clear, pupils equal round reactive to light, extraocular movements intact  Normal peripheral visual fields Cranial nerves III through XII normal including no facial droop Follows commands, moves all extremities x4, normal strength to bilateral upper and lower extremities at all major muscle groups including grip Sensation normal to light touch and pinprick Coordination intact, no limb ataxia, finger-nose-finger normal Rapid alternating movements normal No pronator drift Gait normal   Skin: Skin is warm and dry. No rash noted. No erythema.  Psychiatric: She has a normal mood and affect. Her behavior is normal.    ED Course  Procedures (including critical care time) Labs Review Labs Reviewed  URINALYSIS, ROUTINE W REFLEX MICROSCOPIC - Abnormal; Notable for the following:    APPearance CLOUDY (*)    Bilirubin Urine SMALL (*)    Leukocytes, UA TRACE (*)    All other components within normal limits  URINE MICROSCOPIC-ADD ON - Abnormal; Notable for the following:    Squamous Epithelial / LPF MANY (*)    Bacteria, UA MANY (*)    All other components within normal limits  I-STAT CHEM 8, ED - Abnormal; Notable for the following:    Glucose, Bld 125 (*)    All other components within normal limits   Imaging Review No results found.   EKG Interpretation None      MDM   Final diagnoses:  Generalized weakness    Pt has normal neuro exam, VS unremarkable - transient hypertension noted.  Will check istat to eval for h/h and lytes, ua, otherwise, recent TSH >17, synthroid increased appropriately - no acute intervention at this time.  Labs unremarkable, patient stable for discharge  He is oriented increase in her medications, they can continue her testing and treatment in the outpatient setting  Vida Roller, MD 12/26/13 [phone]

## 2013-12-26 NOTE — ED Notes (Signed)
Pt alert, NAD, calm, interactive, steady gait out to d/c desk.

## 2013-12-28 ENCOUNTER — Ambulatory Visit: Payer: No Typology Code available for payment source | Attending: Internal Medicine

## 2013-12-28 VITALS — BP 154/99 | HR 70 | Temp 98.6°F | Resp 16

## 2013-12-28 DIAGNOSIS — I10 Essential (primary) hypertension: Secondary | ICD-10-CM

## 2013-12-28 DIAGNOSIS — E039 Hypothyroidism, unspecified: Secondary | ICD-10-CM

## 2013-12-28 MED ORDER — CLONIDINE HCL 0.1 MG PO TABS
0.1000 mg | ORAL_TABLET | Freq: Once | ORAL | Status: AC
  Administered 2013-12-28: 0.1 mg via ORAL

## 2013-12-28 NOTE — Progress Notes (Signed)
Pt comes in for blood work.  C/o increased fatigue and weakness Levothyroxine increased last week 75 mcg BP 164/100 76 Clonidine 0.2 mg given

## 2013-12-29 ENCOUNTER — Telehealth: Payer: Self-pay | Admitting: Internal Medicine

## 2013-12-29 LAB — T3, FREE: T3 FREE: 2.9 pg/mL (ref 2.3–4.2)

## 2013-12-29 LAB — T4, FREE: FREE T4: 1.15 ng/dL (ref 0.80–1.80)

## 2013-12-29 NOTE — Telephone Encounter (Signed)
Pt. Called inquiring about blood work. Pt. Was told she would get a call today. Please call pt.

## 2013-12-30 ENCOUNTER — Telehealth: Payer: Self-pay | Admitting: Internal Medicine

## 2013-12-30 NOTE — Telephone Encounter (Signed)
Pt calling about lab results  

## 2013-12-31 NOTE — Telephone Encounter (Signed)
Patient called for lab results i explained to her as soon as the dr reviews them  We will call her with the results

## 2014-01-01 ENCOUNTER — Telehealth: Payer: Self-pay

## 2014-01-01 NOTE — Telephone Encounter (Signed)
Returned patient phone call Lab results normal Patient is aware

## 2014-01-01 NOTE — Telephone Encounter (Signed)
Pt calling to see if results are ready.

## 2014-01-04 ENCOUNTER — Ambulatory Visit: Payer: No Typology Code available for payment source | Admitting: Endocrinology

## 2014-01-04 DIAGNOSIS — Z0289 Encounter for other administrative examinations: Secondary | ICD-10-CM

## 2014-01-08 ENCOUNTER — Telehealth: Payer: Self-pay

## 2014-01-08 NOTE — Telephone Encounter (Signed)
Had Dr Hyman HopesJegede review labs Left voice mail that her labs were all normal

## 2014-01-11 ENCOUNTER — Telehealth: Payer: Self-pay

## 2014-01-11 NOTE — Telephone Encounter (Signed)
Returned patient call Would like an appointment to be seen Complains of having  headaches

## 2014-01-12 ENCOUNTER — Encounter: Payer: Self-pay | Admitting: Internal Medicine

## 2014-01-12 ENCOUNTER — Telehealth: Payer: Self-pay | Admitting: Neurology

## 2014-01-12 ENCOUNTER — Ambulatory Visit: Payer: No Typology Code available for payment source | Attending: Internal Medicine | Admitting: Internal Medicine

## 2014-01-12 ENCOUNTER — Telehealth: Payer: Self-pay | Admitting: Internal Medicine

## 2014-01-12 ENCOUNTER — Ambulatory Visit (HOSPITAL_COMMUNITY)
Admission: RE | Admit: 2014-01-12 | Discharge: 2014-01-12 | Disposition: A | Discharge status: Home / Self Care | Payer: No Typology Code available for payment source | Source: Ambulatory Visit | Attending: Internal Medicine | Admitting: Internal Medicine

## 2014-01-12 ENCOUNTER — Ambulatory Visit (HOSPITAL_COMMUNITY)
Admission: RE | Admit: 2014-01-12 | Discharge: 2014-01-12 | Disposition: A | Discharge status: Home / Self Care | Payer: No Typology Code available for payment source | Source: Ambulatory Visit | Attending: Neurology | Admitting: Neurology

## 2014-01-12 ENCOUNTER — Ambulatory Visit: Payer: No Typology Code available for payment source | Attending: Neurology | Admitting: Occupational Therapy

## 2014-01-12 VITALS — BP 151/98 | HR 82 | Temp 98.7°F | Resp 16 | Ht 61.0 in | Wt 194.0 lb

## 2014-01-12 DIAGNOSIS — M129 Arthropathy, unspecified: Secondary | ICD-10-CM | POA: Insufficient documentation

## 2014-01-12 DIAGNOSIS — F329 Major depressive disorder, single episode, unspecified: Secondary | ICD-10-CM | POA: Insufficient documentation

## 2014-01-12 DIAGNOSIS — M51379 Other intervertebral disc degeneration, lumbosacral region without mention of lumbar back pain or lower extremity pain: Secondary | ICD-10-CM | POA: Insufficient documentation

## 2014-01-12 DIAGNOSIS — R29898 Other symptoms and signs involving the musculoskeletal system: Secondary | ICD-10-CM | POA: Insufficient documentation

## 2014-01-12 DIAGNOSIS — M6281 Muscle weakness (generalized): Secondary | ICD-10-CM | POA: Insufficient documentation

## 2014-01-12 DIAGNOSIS — F3289 Other specified depressive episodes: Secondary | ICD-10-CM

## 2014-01-12 DIAGNOSIS — M5137 Other intervertebral disc degeneration, lumbosacral region: Secondary | ICD-10-CM | POA: Insufficient documentation

## 2014-01-12 DIAGNOSIS — IMO0001 Reserved for inherently not codable concepts without codable children: Secondary | ICD-10-CM | POA: Insufficient documentation

## 2014-01-12 DIAGNOSIS — M549 Dorsalgia, unspecified: Secondary | ICD-10-CM

## 2014-01-12 MED ORDER — TRAMADOL HCL 50 MG PO TABS: 50.0000 mg | ORAL_TABLET | Freq: Three times a day (TID) | ORAL | Status: DC | PRN

## 2014-01-12 MED ORDER — CITALOPRAM HYDROBROMIDE 20 MG PO TABS: 20.0000 mg | ORAL_TABLET | Freq: Every day | ORAL | Status: DC

## 2014-01-12 MED ORDER — CYCLOBENZAPRINE HCL 10 MG PO TABS: 10.0000 mg | ORAL_TABLET | Freq: Every day | ORAL | Status: DC

## 2014-01-12 NOTE — Progress Notes (Signed)
Patient ID: Sandra Patrick, female   DOB: [birth year], 58 y.o.   MRN: [id number]   CC:  HPI: 58 year old female with a history of hypertension, depression, chronic back pain, recently evaluated by neurologist,distal left LE, with finger extension and abduction weakness, as well as decreased fine finger movements. She has a C-spine MRI and an EMG scheduled. She is also ordered to have physical therapy. Today she complains of headache for the last couple of weeks. The patient is also complaining of lower back pain but denies any numbness and tingling in her legs denies any stool or urinary incontinence. Patient has an MRI of the C-spine scheduled.  She is also concerned about interaction of Prozac with levothyroxine and therefore has discontinued this medication.   Allergies  Allergen Reactions  . Chocolate Nausea Only and Other (See Comments)    Face feels puffy  . Ceclor [Cefaclor] Rash  . Codeine Swelling and Rash  . Penicillins Rash  . Sulfa Antibiotics Rash  . Zithromax [Azithromycin] Rash   Past Medical History  Diagnosis Date  . Hypertension   . Irregular heart beat   . Restless leg syndrome   . Asthma   . Thyroid disease   . Headache    Current Outpatient Prescriptions on File Prior to Visit  Medication Sig Dispense Refill  . amLODipine (NORVASC) 10 MG tablet Take 1 tablet (10 mg total) by mouth daily.  30 tablet  3  . atorvastatin (LIPITOR) 10 MG tablet Take 1 tablet (10 mg total) by mouth daily.  90 tablet  1  . hydrochlorothiazide (HYDRODIURIL) 25 MG tablet Take 1 tablet (25 mg total) by mouth daily.  30 tablet  3  . levothyroxine (SYNTHROID, LEVOTHROID) 75 MCG tablet Take 1 tablet (75 mcg total) by mouth daily.  90 tablet  3  . clonazePAM (KLONOPIN) 1 MG tablet Take 1 mg by mouth at bedtime as needed (restless legs).       No current facility-administered medications on file prior to visit.   Family History  Problem Relation Age of Onset  . Heart disease Mother   .  Heart disease Father   . Diabetes Brother   . Arthritis Brother    History   Social History  . Marital Status: Divorced    Spouse Name: N/A    Number of Children: N/A  . Years of Education: N/A   Occupational History  . Not on file.   Social History Main Topics  . Smoking status: Never Smoker   . Smokeless tobacco: Not on file  . Alcohol Use: No  . Drug Use: No  . Sexual Activity: Yes   Other Topics Concern  . Not on file   Social History Narrative  . No narrative on file    Review of Systems  Constitutional: Negative for fever, chills, diaphoresis, activity change, appetite change and fatigue.  HENT: Negative for ear pain, nosebleeds, congestion, facial swelling, rhinorrhea, neck pain, neck stiffness and ear discharge.   Eyes: Negative for pain, discharge, redness, itching and visual disturbance.  Respiratory: Negative for cough, choking, chest tightness, shortness of breath, wheezing and stridor.   Cardiovascular: Negative for chest pain, palpitations and leg swelling.  Gastrointestinal: Negative for abdominal distention.  Genitourinary: Negative for dysuria, urgency, frequency, hematuria, flank pain, decreased urine volume, difficulty urinating and dyspareunia.  Musculoskeletal: As in history of present illness.  Neurological: Negative for dizziness, tremors, seizures, syncope, facial asymmetry, speech difficulty, weakness, light-headedness, numbness and headaches.  Hematological: Negative for adenopathy.  Does not bruise/bleed easily.  Psychiatric/Behavioral: Negative for hallucinations, behavioral problems, confusion, dysphoric mood, decreased concentration and agitation.    Objective:   Filed Vitals:   01/12/14 0928  BP: 151/98  Pulse: 82  Temp: 98.7 F (37.1 C)  Resp: 16    Physical Exam  Constitutional: Appears well-developed and well-nourished. No distress.  HENT: Normocephalic. External right and left ear normal. Oropharynx is clear and moist.  Eyes:  Conjunctivae and EOM are normal. PERRLA, no scleral icterus.  Neck: Normal ROM. Neck supple. No JVD. No tracheal deviation. No thyromegaly.  CVS: RRR, S1/S2 +, no murmurs, no gallops, no carotid bruit.  Pulmonary: Effort and breath sounds normal, no stridor, rhonchi, wheezes, rales.  Abdominal: Soft. BS +,  no distension, tenderness, rebound or guarding.  Musculoskeletal: Normal range of motion. No edema and no tenderness.  Lymphadenopathy: No lymphadenopathy noted, cervical, inguinal. Neuro: Alert. Normal reflexes, muscle tone coordination. No cranial nerve deficit. Skin: Skin is warm and dry. No rash noted. Not diaphoretic. No erythema. No pallor.  Psychiatric: Normal mood and affect. Behavior, judgment, thought content normal.   Lab Results  Component Value Date   WBC 8.1 06/20/2013   HGB 15.0 12/26/2013   HCT 44.0 12/26/2013   MCV 92.5 06/20/2013   PLT 190 06/20/2013   Lab Results  Component Value Date   CREATININE 0.90 12/26/2013   BUN 12 12/26/2013   NA 145 12/26/2013   K 4.0 12/26/2013   CL 108 12/26/2013   CO2 26 12/16/2013    Lab Results  Component Value Date   HGBA1C 5.9 % 03/13/2013   Lipid Panel     Component Value Date/Time   CHOL 203* 12/16/2013 1018   TRIG 129 12/16/2013 1018   HDL 49 12/16/2013 1018   CHOLHDL 4.1 12/16/2013 1018   VLDL 26 12/16/2013 1018   LDLCALC 128* 12/16/2013 1018       Assessment and plan:   Patient Active Problem List   Diagnosis Date Noted  . Left hand weakness 12/25/2013  . Near syncope 06/25/2013  . Unspecified hypothyroidism 05/28/2013  . Arthritis 05/28/2013  . Anxiety state, unspecified 03/13/2013  . HTN (hypertension) 03/13/2013   Lower back pain The patient is being evaluated for cervical radiculopathy She had plain x-rays of her lumbar spine last year that showed mild degenerative changes We'll repeat x-rays of her lumbar spine and her hip Start the patient on Flexeril and tramadol   Depression Discontinued Prozac because of  concern about interaction with levothyroxine Switched the patient to Celexa     Patient to follow up as needed  The patient was given clear instructions to go to ER or return to medical center if symptoms don't improve, worsen or new problems develop. The patient verbalized understanding. The patient was told to call to get any lab results if not heard anything in the next week.

## 2014-01-12 NOTE — Progress Notes (Signed)
Pt is here with c/o sudden low back pain after getting out of bed today. non radiating Pt is scheduled for MRI today @ 3pm [facility] C/o migraine h/a Need alternative med for Prozac. Stopped taking 1 week ago

## 2014-01-12 NOTE — Telephone Encounter (Signed)
Please let her know would do cervical first because these are the symptoms she presented with to the office. Thanks

## 2014-01-12 NOTE — Telephone Encounter (Signed)
Pt called wanting to know if she can also have a MRI of her lower back along with her cervical. Please call pt to confirm.

## 2014-01-12 NOTE — Telephone Encounter (Signed)
Please advise 

## 2014-01-13 NOTE — Telephone Encounter (Signed)
Patient notified

## 2014-01-14 ENCOUNTER — Ambulatory Visit: Payer: No Typology Code available for payment source | Admitting: Occupational Therapy

## 2014-01-18 NOTE — Telephone Encounter (Signed)
Patient has called for results of her x-ray

## 2014-01-19 ENCOUNTER — Telehealth: Payer: Self-pay | Admitting: Emergency Medicine

## 2014-01-19 NOTE — Telephone Encounter (Signed)
Left message for pt to call clinic when message received 

## 2014-01-19 NOTE — Telephone Encounter (Signed)
Pt calling for x-ray results done last week.

## 2014-01-19 NOTE — Telephone Encounter (Signed)
Message copied by Darlis LoanSMITH, JILL D on Tue Jan 19, 2014  4:54 PM ------      Message from: Susie CassetteABROL MD, Germain OsgoodNAYANA      Created: Fri Jan 15, 2014  3:25 PM       Please notify patient that the x-ray of the lumbar spine shows degenerative arthritis. X-ray of the pelvis is negative ------

## 2014-01-20 ENCOUNTER — Ambulatory Visit: Payer: No Typology Code available for payment source | Admitting: Occupational Therapy

## 2014-01-20 ENCOUNTER — Telehealth: Payer: Self-pay | Admitting: Emergency Medicine

## 2014-01-20 ENCOUNTER — Telehealth: Payer: Self-pay | Admitting: Internal Medicine

## 2014-01-20 NOTE — Telephone Encounter (Signed)
Pt called returning a call from the nurse  

## 2014-01-20 NOTE — Telephone Encounter (Signed)
Pt calling back, does not know if she needs to come in to see nurse in person.  Please f/u with pt.

## 2014-01-20 NOTE — Telephone Encounter (Signed)
Left message for pt to return call.

## 2014-01-20 NOTE — Telephone Encounter (Signed)
Pt called in requesting imaging results. Negative report given

## 2014-01-20 NOTE — Telephone Encounter (Signed)
Pt. Is calling once again to speak to nurse in regards to call she received yesterday. Please call pt.

## 2014-01-22 ENCOUNTER — Encounter: Payer: No Typology Code available for payment source | Admitting: *Deleted

## 2014-01-25 ENCOUNTER — Encounter: Payer: No Typology Code available for payment source | Admitting: Neurology

## 2014-01-26 ENCOUNTER — Telehealth: Payer: Self-pay | Admitting: Neurology

## 2014-01-26 NOTE — Telephone Encounter (Signed)
noted 

## 2014-01-26 NOTE — Telephone Encounter (Signed)
Pt no showed today's EMG study w/ Dr. Allena KatzPatel. A no show letter has been mailed to the patient / Sandra S.

## 2014-01-27 ENCOUNTER — Encounter: Payer: No Typology Code available for payment source | Admitting: *Deleted

## 2014-01-27 ENCOUNTER — Telehealth: Payer: Self-pay

## 2014-01-29 ENCOUNTER — Encounter: Payer: No Typology Code available for payment source | Admitting: *Deleted

## 2014-02-02 ENCOUNTER — Encounter: Payer: No Typology Code available for payment source | Admitting: Occupational Therapy

## 2014-02-04 ENCOUNTER — Encounter: Payer: No Typology Code available for payment source | Admitting: Occupational Therapy

## 2014-02-08 ENCOUNTER — Ambulatory Visit: Payer: No Typology Code available for payment source | Admitting: Neurology

## 2014-02-10 ENCOUNTER — Telehealth: Payer: Self-pay | Admitting: Neurology

## 2014-02-10 NOTE — Telephone Encounter (Signed)
Pt no showed 02/08/14 f/u w/ Dr. Karel JarvisAquino. No show letter mailed to pt / Sherri S.

## 2014-03-15 ENCOUNTER — Telehealth: Payer: Self-pay | Admitting: Internal Medicine

## 2014-03-15 NOTE — Telephone Encounter (Signed)
Returning pt's call.  LVM.

## 2014-03-18 ENCOUNTER — Encounter: Payer: Self-pay | Admitting: Internal Medicine

## 2014-03-18 ENCOUNTER — Ambulatory Visit: Payer: No Typology Code available for payment source | Attending: Internal Medicine | Admitting: Internal Medicine

## 2014-03-18 VITALS — BP 134/89 | HR 73 | Temp 98.3°F | Resp 16 | Ht 61.0 in | Wt 192.4 lb

## 2014-03-18 DIAGNOSIS — E039 Hypothyroidism, unspecified: Secondary | ICD-10-CM | POA: Insufficient documentation

## 2014-03-18 DIAGNOSIS — G2581 Restless legs syndrome: Secondary | ICD-10-CM | POA: Insufficient documentation

## 2014-03-18 DIAGNOSIS — J309 Allergic rhinitis, unspecified: Secondary | ICD-10-CM | POA: Insufficient documentation

## 2014-03-18 DIAGNOSIS — I10 Essential (primary) hypertension: Secondary | ICD-10-CM | POA: Insufficient documentation

## 2014-03-18 DIAGNOSIS — J302 Other seasonal allergic rhinitis: Secondary | ICD-10-CM

## 2014-03-18 DIAGNOSIS — E785 Hyperlipidemia, unspecified: Secondary | ICD-10-CM | POA: Insufficient documentation

## 2014-03-18 MED ORDER — CLONAZEPAM 1 MG PO TABS: 1.0000 mg | ORAL_TABLET | Freq: Every evening | ORAL | Status: DC | PRN

## 2014-03-18 MED ORDER — AMLODIPINE BESYLATE 10 MG PO TABS: 10.0000 mg | ORAL_TABLET | Freq: Every day | ORAL | Status: DC

## 2014-03-18 MED ORDER — ALBUTEROL SULFATE HFA 108 (90 BASE) MCG/ACT IN AERS: 2.0000 | INHALATION_SPRAY | Freq: Four times a day (QID) | RESPIRATORY_TRACT | Status: DC | PRN

## 2014-03-18 MED ORDER — HYDROCHLOROTHIAZIDE 25 MG PO TABS: 25.0000 mg | ORAL_TABLET | Freq: Every day | ORAL | Status: DC

## 2014-03-18 MED ORDER — LORATADINE 10 MG PO TABS: 10.0000 mg | ORAL_TABLET | Freq: Every day | ORAL | Status: DC

## 2014-03-18 MED ORDER — ATORVASTATIN CALCIUM 10 MG PO TABS: 10.0000 mg | ORAL_TABLET | Freq: Every day | ORAL | Status: DC

## 2014-03-18 NOTE — Progress Notes (Signed)
Patient ID: Sandra Patrick, female   DOB: October 25, 1955, 58 y.o.   MRN: [id number]  CC: follow up  HPI:  Patient presents to clinic today for follow up visit.  She reports compliance with medication regimen.  Patient c/o infrequent headaches that begin on the right side of her head and last all day.  Patient reports that she has been using ibuprofen for pain relief with significant improvement.  She notes increased pressure in right ear and pain with neck ROM with headaches.  She reports that she has noticed SOB and scratchy throat since allergy season.  Patient has a history of asthma. She reports increased nausea that comes when her thyroid disorder is not controlled properly.  Patient presents today with c/o of headaches.  Has tried ibuprofen 800 mg and ice pack with relief.  Has headaches occasionally. Has a history of tumor.  Using right side headache on right side of head with pressure in ears.  Usually last all day.  Pain with range of motion.     Allergies  Allergen Reactions  . Chocolate Nausea Only and Other (See Comments)    Face feels puffy  . Ceclor [Cefaclor] Rash  . Codeine Swelling and Rash  . Penicillins Rash  . Sulfa Antibiotics Rash  . Zithromax [Azithromycin] Rash   Past Medical History  Diagnosis Date  . Hypertension   . Irregular heart beat   . Restless leg syndrome   . Asthma   . Thyroid disease   . Headache    Current Outpatient Prescriptions on File Prior to Visit  Medication Sig Dispense Refill  . amLODipine (NORVASC) 10 MG tablet Take 1 tablet (10 mg total) by mouth daily.  30 tablet  3  . atorvastatin (LIPITOR) 10 MG tablet Take 1 tablet (10 mg total) by mouth daily.  90 tablet  1  . citalopram (CELEXA) 20 MG tablet Take 1 tablet (20 mg total) by mouth daily.  30 tablet  3  . clonazePAM (KLONOPIN) 1 MG tablet Take 1 mg by mouth at bedtime as needed (restless legs).      . cyclobenzaprine (FLEXERIL) 10 MG tablet Take 1 tablet (10 mg total) by mouth at bedtime.   30 tablet  0  . hydrochlorothiazide (HYDRODIURIL) 25 MG tablet Take 1 tablet (25 mg total) by mouth daily.  30 tablet  3  . levothyroxine (SYNTHROID, LEVOTHROID) 75 MCG tablet Take 1 tablet (75 mcg total) by mouth daily.  90 tablet  3  . traMADol (ULTRAM) 50 MG tablet Take 1 tablet (50 mg total) by mouth every 8 (eight) hours as needed.  60 tablet  0   No current facility-administered medications on file prior to visit.   Family History  Problem Relation Age of Onset  . Heart disease Mother   . Heart disease Father   . Diabetes Brother   . Arthritis Brother    History   Social History  . Marital Status: Divorced    Spouse Name: N/A    Number of Children: N/A  . Years of Education: N/A   Occupational History  . Not on file.   Social History Main Topics  . Smoking status: Never Smoker   . Smokeless tobacco: Not on file  . Alcohol Use: No  . Drug Use: No  . Sexual Activity: Yes   Other Topics Concern  . Not on file   Social History Narrative  . No narrative on file   Review of Systems  Constitutional: Negative.  Negative for malaise/fatigue.  Respiratory: Positive for shortness of breath. Negative for cough.   Cardiovascular: Negative.   Gastrointestinal: Positive for heartburn and nausea. Negative for vomiting, abdominal pain, diarrhea, constipation and blood in stool.  Musculoskeletal: Negative.   Neurological: Positive for headaches. Negative for dizziness, tingling, sensory change, speech change and weakness.       Objective:   Filed Vitals:   03/18/14 1116  BP: 134/89  Pulse: 73  Temp: 98.3 F (36.8 C)  Resp: 16   Physical Exam  Vitals reviewed. Constitutional: She is oriented to person, place, and time.  HENT:  Right Ear: External ear normal.  Left Ear: External ear normal.  Mouth/Throat: Oropharynx is clear and moist.  Eyes: Conjunctivae are normal. Pupils are equal, round, and reactive to light. Right eye exhibits no discharge. Left eye exhibits  no discharge.  Neck: Normal range of motion. Neck supple.  Cardiovascular: Normal rate, regular rhythm and normal heart sounds.   Pulmonary/Chest: Effort normal and breath sounds normal. She has no wheezes.  Abdominal: Soft. Bowel sounds are normal.  Lymphadenopathy:    She has no cervical adenopathy.  Neurological: She is alert and oriented to person, place, and time. No cranial nerve deficit. Coordination normal.  Psychiatric: She has a normal mood and affect.     Lab Results  Component Value Date   WBC 8.1 06/20/2013   HGB 15.0 12/26/2013   HCT 44.0 12/26/2013   MCV 92.5 06/20/2013   PLT 190 06/20/2013   Lab Results  Component Value Date   CREATININE 0.90 12/26/2013   BUN 12 12/26/2013   NA 145 12/26/2013   K 4.0 12/26/2013   CL 108 12/26/2013   CO2 26 12/16/2013    Lab Results  Component Value Date   HGBA1C 5.9 % 03/13/2013   Lipid Panel     Component Value Date/Time   CHOL 203* 12/16/2013 1018   TRIG 129 12/16/2013 1018   HDL 49 12/16/2013 1018   CHOLHDL 4.1 12/16/2013 1018   VLDL 26 12/16/2013 1018   LDLCALC 128* 12/16/2013 1018       Assessment and plan:   Sandra Patrick was seen today for no specified reason.  Diagnoses and associated orders for this visit:  HTN (hypertension) Continue current regimen - amLODipine (NORVASC) 10 MG tablet; Take 1 tablet (10 mg total) by mouth daily. - hydrochlorothiazide (HYDRODIURIL) 25 MG tablet; Take 1 tablet (25 mg total) by mouth daily. - CBC; Future  Other and unspecified hyperlipidemia - atorvastatin (LIPITOR) 10 MG tablet; Take 1 tablet (10 mg total) by mouth daily. - Lipid panel; Future - Hemoglobin A1c; Future  Restless leg - clonazePAM (KLONOPIN) 1 MG tablet; Take 1 tablet (1 mg total) by mouth at bedtime as needed (restless legs).  Seasonal allergies - albuterol (PROVENTIL HFA;VENTOLIN HFA) 108 (90 BASE) MCG/ACT inhaler; Inhale 2 puffs into the lungs every 6 (six) hours as needed for wheezing or shortness of  breath. - loratadine (CLARITIN) 10 MG tablet; Take 1 tablet (10 mg total) by mouth daily.  Unspecified hypothyroidism - T4, Free - TSH Will call if changes need to be made to synthroid.  Return in about 1 week (around 03/25/2014) for lab visit, 3 months PCP.      Sandra Commons, NP-C [facility] [phone] 03/19/2014, 11:28 AM

## 2014-03-18 NOTE — Patient Instructions (Signed)
DASH Diet The DASH diet stands for "Dietary Approaches to Stop Hypertension." It is a healthy eating plan that has been shown to reduce high blood pressure (hypertension) in as little as 14 days, while also possibly providing other significant health benefits. These other health benefits include reducing the risk of breast cancer after menopause and reducing the risk of type 2 diabetes, heart disease, colon cancer, and stroke. Health benefits also include weight loss and slowing kidney failure in patients with chronic kidney disease.  DIET GUIDELINES  Limit salt (sodium). Your diet should contain less than 1500 mg of sodium daily.  Limit refined or processed carbohydrates. Your diet should include mostly whole grains. Desserts and added sugars should be used sparingly.  Include small amounts of heart-healthy fats. These types of fats include nuts, oils, and tub margarine. Limit saturated and trans fats. These fats have been shown to be harmful in the body. CHOOSING FOODS  The following food groups are based on a 2000 calorie diet. See your Registered Dietitian for individual calorie needs. Grains and Grain Products (6 to 8 servings daily)  Eat More Often: Whole-wheat bread, brown rice, whole-grain or wheat pasta, quinoa, popcorn without added fat or salt (air popped).  Eat Less Often: White bread, white pasta, white rice, cornbread. Vegetables (4 to 5 servings daily)  Eat More Often: Fresh, frozen, and canned vegetables. Vegetables may be raw, steamed, roasted, or grilled with a minimal amount of fat.  Eat Less Often/Avoid: Creamed or fried vegetables. Vegetables in a cheese sauce. Fruit (4 to 5 servings daily)  Eat More Often: All fresh, canned (in natural juice), or frozen fruits. Dried fruits without added sugar. One hundred percent fruit juice ( cup [237 mL] daily).  Eat Less Often: Dried fruits with added sugar. Canned fruit in light or heavy syrup. Lean Meats, Fish, and Poultry (2  servings or less daily. One serving is 3 to 4 oz [85-114 g]).  Eat More Often: Ninety percent or leaner ground beef, tenderloin, sirloin. Round cuts of beef, chicken breast, turkey breast. All fish. Grill, bake, or broil your meat. Nothing should be fried.  Eat Less Often/Avoid: Fatty cuts of meat, turkey, or chicken leg, thigh, or wing. Fried cuts of meat or fish. Dairy (2 to 3 servings)  Eat More Often: Low-fat or fat-free milk, low-fat plain or light yogurt, reduced-fat or part-skim cheese.  Eat Less Often/Avoid: Milk (whole, 2%).Whole milk yogurt. Full-fat cheeses. Nuts, Seeds, and Legumes (4 to 5 servings per week)  Eat More Often: All without added salt.  Eat Less Often/Avoid: Salted nuts and seeds, canned beans with added salt. Fats and Sweets (limited)  Eat More Often: Vegetable oils, tub margarines without trans fats, sugar-free gelatin. Mayonnaise and salad dressings.  Eat Less Often/Avoid: Coconut oils, palm oils, butter, stick margarine, cream, half and half, cookies, candy, pie. FOR MORE INFORMATION The Dash Diet Eating Plan: www.dashdiet.org Document Released: 09/13/2011 Document Revised: 12/17/2011 Document Reviewed: 09/13/2011 [employer] Patient Information 2014 [employer]. Exercise to Lose Weight Exercise and a healthy diet may help you lose weight. Your doctor may suggest specific exercises. EXERCISE IDEAS AND TIPS  Choose low-cost things you enjoy doing, such as walking, bicycling, or exercising to workout videos.  Take stairs instead of the elevator.  Walk during your lunch break.  Park your car further away from work or school.  Go to a gym or an exercise class.  Start with 5 to 10 minutes of exercise each day. Build up to 30 minutes   of exercise 4 to 6 days a week.  Wear shoes with good support and comfortable clothes.  Stretch before and after working out.  Work out until you breathe harder and your heart beats faster.  Drink extra water when  you exercise.  Do not do so much that you hurt yourself, feel dizzy, or get very short of breath. Exercises that burn about 150 calories:  Running 1  miles in 15 minutes.  Playing volleyball for 45 to 60 minutes.  Washing and waxing a car for 45 to 60 minutes.  Playing touch football for 45 minutes.  Walking 1  miles in 35 minutes.  Pushing a stroller 1  miles in 30 minutes.  Playing basketball for 30 minutes.  Raking leaves for 30 minutes.  Bicycling 5 miles in 30 minutes.  Walking 2 miles in 30 minutes.  Dancing for 30 minutes.  Shoveling snow for 15 minutes.  Swimming laps for 20 minutes.  Walking up stairs for 15 minutes.  Bicycling 4 miles in 15 minutes.  Gardening for 30 to 45 minutes.  Jumping rope for 15 minutes.  Washing windows or floors for 45 to 60 minutes. Document Released: 10/27/2010 Document Revised: 12/17/2011 Document Reviewed: 10/27/2010 [employer] Patient Information 2014 [employer].  

## 2014-03-18 NOTE — Progress Notes (Signed)
Patient here for follow up on HTN. Patient requesting refill on Klonopin.

## 2014-03-19 ENCOUNTER — Other Ambulatory Visit: Payer: Self-pay | Admitting: Internal Medicine

## 2014-03-19 ENCOUNTER — Telehealth: Payer: Self-pay | Admitting: *Deleted

## 2014-03-19 LAB — T4, FREE: Free T4: 0.86 ng/dL (ref 0.80–1.80)

## 2014-03-19 LAB — TSH: TSH: 6.526 u[IU]/mL — ABNORMAL HIGH (ref 0.350–4.500)

## 2014-03-19 NOTE — Telephone Encounter (Signed)
Left message

## 2014-03-19 NOTE — Telephone Encounter (Signed)
Message copied by Raynelle CharyWINFREE, Sophea Rackham R on Fri Mar 19, 2014  4:57 PM ------      Message from: Ambrose FinlandKECK, VALERIE A      Created: Fri Mar 19, 2014  2:53 PM       Call patient and let her know here TSH is still high. Please increase her Synthroid to 88 MCG daily.  Schedule her to come back in 8 weeks for a repeat TSH. Thanks. ------

## 2014-03-23 ENCOUNTER — Telehealth: Payer: Self-pay | Admitting: Internal Medicine

## 2014-03-23 NOTE — Telephone Encounter (Signed)
Pt returning call regarding results, pls f/u with pt.

## 2014-03-24 ENCOUNTER — Telehealth: Payer: Self-pay | Admitting: Emergency Medicine

## 2014-03-24 ENCOUNTER — Other Ambulatory Visit: Payer: Self-pay | Admitting: Emergency Medicine

## 2014-03-24 MED ORDER — LEVOTHYROXINE SODIUM 88 MCG PO TABS: 88.0000 ug | ORAL_TABLET | Freq: Every day | ORAL | Status: DC

## 2014-03-24 NOTE — Telephone Encounter (Signed)
Left message for pt to call for lab results when message received

## 2014-03-25 ENCOUNTER — Other Ambulatory Visit: Payer: No Typology Code available for payment source

## 2014-04-07 ENCOUNTER — Telehealth: Payer: Self-pay | Admitting: Internal Medicine

## 2014-04-07 NOTE — Telephone Encounter (Signed)
Pt says she had a "tyroid spell" yesterday which caused her to leave work.  Pt says she can't make walk-in times and would like to speak to nurse. Also pt waiting hear back about lab results. Please f/u with pt.

## 2014-04-08 ENCOUNTER — Other Ambulatory Visit: Payer: No Typology Code available for payment source

## 2014-04-13 ENCOUNTER — Ambulatory Visit: Payer: No Typology Code available for payment source | Admitting: *Deleted

## 2014-04-13 ENCOUNTER — Ambulatory Visit: Payer: No Typology Code available for payment source | Admitting: Internal Medicine

## 2014-04-13 VITALS — BP 158/98 | HR 66 | Temp 98.3°F | Resp 16

## 2014-04-13 LAB — LIPID PANEL
CHOL/HDL RATIO: 3 ratio
CHOLESTEROL: 163 mg/dL (ref 0–200)
HDL: 54 mg/dL (ref >39)
LDL CALC: 79 mg/dL (ref 0–99)
Triglycerides: 150 mg/dL — ABNORMAL HIGH (ref <150)
VLDL: 30 mg/dL (ref 0–40)

## 2014-04-13 LAB — CBC
HEMATOCRIT: 41.7 % (ref 36.0–46.0)
Hemoglobin: 14.4 g/dL (ref 12.0–15.0)
MCH: 30.3 pg (ref 26.0–34.0)
MCHC: 34.5 g/dL (ref 30.0–36.0)
MCV: 87.8 fL (ref 78.0–100.0)
PLATELETS: 213 10*3/uL (ref 150–400)
RBC: 4.75 MIL/uL (ref 3.87–5.11)
RDW: 14.7 % (ref 11.5–15.5)
WBC: 6.2 10*3/uL (ref 4.0–10.5)

## 2014-04-13 LAB — HEMOGLOBIN A1C
Hgb A1c MFr Bld: 6.1 % — ABNORMAL HIGH (ref <5.7)
Mean Plasma Glucose: 128 mg/dL — ABNORMAL HIGH (ref <117)

## 2014-04-13 MED ORDER — CYCLOBENZAPRINE HCL 10 MG PO TABS: 10.0000 mg | ORAL_TABLET | Freq: Three times a day (TID) | ORAL | Status: DC | PRN

## 2014-04-13 MED ORDER — TRAMADOL HCL 50 MG PO TABS: 50.0000 mg | ORAL_TABLET | Freq: Four times a day (QID) | ORAL | Status: DC | PRN

## 2014-04-13 NOTE — Patient Instructions (Signed)
Arthritis, Nonspecific °Arthritis is inflammation of a joint. This usually means pain, redness, warmth or swelling are present. One or more joints may be involved. There are a number of types of arthritis. Your caregiver may not be able to tell what type of arthritis you have right away. °CAUSES  °The most common cause of arthritis is the wear and tear on the joint (osteoarthritis). This causes damage to the cartilage, which can break down over time. The knees, hips, back and neck are most often affected by this type of arthritis. °Other types of arthritis and common causes of joint pain include: °· Sprains and other injuries near the joint. Sometimes minor sprains and injuries cause pain and swelling that develop hours later. °· Rheumatoid arthritis. This affects hands, feet and knees. It usually affects both sides of your body at the same time. It is often associated with chronic ailments, fever, weight loss and general weakness. °· Crystal arthritis. Gout and pseudo gout can cause occasional acute severe pain, redness and swelling in the foot, ankle, or knee. °· Infectious arthritis. Bacteria can get into a joint through a break in overlying skin. This can cause infection of the joint. Bacteria and viruses can also spread through the blood and affect your joints. °· Drug, infectious and allergy reactions. Sometimes joints can become mildly painful and slightly swollen with these types of illnesses. °SYMPTOMS  °· Pain is the main symptom. °· Your joint or joints can also be red, swollen and warm or hot to the touch. °· You may have a fever with certain types of arthritis, or even feel overall ill. °· The joint with arthritis will hurt with movement. Stiffness is present with some types of arthritis. °DIAGNOSIS  °Your caregiver will suspect arthritis based on your description of your symptoms and on your exam. Testing may be needed to find the type of arthritis: °· Blood and sometimes urine tests. °· X-ray tests  and sometimes CT or MRI scans. °· Removal of fluid from the joint (arthrocentesis) is done to check for bacteria, crystals or other causes. Your caregiver (or a specialist) will numb the area over the joint with a local anesthetic, and use a needle to remove joint fluid for examination. This procedure is only minimally uncomfortable. °· Even with these tests, your caregiver may not be able to tell what kind of arthritis you have. Consultation with a specialist (rheumatologist) may be helpful. °TREATMENT  °Your caregiver will discuss with you treatment specific to your type of arthritis. If the specific type cannot be determined, then the following general recommendations may apply. °Treatment of severe joint pain includes: °· Rest. °· Elevation. °· Anti-inflammatory medication (for example, ibuprofen) may be prescribed. Avoiding activities that cause increased pain. °· Only take over-the-counter or prescription medicines for pain and discomfort as recommended by your caregiver. °· Cold packs over an inflamed joint may be used for 10 to 15 minutes every hour. Hot packs sometimes feel better, but do not use overnight. Do not use hot packs if you are diabetic without your caregiver's permission. °· A cortisone shot into arthritic joints may help reduce pain and swelling. °· Any acute arthritis that gets worse over the next 1 to 2 days needs to be looked at to be sure there is no joint infection. °Long-term arthritis treatment involves modifying activities and lifestyle to reduce joint stress jarring. This can include weight loss. Also, exercise is needed to nourish the joint cartilage and remove waste. This helps keep the muscles   around the joint strong. °HOME CARE INSTRUCTIONS  °· Do not take aspirin to relieve pain if gout is suspected. This elevates uric acid levels. °· Only take over-the-counter or prescription medicines for pain, discomfort or fever as directed by your caregiver. °· Rest the joint as much as  possible. °· If your joint is swollen, keep it elevated. °· Use crutches if the painful joint is in your leg. °· Drinking plenty of fluids may help for certain types of arthritis. °· Follow your caregiver's dietary instructions. °· Try low-impact exercise such as: °¨ Swimming. °¨ Water aerobics. °¨ Biking. °¨ Walking. °· Morning stiffness is often relieved by a warm shower. °· Put your joints through regular range-of-motion. °SEEK MEDICAL CARE IF:  °· You do not feel better in 24 hours or are getting worse. °· You have side effects to medications, or are not getting better with treatment. °SEEK IMMEDIATE MEDICAL CARE IF:  °· You have a fever. °· You develop severe joint pain, swelling or redness. °· Many joints are involved and become painful and swollen. °· There is severe back pain and/or leg weakness. °· You have loss of bowel or bladder control. °Document Released: 11/01/2004 Document Revised: 12/17/2011 Document Reviewed: 11/17/2008 °ExitCare® Patient Information ©2015 [employer]. This information is not intended to replace advice given to you by your health care provider. Make sure you discuss any questions you have with your health care provider. ° °

## 2014-04-13 NOTE — Progress Notes (Unsigned)
Patient presents today to talk about thyroid and c/o lower back pain. Patient states she had thyroid spell last week where she felt really fatigue. Patient thyroid medication was just adjusted to 88mcg daily last month. Patient states she is having pressure on her lower back. 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 Consulted with Dr. Hyman HopesJegede verbal order to reorder Flexaril and Tramadol. Patient states she is not allergic to Tramadol it just makes her sleepy. Informed patient she has to give dosage increase time to work for her thyroid and advised patient we will recheck Thyroid level next month per Cletus GashV. Keck, NP order. Patient verbalized understanding. Annamaria Hellingose,Caddie Randle Renee, RN

## 2014-04-14 ENCOUNTER — Telehealth: Payer: Self-pay | Admitting: *Deleted

## 2014-04-14 NOTE — Telephone Encounter (Signed)
Patient states she is still having back pain. Informed patient she has to give the Flexeril and Tramadol time to work. Patient has only been on this medication for less than 24 hours. Informed patient if she is not better by the weekend to call the office on Monday. Patient verbalized understanding. Annamaria Hellingose,Tekoa Hamor Renee, RN

## 2014-04-15 ENCOUNTER — Telehealth: Payer: Self-pay | Admitting: *Deleted

## 2014-04-15 NOTE — Telephone Encounter (Signed)
Message copied by Aevah Stansbery, LAURENFredderick SeveranceZE L on Thu Apr 15, 2014  9:35 AM ------      Message from: Holland CommonsKECK, VALERIE A      Created: Tue Apr 13, 2014 11:01 PM       Explain to patient that she is prediabetic and she will need to make lifestyle modifications in order to prolong the onset of diabetes and prevent the need for medication management.  She needs to begin a exercise regimen and avoid excess carbohydrates.  Will recheck in 6 months ------

## 2014-04-15 NOTE — Telephone Encounter (Signed)
Left message on patient's mobile VM to return call to review lab results.

## 2014-04-26 NOTE — Telephone Encounter (Signed)
Pt calling for blood work results, pls f/u with pt.

## 2014-04-27 ENCOUNTER — Encounter: Payer: Self-pay | Admitting: *Deleted

## 2014-04-27 DIAGNOSIS — M545 Low back pain: Secondary | ICD-10-CM

## 2014-04-27 NOTE — Patient Instructions (Signed)
Arthritis, Nonspecific °Arthritis is inflammation of a joint. This usually means pain, redness, warmth or swelling are present. One or more joints may be involved. There are a number of types of arthritis. Your caregiver may not be able to tell what type of arthritis you have right away. °CAUSES  °The most common cause of arthritis is the wear and tear on the joint (osteoarthritis). This causes damage to the cartilage, which can break down over time. The knees, hips, back and neck are most often affected by this type of arthritis. °Other types of arthritis and common causes of joint pain include: °· Sprains and other injuries near the joint. Sometimes minor sprains and injuries cause pain and swelling that develop hours later. °· Rheumatoid arthritis. This affects hands, feet and knees. It usually affects both sides of your body at the same time. It is often associated with chronic ailments, fever, weight loss and general weakness. °· Crystal arthritis. Gout and pseudo gout can cause occasional acute severe pain, redness and swelling in the foot, ankle, or knee. °· Infectious arthritis. Bacteria can get into a joint through a break in overlying skin. This can cause infection of the joint. Bacteria and viruses can also spread through the blood and affect your joints. °· Drug, infectious and allergy reactions. Sometimes joints can become mildly painful and slightly swollen with these types of illnesses. °SYMPTOMS  °· Pain is the main symptom. °· Your joint or joints can also be red, swollen and warm or hot to the touch. °· You may have a fever with certain types of arthritis, or even feel overall ill. °· The joint with arthritis will hurt with movement. Stiffness is present with some types of arthritis. °DIAGNOSIS  °Your caregiver will suspect arthritis based on your description of your symptoms and on your exam. Testing may be needed to find the type of arthritis: °· Blood and sometimes urine tests. °· X-ray tests  and sometimes CT or MRI scans. °· Removal of fluid from the joint (arthrocentesis) is done to check for bacteria, crystals or other causes. Your caregiver (or a specialist) will numb the area over the joint with a local anesthetic, and use a needle to remove joint fluid for examination. This procedure is only minimally uncomfortable. °· Even with these tests, your caregiver may not be able to tell what kind of arthritis you have. Consultation with a specialist (rheumatologist) may be helpful. °TREATMENT  °Your caregiver will discuss with you treatment specific to your type of arthritis. If the specific type cannot be determined, then the following general recommendations may apply. °Treatment of severe joint pain includes: °· Rest. °· Elevation. °· Anti-inflammatory medication (for example, ibuprofen) may be prescribed. Avoiding activities that cause increased pain. °· Only take over-the-counter or prescription medicines for pain and discomfort as recommended by your caregiver. °· Cold packs over an inflamed joint may be used for 10 to 15 minutes every hour. Hot packs sometimes feel better, but do not use overnight. Do not use hot packs if you are diabetic without your caregiver's permission. °· A cortisone shot into arthritic joints may help reduce pain and swelling. °· Any acute arthritis that gets worse over the next 1 to 2 days needs to be looked at to be sure there is no joint infection. °Long-term arthritis treatment involves modifying activities and lifestyle to reduce joint stress jarring. This can include weight loss. Also, exercise is needed to nourish the joint cartilage and remove waste. This helps keep the muscles   around the joint strong. °HOME CARE INSTRUCTIONS  °· Do not take aspirin to relieve pain if gout is suspected. This elevates uric acid levels. °· Only take over-the-counter or prescription medicines for pain, discomfort or fever as directed by your caregiver. °· Rest the joint as much as  possible. °· If your joint is swollen, keep it elevated. °· Use crutches if the painful joint is in your leg. °· Drinking plenty of fluids may help for certain types of arthritis. °· Follow your caregiver's dietary instructions. °· Try low-impact exercise such as: °¨ Swimming. °¨ Water aerobics. °¨ Biking. °¨ Walking. °· Morning stiffness is often relieved by a warm shower. °· Put your joints through regular range-of-motion. °SEEK MEDICAL CARE IF:  °· You do not feel better in 24 hours or are getting worse. °· You have side effects to medications, or are not getting better with treatment. °SEEK IMMEDIATE MEDICAL CARE IF:  °· You have a fever. °· You develop severe joint pain, swelling or redness. °· Many joints are involved and become painful and swollen. °· There is severe back pain and/or leg weakness. °· You have loss of bowel or bladder control. °Document Released: 11/01/2004 Document Revised: 12/17/2011 Document Reviewed: 11/17/2008 °ExitCare® Patient Information ©2015 [employer]. This information is not intended to replace advice given to you by your health care provider. Make sure you discuss any questions you have with your health care provider. ° °

## 2014-04-27 NOTE — Progress Notes (Signed)
Patient ID: Mar Daringeresa L Rickles, female   DOB: 21-Jul-1956, 58 y.o.   MRN: [id number] Patient presents to walk in clinic with c/o lower back pain since 04/13/2014. Patient was prescribed Flexaril and Tramadol on 04/13/2014. Patient states medication is not working her back is throbbing and pain is 10/10. Patient states pain has gotten worse the last 2 days. Consulted with Cletus GashV. Keck, NP. Verbal order received for Sports Medicine referral. Informed patient about plan of care. Patient verbalized agreement. Annamaria Hellingose,Amorah Sebring Renee, RN

## 2014-05-14 ENCOUNTER — Ambulatory Visit: Payer: No Typology Code available for payment source | Admitting: Family Medicine

## 2014-05-20 ENCOUNTER — Encounter: Payer: Self-pay | Admitting: Sports Medicine

## 2014-05-20 ENCOUNTER — Other Ambulatory Visit: Payer: Self-pay | Admitting: *Deleted

## 2014-05-20 ENCOUNTER — Ambulatory Visit (INDEPENDENT_AMBULATORY_CARE_PROVIDER_SITE_OTHER): Payer: No Typology Code available for payment source | Admitting: Sports Medicine

## 2014-05-20 VITALS — BP 161/105 | Ht 62.0 in | Wt 190.0 lb

## 2014-05-20 DIAGNOSIS — B372 Candidiasis of skin and nail: Secondary | ICD-10-CM

## 2014-05-20 DIAGNOSIS — M5380 Other specified dorsopathies, site unspecified: Secondary | ICD-10-CM

## 2014-05-20 DIAGNOSIS — M545 Low back pain, unspecified: Secondary | ICD-10-CM

## 2014-05-20 DIAGNOSIS — M62830 Muscle spasm of back: Secondary | ICD-10-CM

## 2014-05-20 MED ORDER — MELOXICAM 15 MG PO TABS: 15.0000 mg | ORAL_TABLET | Freq: Every day | ORAL | Status: DC

## 2014-05-20 MED ORDER — CYCLOBENZAPRINE HCL 10 MG PO TABS: 10.0000 mg | ORAL_TABLET | Freq: Three times a day (TID) | ORAL | Status: DC | PRN

## 2014-05-20 MED ORDER — NYSTATIN DOMESTIC POWD: 1.0000 | Freq: Two times a day (BID) | Status: DC

## 2014-05-20 NOTE — Progress Notes (Signed)
  Sandra Patrick - 58 y.o. female MRN [id number]  Date of birth: 1956/04/18  SUBJECTIVE:  Including CC & ROS.  Patient is an obese 58 year old female past medical history significant for anxiety, hypothyroidism, and uncontrolled hypertension. Patient presents today as a new patient with a complaint of chronic low back pain. Patient describes a history of low back pain for approximately a year. It has been a insidious onset with no known trauma. Describes the pain as a stiffness in the morning and fatigue at the end of her workday. She denies any radiation of her symptoms into her buttocks, hip or lower leg. Denies any numbness or tingling. She works in [employer], [employer] stocking shelves. Reports particularly at the end of the workday she is aching in the low back which is bilateral but right is slightly greater than left. She reports that she was seen by her PCP who performed lumbar x-rays and told her she had arthritis. Patient has not been taking any medication for this problem according to her however her medication list indicates that she is on tramadol  And Flexeril.   ROS: Review of systems otherwise negative except for information present in HPI  HISTORY: Past Medical, Surgical, Social, and Family History Reviewed & Updated per EMR. Pertinent Historical Findings include: Obesity, uncontrolled hypertension, work environment requiring lifting and squatting  DATA REVIEWED: 4 views of the lumbar spine were personally reviewed mild degenerative changes with a small bone spur at L2. 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 No significant disc space narrowing only mild worst at L2-L3.  PHYSICAL EXAM:  VS: BP:161/105 mmHg  HR: bpm  TEMP: ( )  RESP:   HT:5\' 2"  (157.5 cm)   WT:190 lb (86.183 kg)  BMI:34.8 PHYSICAL EXAM: LOW BACK EXAM: General: well nourished, obesity Skin of LE: warm; dry, no rashes, lesions, ecchymosis or erythema. Vascular: radial pulses  2+ bilaterally Neurologically: Sensation to light touch lower extremities equal and intact bilaterally.  Observation: Normal curvature and no kyphosis or lordosis, no scoliosis.  Iliac crests are symmetric, shoulders line symmetrically Palpation:  No step off defects noted in the thoracic or lumbar spine.   Mild to moderate muscle spasm or tenderness along the paraspinal musculature of the lumbar spine. Range of motion:  For flexibility with lumbar flexion and extension. Hamstrings and quadriceps are tight Bilateral weakness with straight leg raise, hip abduction. No significant pain with no radicular symptoms with straight leg raise. Poor core musculature strength with fast fatigue with lumbar flexion. Nerve root intervention  L2 and L3: Normal hip flexion with no weakness. L2, L3, L4: Normal hip abduction bilaterally.  Normal patellar DTR +3 bilaterally L4, L5, S1: Normal hip abduction bilaterally S1 and S2: Normal ankle plantar-flexion bilaterally.  Normal Achilles tendon DTR +3 bilaterally L5: Normal extensor hallucis longus bilaterally  ASSESSMENT & PLAN: See problem based charting & AVS for pt instructions.

## 2014-05-20 NOTE — Telephone Encounter (Signed)
Patient presents today for medication refill and yeast rash under right breast for two days. Consulted with Dr. Hyman HopesJegede. Verbal order received for refill on Flexaril and for Nystatin Powder.

## 2014-05-20 NOTE — Assessment & Plan Note (Signed)
Patient presented today with chronic bilateral low back pain without radiculopathy or sciatica  Recommendations: -Obtain a lumbar back support to wear at work to provide support and stability with lifting and squatting. -Referred patient to: Health physical therapy to work on core musculature stabilization and flexibility -Educated patient about lumbar muscular fatigue from deconditioning, poor core support, and poor flexibility -Also provided patient with anti-inflammatory with Mobic to be used as needed for additional pain control. Patient may continue to use tramadol and muscle relaxer provided by PCP as needed.

## 2014-05-24 ENCOUNTER — Ambulatory Visit: Payer: No Typology Code available for payment source | Admitting: Physical Therapy

## 2014-05-25 ENCOUNTER — Telehealth: Payer: Self-pay | Admitting: Internal Medicine

## 2014-05-25 NOTE — Telephone Encounter (Signed)
Pt. Called requesting a prescription for her lungitis.Marland Kitchen.Marland Kitchen.Marland Kitchen.Marland Kitchen.please call patient

## 2014-05-26 NOTE — Telephone Encounter (Signed)
Pt. Calling again to request a prescription for her lungitis. Please f/u with pt.

## 2014-05-27 ENCOUNTER — Encounter: Payer: Self-pay | Admitting: *Deleted

## 2014-05-27 ENCOUNTER — Other Ambulatory Visit: Payer: Self-pay | Admitting: *Deleted

## 2014-05-27 DIAGNOSIS — J040 Acute laryngitis: Secondary | ICD-10-CM

## 2014-05-27 DIAGNOSIS — H6590 Unspecified nonsuppurative otitis media, unspecified ear: Secondary | ICD-10-CM

## 2014-05-27 MED ORDER — BENZOCAINE-MENTHOL 6-10 MG MT LOZG: 1.0000 | LOZENGE | OROMUCOSAL | Status: DC | PRN

## 2014-05-27 MED ORDER — NEOMYCIN-POLYMYXIN-HC 1 % OT SOLN: 2.0000 [drp] | Freq: Two times a day (BID) | OTIC | Status: DC

## 2014-05-27 NOTE — Progress Notes (Unsigned)
Patient presents to walk in clinic today with c/o voice coming and going for two days, right ear pain. Visual exam of the inside of the right ear is red. Consulted with Dr. Hyman HopesJegede. Verbal order received for throat losengers and Cortisporin Otic 2 drops in the right ear twice a day. Patient verbalized understanding regarding plan of care. Sandra Patrick,Doye Montilla Renee, RN

## 2014-05-27 NOTE — Telephone Encounter (Signed)
Returned patient call. Patient states she has had the symptoms for two days and feels like she has fluid in one of her ears. Informed patient to come in at 2:30 PM for walk-in clinic. Patient verbalized understanding.

## 2014-05-27 NOTE — Patient Instructions (Signed)
Otitis Media Otitis media is redness, soreness, and inflammation of the middle ear. Otitis media may be caused by allergies or, most commonly, by infection. Often it occurs as a complication of the common cold. SIGNS AND SYMPTOMS Symptoms of otitis media may include:  Earache.  Fever.  Ringing in your ear.  Headache.  Leakage of fluid from the ear. DIAGNOSIS To diagnose otitis media, your health care provider will examine your ear with an otoscope. This is an instrument that allows your health care provider to see into your ear in order to examine your eardrum. Your health care provider also will ask you questions about your symptoms. TREATMENT  Typically, otitis media resolves on its own within 3-5 days. Your health care provider may prescribe medicine to ease your symptoms of pain. If otitis media does not resolve within 5 days or is recurrent, your health care provider may prescribe antibiotic medicines if he or she suspects that a bacterial infection is the cause. HOME CARE INSTRUCTIONS   If you were prescribed an antibiotic medicine, finish it all even if you start to feel better.  Take medicines only as directed by your health care provider.  Keep all follow-up visits as directed by your health care provider. SEEK MEDICAL CARE IF:  You have otitis media only in one ear, or bleeding from your nose, or both.  You notice a lump on your neck.  You are not getting better in 3-5 days.  You feel worse instead of better. SEEK IMMEDIATE MEDICAL CARE IF:   You have pain that is not controlled with medicine.  You have swelling, redness, or pain around your ear or stiffness in your neck.  You notice that part of your face is paralyzed.  You notice that the bone behind your ear (mastoid) is tender when you touch it. MAKE SURE YOU:   Understand these instructions.  Will watch your condition.  Will get help right away if you are not doing well or get worse. Document Released:  06/29/2004 Document Revised: 02/08/2014 Document Reviewed: 04/21/2013 [facility] Patient Information 2015 [place], [place]. This information is not intended to replace advice given to you by your health care provider. Make sure you discuss any questions you have with your health care provider. Laryngitis At the top of your windpipe is your voice box. It is the source of your voice. Inside your voice box are 2 bands of muscles called vocal cords. When you breathe, your vocal cords are relaxed and open so that air can get into the lungs. When you decide to say something, these cords come together and vibrate. The sound from these vibrations goes into your throat and comes out through your mouth as sound. Laryngitis is an inflammation of the vocal cords that causes hoarseness, cough, loss of voice, sore throat, and dry throat. Laryngitis can be temporary (acute) or long-term (chronic). Most cases of acute laryngitis improve with time.Chronic laryngitis lasts for more than 3 weeks. CAUSES Laryngitis can often be related to excessive smoking, talking, or yelling, as well as inhalation of toxic fumes and allergies. Acute laryngitis is usually caused by a viral infection, vocal strain, measles or mumps, or bacterial infections. Chronic laryngitis is usually caused by vocal cord strain, vocal cord injury, postnasal drip, growths on the vocal cords, or acid reflux. SYMPTOMS   Cough.  Sore throat.  Dry throat. RISK FACTORS  Respiratory infections.  Exposure to irritating substances, such as cigarette smoke, excessive amounts of alcohol, stomach acids, and workplace chemicals.  Voice trauma, such as vocal cord injury from shouting or speaking too loud. DIAGNOSIS  Your cargiver will perform a physical exam. During the physical exam, your caregiver will examine your throat. The most common sign of laryngitis is hoarseness. Laryngoscopy may be necessary to confirm the diagnosis of this condition. This  procedure allows your caregiver to look into the larynx. HOME CARE INSTRUCTIONS  Drink enough fluids to keep your urine clear or pale yellow.  Rest until you no longer have symptoms or as directed by your caregiver.  Breathe in moist air.  Take all medicine as directed by your caregiver.  Do not smoke.  Talk as little as possible (this includes whispering).  Write on paper instead of talking until your voice is back to normal.  Follow up with your caregiver if your condition has not improved after 10 days. SEEK MEDICAL CARE IF:   You have trouble breathing.  You cough up blood.  You have persistent fever.  You have increasing pain.  You have difficulty swallowing. MAKE SURE YOU:  Understand these instructions.  Will watch your condition.  Will get help right away if you are not doing well or get worse. Document Released: 09/24/2005 Document Revised: 12/17/2011 Document Reviewed: 11/30/2010 [facility] Patient Information 2015 [place]. This information is not intended to replace advice given to you by your health care provider. Make sure you discuss any questions you have with your health care provider.

## 2014-06-01 ENCOUNTER — Ambulatory Visit: Payer: No Typology Code available for payment source | Attending: Internal Medicine | Admitting: Physical Therapy

## 2014-06-01 DIAGNOSIS — IMO0001 Reserved for inherently not codable concepts without codable children: Secondary | ICD-10-CM | POA: Insufficient documentation

## 2014-06-01 DIAGNOSIS — M545 Low back pain, unspecified: Secondary | ICD-10-CM | POA: Insufficient documentation

## 2014-06-01 DIAGNOSIS — I10 Essential (primary) hypertension: Secondary | ICD-10-CM | POA: Insufficient documentation

## 2014-06-01 DIAGNOSIS — M25559 Pain in unspecified hip: Secondary | ICD-10-CM | POA: Insufficient documentation

## 2014-06-01 DIAGNOSIS — F411 Generalized anxiety disorder: Secondary | ICD-10-CM | POA: Insufficient documentation

## 2014-06-01 DIAGNOSIS — G2581 Restless legs syndrome: Secondary | ICD-10-CM | POA: Insufficient documentation

## 2014-06-08 ENCOUNTER — Ambulatory Visit: Payer: No Typology Code available for payment source | Attending: Internal Medicine | Admitting: Physical Therapy

## 2014-06-08 DIAGNOSIS — M545 Low back pain, unspecified: Secondary | ICD-10-CM | POA: Insufficient documentation

## 2014-06-08 DIAGNOSIS — M5380 Other specified dorsopathies, site unspecified: Secondary | ICD-10-CM | POA: Insufficient documentation

## 2014-06-08 DIAGNOSIS — IMO0001 Reserved for inherently not codable concepts without codable children: Secondary | ICD-10-CM | POA: Insufficient documentation

## 2014-06-08 DIAGNOSIS — M25559 Pain in unspecified hip: Secondary | ICD-10-CM | POA: Insufficient documentation

## 2014-06-08 DIAGNOSIS — G2581 Restless legs syndrome: Secondary | ICD-10-CM | POA: Insufficient documentation

## 2014-06-08 DIAGNOSIS — I10 Essential (primary) hypertension: Secondary | ICD-10-CM | POA: Insufficient documentation

## 2014-06-11 ENCOUNTER — Encounter: Payer: No Typology Code available for payment source | Admitting: Physical Therapy

## 2014-06-13 ENCOUNTER — Encounter (HOSPITAL_COMMUNITY): Payer: Self-pay | Admitting: Emergency Medicine

## 2014-06-13 ENCOUNTER — Emergency Department (HOSPITAL_COMMUNITY)
Admission: EM | Admit: 2014-06-13 | Discharge: 2014-06-13 | Disposition: A | Discharge status: Home / Self Care | Payer: No Typology Code available for payment source | Attending: Emergency Medicine | Admitting: Emergency Medicine

## 2014-06-13 DIAGNOSIS — M545 Low back pain, unspecified: Secondary | ICD-10-CM | POA: Insufficient documentation

## 2014-06-13 DIAGNOSIS — M549 Dorsalgia, unspecified: Secondary | ICD-10-CM | POA: Insufficient documentation

## 2014-06-13 DIAGNOSIS — I499 Cardiac arrhythmia, unspecified: Secondary | ICD-10-CM | POA: Insufficient documentation

## 2014-06-13 DIAGNOSIS — I10 Essential (primary) hypertension: Secondary | ICD-10-CM | POA: Insufficient documentation

## 2014-06-13 DIAGNOSIS — G8929 Other chronic pain: Secondary | ICD-10-CM | POA: Insufficient documentation

## 2014-06-13 DIAGNOSIS — Z880 Allergy status to penicillin: Secondary | ICD-10-CM | POA: Insufficient documentation

## 2014-06-13 DIAGNOSIS — Z79899 Other long term (current) drug therapy: Secondary | ICD-10-CM | POA: Insufficient documentation

## 2014-06-13 DIAGNOSIS — G2581 Restless legs syndrome: Secondary | ICD-10-CM | POA: Insufficient documentation

## 2014-06-13 DIAGNOSIS — E079 Disorder of thyroid, unspecified: Secondary | ICD-10-CM | POA: Insufficient documentation

## 2014-06-13 DIAGNOSIS — Z791 Long term (current) use of non-steroidal anti-inflammatories (NSAID): Secondary | ICD-10-CM | POA: Insufficient documentation

## 2014-06-13 DIAGNOSIS — J45909 Unspecified asthma, uncomplicated: Secondary | ICD-10-CM | POA: Insufficient documentation

## 2014-06-13 MED ORDER — HYDROCODONE-ACETAMINOPHEN 5-325 MG PO TABS: 1.0000 | ORAL_TABLET | ORAL | Status: DC | PRN

## 2014-06-13 MED ORDER — PREDNISONE 10 MG PO TABS: ORAL_TABLET | ORAL | Status: DC

## 2014-06-13 MED ORDER — PREDNISONE 20 MG PO TABS
60.0000 mg | ORAL_TABLET | Freq: Once | ORAL | Status: AC
  Administered 2014-06-13: 60 mg via ORAL
  Filled 2014-06-13: qty 3

## 2014-06-13 MED ORDER — HYDROCODONE-ACETAMINOPHEN 5-325 MG PO TABS
1.0000 | ORAL_TABLET | Freq: Once | ORAL | Status: AC
  Administered 2014-06-13: 1 via ORAL
  Filled 2014-06-13: qty 1

## 2014-06-13 NOTE — ED Provider Notes (Signed)
CSN: [id number]     Arrival date & time 06/13/14  1147 History  This chart was scribed for non-physician practitioner, Elpidio Anis, PA-C working with Lyanne Co, MD, by Jarvis Morgan, ED Scribe. This patient was seen in room TR05C/TR05C and the patient's care was started at 1:02 PM.    Chief Complaint  Patient presents with  . Back Pain      The history is provided by the patient. No language interpreter was used.   HPI Comments: Sandra Patrick is a 58 y.o. female who presents to the Emergency Department complaining of constant, moderate, non radiating, "10/10", lower back pain that began around 5 hours ago. No pain yesterday. She denies any injury or heavy lifting. She has had intermittent chronic, back pain for about 6-7 months. She states her PCP says she has arthritis in the lower part of her back but she states she is not convinced its just arthritis, she thinks it may be a disc. Last time she had an x-ray of her back was about 4 months ago. She denies any gait problems, flank pain, dysuria, urinary or bowel incontinence.   Past Medical History  Diagnosis Date  . Hypertension   . Irregular heart beat   . Restless leg syndrome   . Asthma   . Thyroid disease   . Headache    Past Surgical History  Procedure Laterality Date  . Eye surgery    . Tonsillectomy    . Cesarean section    . Abdominal hysterectomy    . Carpal tunnel release     Family History  Problem Relation Age of Onset  . Heart disease Mother   . Heart disease Father   . Diabetes Brother   . Arthritis Brother    History  Substance Use Topics  . Smoking status: Never Smoker   . Smokeless tobacco: Not on file  . Alcohol Use: No   OB History   Grav Para Term Preterm Abortions TAB SAB Ect Mult Living                 Review of Systems  Constitutional: Negative for fever and chills.  Gastrointestinal:       No bowel incontinence  Genitourinary: Negative for dysuria, frequency and flank pain.        No urinary incontinence  Musculoskeletal: Positive for back pain. Negative for gait problem.  All other systems reviewed and are negative.     Allergies  Chocolate; Ceclor; Codeine; Penicillins; Sulfa antibiotics; and Zithromax  Home Medications   Prior to Admission medications   Medication Sig Start Date End Date Taking? Authorizing Provider  albuterol (PROVENTIL HFA;VENTOLIN HFA) 108 (90 BASE) MCG/ACT inhaler Inhale 2 puffs into the lungs every 6 (six) hours as needed for wheezing or shortness of breath. 03/18/14   Ambrose Finland, NP  amLODipine (NORVASC) 10 MG tablet Take 1 tablet (10 mg total) by mouth daily. 03/18/14   Ambrose Finland, NP  atorvastatin (LIPITOR) 10 MG tablet Take 1 tablet (10 mg total) by mouth daily. 03/18/14   Ambrose Finland, NP  benzocaine-menthol (SORE THROAT) 6-10 MG lozenge Take 1 lozenge by mouth as needed for sore throat. 05/27/14   Quentin Angst, MD  citalopram (CELEXA) 20 MG tablet Take 1 tablet (20 mg total) by mouth daily. 01/12/14   Richarda Overlie, MD  clonazePAM (KLONOPIN) 1 MG tablet Take 1 tablet (1 mg total) by mouth at bedtime as needed (restless legs). 03/18/14  Ambrose Finland, NP  cyclobenzaprine (FLEXERIL) 10 MG tablet Take 1 tablet (10 mg total) by mouth 3 (three) times daily as needed for muscle spasms. 05/20/14   Quentin Angst, MD  hydrochlorothiazide (HYDRODIURIL) 25 MG tablet Take 1 tablet (25 mg total) by mouth daily. 03/18/14   Ambrose Finland, NP  levothyroxine (SYNTHROID, LEVOTHROID) 88 MCG tablet Take 1 tablet (88 mcg total) by mouth daily. 03/24/14   Ambrose Finland, NP  loratadine (CLARITIN) 10 MG tablet Take 1 tablet (10 mg total) by mouth daily. 03/18/14   Ambrose Finland, NP  meloxicam (MOBIC) 15 MG tablet Take 1 tablet (15 mg total) by mouth daily. 05/20/14   Deanna M Didiano, DO  NEOMYCIN-POLYMYXIN-HYDROCORTISONE (CORTISPORIN) 1 % SOLN otic solution Place 2 drops into the right ear 2 (two) times daily. 05/27/14   Quentin Angst, MD   Nystatin Domestic POWD 1 each by Does not apply route 2 (two) times daily. 05/20/14   Quentin Angst, MD  traMADol (ULTRAM) 50 MG tablet Take 1 tablet (50 mg total) by mouth every 6 (six) hours as needed. 04/13/14   Quentin Angst, MD   Triage Vitals: BP 178/105  Pulse 94  Temp(Src) 98.5 F (36.9 C) (Oral)  Resp 18  SpO2 98%  Physical Exam  Nursing note and vitals reviewed. Constitutional: She is oriented to person, place, and time. She appears well-developed and well-nourished. No distress.  HENT:  Head: Normocephalic and atraumatic.  Eyes: Conjunctivae and EOM are normal.  Neck: Neck supple. No tracheal deviation present.  Cardiovascular: Normal rate.   Pulmonary/Chest: Effort normal. No respiratory distress.  Abdominal: Soft. Bowel sounds are normal. There is no tenderness.  Musculoskeletal: Normal range of motion.  Lumbar tenderness w/o sciatica. No swelling. reflexes are intact. She is ambulatory.  Neurological: She is alert and oriented to person, place, and time.  Skin: Skin is warm and dry.  Psychiatric: She has a normal mood and affect. Her behavior is normal.    ED Course  Procedures (including critical care time)  DIAGNOSTIC STUDIES: Oxygen Saturation is 98% on RA, normal by my interpretation.    COORDINATION OF CARE:    Labs Review Labs Reviewed - No data to display  Imaging Review No results found.   EKG Interpretation None      MDM   Final diagnoses:  None    Low back pain  No neurologic deficits on exam. She has pain that is worse with movement, better at rest, supporting musculoskeletal origin. Supportive care, pain management, PCP follow up.  I personally performed the services described in this documentation, which was scribed in my presence. The recorded information has been reviewed and is accurate.  \   Arnoldo Hooker, PA-C 06/13/14 1432

## 2014-06-13 NOTE — ED Notes (Signed)
Pt is here with lower back pain that does not radiate.  Pt was told she has arthritis in back and patient thinks it is her disc.  Pt denies radiation of pain or incontinence and ambulatory.

## 2014-06-13 NOTE — Discharge Instructions (Signed)
Cryotherapy °Cryotherapy means treatment with cold. Ice or gel packs can be used to reduce both pain and swelling. Ice is the most helpful within the first 24 to 48 hours after an injury or flare-up from overusing a muscle or joint. Sprains, strains, spasms, burning pain, shooting pain, and aches can all be eased with ice. Ice can also be used when recovering from surgery. Ice is effective, has very few side effects, and is safe for most people to use. °PRECAUTIONS  °Ice is not a safe treatment option for people with: °· Raynaud phenomenon. This is a condition affecting small blood vessels in the extremities. Exposure to cold may cause your problems to return. °· Cold hypersensitivity. There are many forms of cold hypersensitivity, including: °¨ Cold urticaria. Red, itchy hives appear on the skin when the tissues begin to warm after being iced. °¨ Cold erythema. This is a red, itchy rash caused by exposure to cold. °¨ Cold hemoglobinuria. Red blood cells break down when the tissues begin to warm after being iced. The hemoglobin that carry oxygen are passed into the urine because they cannot combine with blood proteins fast enough. °· Numbness or altered sensitivity in the area being iced. °If you have any of the following conditions, do not use ice until you have discussed cryotherapy with your caregiver: °· Heart conditions, such as arrhythmia, angina, or chronic heart disease. °· High blood pressure. °· Healing wounds or open skin in the area being iced. °· Current infections. °· Rheumatoid arthritis. °· Poor circulation. °· Diabetes. °Ice slows the blood flow in the region it is applied. This is beneficial when trying to stop inflamed tissues from spreading irritating chemicals to surrounding tissues. However, if you expose your skin to cold temperatures for too long or without the proper protection, you can damage your skin or nerves. Watch for signs of skin damage due to cold. °HOME CARE INSTRUCTIONS °Follow  these tips to use ice and cold packs safely. °· Place a dry or damp towel between the ice and skin. A damp towel will cool the skin more quickly, so you may need to shorten the time that the ice is used. °· For a more rapid response, add gentle compression to the ice. °· Ice for no more than 10 to 20 minutes at a time. The bonier the area you are icing, the less time it will take to get the benefits of ice. °· Check your skin after 5 minutes to make sure there are no signs of a poor response to cold or skin damage. °· Rest 20 minutes or more between uses. °· Once your skin is numb, you can end your treatment. You can test numbness by very lightly touching your skin. The touch should be so light that you do not see the skin dimple from the pressure of your fingertip. When using ice, most people will feel these normal sensations in this order: cold, burning, aching, and numbness. °· Do not use ice on someone who cannot communicate their responses to pain, such as small children or people with dementia. °HOW TO MAKE AN ICE PACK °Ice packs are the most common way to use ice therapy. Other methods include ice massage, ice baths, and cryosprays. Muscle creams that cause a cold, tingly feeling do not offer the same benefits that ice offers and should not be used as a substitute unless recommended by your caregiver. °To make an ice pack, do one of the following: °· Place crushed ice or a   bag of frozen vegetables in a sealable plastic bag. Squeeze out the excess air. Place this bag inside another plastic bag. Slide the bag into a pillowcase or place a damp towel between your skin and the bag. °· Mix 3 parts water with 1 part rubbing alcohol. Freeze the mixture in a sealable plastic bag. When you remove the mixture from the freezer, it will be slushy. Squeeze out the excess air. Place this bag inside another plastic bag. Slide the bag into a pillowcase or place a damp towel between your skin and the bag. °SEEK MEDICAL CARE  IF: °· You develop white spots on your skin. This may give the skin a blotchy (mottled) appearance. °· Your skin turns blue or pale. °· Your skin becomes waxy or hard. °· Your swelling gets worse. °MAKE SURE YOU:  °· Understand these instructions. °· Will watch your condition. °· Will get help right away if you are not doing well or get worse. °Document Released: 05/21/2011 Document Revised: 02/08/2014 Document Reviewed: 05/21/2011 °Exit[employer]® Patient Information ©2015 [employer]. This information is not intended to replace advice given to you by your health care provider. Make sure you discuss any questions you have with your health care provider. °Back Pain, Adult °Low back pain is very common. About 1 in 5 people have back pain. The cause of low back pain is rarely dangerous. The pain often gets better over time. About half of people with a sudden onset of back pain feel better in just 2 weeks. About 8 in 10 people feel better by 6 weeks.  °CAUSES °Some common causes of back pain include: °· Strain of the muscles or ligaments supporting the spine. °· Wear and tear (degeneration) of the spinal discs. °· Arthritis. °· Direct injury to the back. °DIAGNOSIS °Most of the time, the direct cause of low back pain is not known. However, back pain can be treated effectively even when the exact cause of the pain is unknown. Answering your caregiver's questions about your overall health and symptoms is one of the most accurate ways to make sure the cause of your pain is not dangerous. If your caregiver needs more information, he or she may order lab work or imaging tests (X-rays or MRIs). However, even if imaging tests show changes in your back, this usually does not require surgery. °HOME CARE INSTRUCTIONS °For many people, back pain returns. Since low back pain is rarely dangerous, it is often a condition that people can learn to manage on their own.  °· Remain active. It is stressful on the back to sit or stand in one  place. Do not sit, drive, or stand in one place for more than 30 minutes at a time. Take short walks on level surfaces as soon as pain allows. Try to increase the length of time you walk each day. °· Do not stay in bed. Resting more than 1 or 2 days can delay your recovery. °· Do not avoid exercise or work. Your body is made to move. It is not dangerous to be active, even though your back may hurt. Your back will likely heal faster if you return to being active before your pain is gone. °· Pay attention to your body when you  bend and lift. Many people have less discomfort when lifting if they bend their knees, keep the load close to their bodies, and avoid twisting. Often, the most comfortable positions are those that put less stress on your recovering back. °· Find a comfortable position to sleep. Use a firm mattress and lie on your side with your   knees slightly bent. If you lie on your back, put a pillow under your knees. °· Only take over-the-counter or prescription medicines as directed by your caregiver. Over-the-counter medicines to reduce pain and inflammation are often the most helpful. Your caregiver may prescribe muscle relaxant drugs. These medicines help dull your pain so you can more quickly return to your normal activities and healthy exercise. °· Put ice on the injured area. °· Put ice in a plastic bag. °· Place a towel between your skin and the bag. °· Leave the ice on for 15-20 minutes, 03-04 times a day for the first 2 to 3 days. After that, ice and heat may be alternated to reduce pain and spasms. °· Ask your caregiver about trying back exercises and gentle massage. This may be of some benefit. °· Avoid feeling anxious or stressed. Stress increases muscle tension and can worsen back pain. It is important to recognize when you are anxious or stressed and learn ways to manage it. Exercise is a great option. °SEEK MEDICAL CARE IF: °· You have pain that is not relieved with rest or medicine. °· You  have pain that does not improve in 1 week. °· You have new symptoms. °· You are generally not feeling well. °SEEK IMMEDIATE MEDICAL CARE IF:  °· You have pain that radiates from your back into your legs. °· You develop new bowel or bladder control problems. °· You have unusual weakness or numbness in your arms or legs. °· You develop nausea or vomiting. °· You develop abdominal pain. °· You feel faint. °Document Released: 09/24/2005 Document Revised: 03/25/2012 Document Reviewed: 01/26/2014 °ExitCare® Patient Information ©2015 [employer]. This information is not intended to replace advice given to you by your health care provider. Make sure you discuss any questions you have with your health care provider. ° °

## 2014-06-13 NOTE — ED Notes (Signed)
Declined W/C at D/C and was escorted to lobby by RN. 

## 2014-06-15 ENCOUNTER — Encounter: Payer: No Typology Code available for payment source | Admitting: Physical Therapy

## 2014-06-15 ENCOUNTER — Telehealth: Payer: Self-pay | Admitting: Internal Medicine

## 2014-06-15 NOTE — Telephone Encounter (Signed)
Pt. Called wanting to speak with her RN in regards to a referral. She is in the process of renewing her orange card. Please follow up with pt.

## 2014-06-15 NOTE — Telephone Encounter (Signed)
Pt calling to speak to nurse, please f/u with pt.  °

## 2014-06-16 ENCOUNTER — Telehealth: Payer: Self-pay | Admitting: Emergency Medicine

## 2014-06-16 NOTE — Telephone Encounter (Signed)
Left message for pt to return message 

## 2014-06-16 NOTE — ED Provider Notes (Signed)
Medical screening examination/treatment/procedure(s) were performed by non-physician practitioner and as supervising physician I was immediately available for consultation/collaboration.   Lyanne Co, MD 06/16/14 [phone]

## 2014-06-17 ENCOUNTER — Encounter: Payer: No Typology Code available for payment source | Admitting: Physical Therapy

## 2014-06-18 ENCOUNTER — Ambulatory Visit: Payer: No Typology Code available for payment source | Attending: Internal Medicine | Admitting: Internal Medicine

## 2014-06-18 ENCOUNTER — Telehealth: Payer: Self-pay | Admitting: Internal Medicine

## 2014-06-18 ENCOUNTER — Encounter: Payer: Self-pay | Admitting: Internal Medicine

## 2014-06-18 VITALS — BP 120/90 | HR 81 | Temp 98.0°F | Resp 16 | Ht 62.0 in | Wt 199.0 lb

## 2014-06-18 DIAGNOSIS — J45909 Unspecified asthma, uncomplicated: Secondary | ICD-10-CM | POA: Insufficient documentation

## 2014-06-18 DIAGNOSIS — Z888 Allergy status to other drugs, medicaments and biological substances status: Secondary | ICD-10-CM | POA: Insufficient documentation

## 2014-06-18 DIAGNOSIS — Z8249 Family history of ischemic heart disease and other diseases of the circulatory system: Secondary | ICD-10-CM | POA: Insufficient documentation

## 2014-06-18 DIAGNOSIS — Z885 Allergy status to narcotic agent status: Secondary | ICD-10-CM | POA: Insufficient documentation

## 2014-06-18 DIAGNOSIS — Z79899 Other long term (current) drug therapy: Secondary | ICD-10-CM | POA: Insufficient documentation

## 2014-06-18 DIAGNOSIS — M545 Low back pain, unspecified: Secondary | ICD-10-CM

## 2014-06-18 DIAGNOSIS — Z882 Allergy status to sulfonamides status: Secondary | ICD-10-CM | POA: Insufficient documentation

## 2014-06-18 DIAGNOSIS — R7309 Other abnormal glucose: Secondary | ICD-10-CM | POA: Insufficient documentation

## 2014-06-18 DIAGNOSIS — G2581 Restless legs syndrome: Secondary | ICD-10-CM | POA: Insufficient documentation

## 2014-06-18 DIAGNOSIS — I10 Essential (primary) hypertension: Secondary | ICD-10-CM

## 2014-06-18 DIAGNOSIS — Z91018 Allergy to other foods: Secondary | ICD-10-CM | POA: Insufficient documentation

## 2014-06-18 DIAGNOSIS — Z833 Family history of diabetes mellitus: Secondary | ICD-10-CM | POA: Insufficient documentation

## 2014-06-18 DIAGNOSIS — I499 Cardiac arrhythmia, unspecified: Secondary | ICD-10-CM | POA: Insufficient documentation

## 2014-06-18 DIAGNOSIS — Z880 Allergy status to penicillin: Secondary | ICD-10-CM | POA: Insufficient documentation

## 2014-06-18 DIAGNOSIS — Z881 Allergy status to other antibiotic agents status: Secondary | ICD-10-CM | POA: Insufficient documentation

## 2014-06-18 DIAGNOSIS — R51 Headache: Secondary | ICD-10-CM | POA: Insufficient documentation

## 2014-06-18 DIAGNOSIS — E039 Hypothyroidism, unspecified: Secondary | ICD-10-CM

## 2014-06-18 MED ORDER — LOSARTAN POTASSIUM 50 MG PO TABS: 50.0000 mg | ORAL_TABLET | Freq: Every day | ORAL | Status: DC

## 2014-06-18 MED ORDER — CLONIDINE HCL 0.1 MG PO TABS
0.1000 mg | ORAL_TABLET | Freq: Once | ORAL | Status: AC
  Administered 2014-06-18: 0.1 mg via ORAL

## 2014-06-18 MED ORDER — DICLOFENAC SODIUM 75 MG PO TBEC: 75.0000 mg | DELAYED_RELEASE_TABLET | Freq: Two times a day (BID) | ORAL | Status: DC

## 2014-06-18 NOTE — Progress Notes (Signed)
Pt is here following up on her HTN and chronic pain in her lower back.

## 2014-06-18 NOTE — Progress Notes (Signed)
Patient ID: Sandra Patrick, female   DOB: [birth year], 58 y.o.   MRN: [id number]  CC:  Lower back pain  HPI:  Patient c/o of lower back pain.  Patient states that she has been in the hospital due to severe lower back pain.  She reports that she was given pain medication and told to follow up with her PCP.  She denies bowel and bladder dysfunction.  She states that she has been seen by physical therapy in the past and was told that the pain is referred from her leg. She reports that she needs her thyroid level checked due to a long history of hypothyroidism.  Allergies  Allergen Reactions  . Chocolate Nausea Only and Other (See Comments)    Face feels puffy  . Ceclor [Cefaclor] Rash  . Codeine Swelling and Rash  . Penicillins Rash  . Sulfa Antibiotics Rash  . Zithromax [Azithromycin] Rash   Past Medical History  Diagnosis Date  . Hypertension   . Irregular heart beat   . Restless leg syndrome   . Asthma   . Thyroid disease   . Headache    Current Outpatient Prescriptions on File Prior to Visit  Medication Sig Dispense Refill  . albuterol (PROVENTIL HFA;VENTOLIN HFA) 108 (90 BASE) MCG/ACT inhaler Inhale 2 puffs into the lungs every 6 (six) hours as needed for wheezing or shortness of breath.  1 Inhaler  5  . amLODipine (NORVASC) 10 MG tablet Take 1 tablet (10 mg total) by mouth daily.  30 tablet  3  . atorvastatin (LIPITOR) 10 MG tablet Take 1 tablet (10 mg total) by mouth daily.  90 tablet  1  . benzocaine-menthol (SORE THROAT) 6-10 MG lozenge Take 1 lozenge by mouth as needed for sore throat.  100 tablet  0  . citalopram (CELEXA) 20 MG tablet Take 1 tablet (20 mg total) by mouth daily.  30 tablet  3  . clonazePAM (KLONOPIN) 1 MG tablet Take 1 tablet (1 mg total) by mouth at bedtime as needed (restless legs).  45 tablet  0  . cyclobenzaprine (FLEXERIL) 10 MG tablet Take 1 tablet (10 mg total) by mouth 3 (three) times daily as needed for muscle spasms.  30 tablet  0  . hydrochlorothiazide  (HYDRODIURIL) 25 MG tablet Take 1 tablet (25 mg total) by mouth daily.  30 tablet  3  . HYDROcodone-acetaminophen (NORCO/VICODIN) 5-325 MG per tablet Take 1-2 tablets by mouth every 4 (four) hours as needed.  12 tablet  0  . levothyroxine (SYNTHROID, LEVOTHROID) 88 MCG tablet Take 1 tablet (88 mcg total) by mouth daily.  90 tablet  3  . meloxicam (MOBIC) 15 MG tablet Take 1 tablet (15 mg total) by mouth daily.  30 tablet  1  . Nystatin Domestic POWD 1 each by Does not apply route 2 (two) times daily.  1 each  1  . predniSONE (DELTASONE) 10 MG tablet Take 5 tablets day 2 (day one was given to you in Emergency dept) Take 4 tablets day 3 Take 3 tablets day 4 Take 2 tablets day 5 Take 1 tablet day 6  15 tablet  0  . traMADol (ULTRAM) 50 MG tablet Take 1 tablet (50 mg total) by mouth every 6 (six) hours as needed.  60 tablet  0  . loratadine (CLARITIN) 10 MG tablet Take 1 tablet (10 mg total) by mouth daily.  30 tablet  5  . NEOMYCIN-POLYMYXIN-HYDROCORTISONE (CORTISPORIN) 1 % SOLN otic solution Place 2 drops into  the right ear 2 (two) times daily.  10 mL  0   No current facility-administered medications on file prior to visit.   Family History  Problem Relation Age of Onset  . Heart disease Mother   . Heart disease Father   . Diabetes Brother   . Arthritis Brother    History   Social History  . Marital Status: Divorced    Spouse Name: N/A    Number of Children: N/A  . Years of Education: N/A   Occupational History  . Not on file.   Social History Main Topics  . Smoking status: Never Smoker   . Smokeless tobacco: Not on file  . Alcohol Use: No  . Drug Use: No  . Sexual Activity: Yes   Other Topics Concern  . Not on file   Social History Narrative  . No narrative on file    Review of Systems  Eyes: Negative.   Respiratory: Negative.   Cardiovascular: Negative.   Genitourinary: Negative.   Musculoskeletal: Positive for back pain. Negative for neck pain.  Neurological:  Negative for headaches.     Objective:   Filed Vitals:   06/18/14 0959  BP: 178/131  Pulse: 81  Temp: 98 F (36.7 C)  Resp: 16    Physical Exam  Cardiovascular: Normal rate, regular rhythm and normal heart sounds.   Pulmonary/Chest: Effort normal and breath sounds normal.  Abdominal: Soft. Bowel sounds are normal.  Musculoskeletal: She exhibits tenderness (lumbar spine).     Lab Results  Component Value Date   WBC 6.2 04/13/2014   HGB 14.4 04/13/2014   HCT 41.7 04/13/2014   MCV 87.8 04/13/2014   PLT 213 04/13/2014   Lab Results  Component Value Date   CREATININE 0.90 12/26/2013   BUN 12 12/26/2013   NA 145 12/26/2013   K 4.0 12/26/2013   CL 108 12/26/2013   CO2 26 12/16/2013    Lab Results  Component Value Date   HGBA1C 6.1* 04/13/2014   Lipid Panel     Component Value Date/Time   CHOL 163 04/13/2014 0955   TRIG 150* 04/13/2014 0955   HDL 54 04/13/2014 0955   CHOLHDL 3.0 04/13/2014 0955   VLDL 30 04/13/2014 0955   LDLCALC 79 04/13/2014 0955       Assessment and plan:   Sandra Patrick was seen today for follow-up.  Diagnoses and associated orders for this visit:  Essential hypertension - cloNIDine (CATAPRES) tablet 0.1 mg; Take 1 tablet (0.1 mg total) by mouth once. - Continue losartan (COZAAR) 50 MG tablet; Take 1 tablet (50 mg total) by mouth daily.  Midline low back pain without sciatica - Ambulatory referral to Sports Medicine - diclofenac (VOLTAREN) 75 MG EC tablet; Take 1 tablet (75 mg total) by mouth 2 (two) times daily.  Unspecified hypothyroidism - TSH--Will call with results and/or changes to medication regimen  Prediabetes  Will recheck a1c in 6 months.   Return in about 2 weeks (around 07/02/2014) for Nurse Visit-BP check, 3 mo hypothyroid/HTN.     Holland Commons, NP-C [facility] [phone] 07/04/2014, 10:31 PM

## 2014-06-18 NOTE — Patient Instructions (Signed)
Back Pain, Adult Low back pain is very common. About 1 in 5 people have back pain.The cause of low back pain is rarely dangerous. The pain often gets better over time.About half of people with a sudden onset of back pain feel better in just 2 weeks. About 8 in 10 people feel better by 6 weeks.  CAUSES Some common causes of back pain include:  Strain of the muscles or ligaments supporting the spine.  Wear and tear (degeneration) of the spinal discs.  Arthritis.  Direct injury to the back. DIAGNOSIS Most of the time, the direct cause of low back pain is not known.However, back pain can be treated effectively even when the exact cause of the pain is unknown.Answering your caregiver's questions about your overall health and symptoms is one of the most accurate ways to make sure the cause of your pain is not dangerous. If your caregiver needs more information, he or she may order lab work or imaging tests (X-rays or MRIs).However, even if imaging tests show changes in your back, this usually does not require surgery. HOME CARE INSTRUCTIONS For many people, back pain returns.Since low back pain is rarely dangerous, it is often a condition that people can learn to manageon their own.   Remain active. It is stressful on the back to sit or stand in one place. Do not sit, drive, or stand in one place for more than 30 minutes at a time. Take short walks on level surfaces as soon as pain allows.Try to increase the length of time you walk each day.  Do not stay in bed.Resting more than 1 or 2 days can delay your recovery.  Do not avoid exercise or work.Your body is made to move.It is not dangerous to be active, even though your back may hurt.Your back will likely heal faster if you return to being active before your pain is gone.  Pay attention to your body when you bend and lift. Many people have less discomfortwhen lifting if they bend their knees, keep the load close to their bodies,and  avoid twisting. Often, the most comfortable positions are those that put less stress on your recovering back.  Find a comfortable position to sleep. Use a firm mattress and lie on your side with your knees slightly bent. If you lie on your back, put a pillow under your knees.  Only take over-the-counter or prescription medicines as directed by your caregiver. Over-the-counter medicines to reduce pain and inflammation are often the most helpful.Your caregiver may prescribe muscle relaxant drugs.These medicines help dull your pain so you can more quickly return to your normal activities and healthy exercise.  Put ice on the injured area.  Put ice in a plastic bag.  Place a towel between your skin and the bag.  Leave the ice on for 15-20 minutes, 03-04 times a day for the first 2 to 3 days. After that, ice and heat may be alternated to reduce pain and spasms.  Ask your caregiver about trying back exercises and gentle massage. This may be of some benefit.  Avoid feeling anxious or stressed.Stress increases muscle tension and can worsen back pain.It is important to recognize when you are anxious or stressed and learn ways to manage it.Exercise is a great option. SEEK MEDICAL CARE IF:  You have pain that is not relieved with rest or medicine.  You have pain that does not improve in 1 week.  You have new symptoms.  You are generally not feeling well. SEEK   IMMEDIATE MEDICAL CARE IF:   You have pain that radiates from your back into your legs.  You develop new bowel or bladder control problems.  You have unusual weakness or numbness in your arms or legs.  You develop nausea or vomiting.  You develop abdominal pain.  You feel faint. Document Released: 09/24/2005 Document Revised: 03/25/2012 Document Reviewed: 01/26/2014 Exit[employer] Patient Information 2015 [employer]. This information is not intended to replace advice given to you by your health care provider. Make sure you  discuss any questions you have with your health care provider.  

## 2014-06-18 NOTE — Telephone Encounter (Signed)
Patient called in to inform PCP that she may be a few minutes late due to the delay in the bus schedule;

## 2014-06-19 LAB — TSH: TSH: 17.348 u[IU]/mL — ABNORMAL HIGH (ref 0.350–4.500)

## 2014-06-28 ENCOUNTER — Telehealth: Payer: Self-pay | Admitting: Internal Medicine

## 2014-06-28 NOTE — Telephone Encounter (Signed)
Patient calling to request results from OV 06/18/14. Patient states she had her thyroid levels checked. Please contact pt.

## 2014-06-29 ENCOUNTER — Telehealth: Payer: Self-pay | Admitting: *Deleted

## 2014-06-29 NOTE — Telephone Encounter (Signed)
Left message on VM to return my call. 

## 2014-06-30 ENCOUNTER — Telehealth: Payer: Self-pay | Admitting: Internal Medicine

## 2014-06-30 ENCOUNTER — Telehealth: Payer: Self-pay | Admitting: Emergency Medicine

## 2014-06-30 NOTE — Telephone Encounter (Signed)
Left message for pt to call when message received 

## 2014-06-30 NOTE — Telephone Encounter (Signed)
Patient needs to know Lab test results done on 9/11. Please f/u with Patient

## 2014-07-07 ENCOUNTER — Telehealth: Payer: Self-pay | Admitting: Emergency Medicine

## 2014-07-07 ENCOUNTER — Telehealth: Payer: Self-pay | Admitting: Internal Medicine

## 2014-07-07 NOTE — Telephone Encounter (Signed)
Pt given lab results with instructions to make sure to take prescribed medication in the morning 1 hour after eating for better absorption. Pt state she takes thyroid medication regularly everyday Scheduled lab appointment given 09/06/2014 @ 930am

## 2014-07-07 NOTE — Telephone Encounter (Signed)
Patient calling to request lab results. Please contact patient.

## 2014-07-20 ENCOUNTER — Ambulatory Visit: Payer: No Typology Code available for payment source

## 2014-07-28 ENCOUNTER — Telehealth: Payer: Self-pay | Admitting: Internal Medicine

## 2014-07-28 ENCOUNTER — Telehealth: Payer: Self-pay | Admitting: *Deleted

## 2014-07-28 NOTE — Telephone Encounter (Signed)
I spoke to the pt and she stated that she had a respiratory infection and could she come in and be seen by her doctor. I instructed her to come in tomorrow as a walk in.

## 2014-07-28 NOTE — Telephone Encounter (Signed)
Patient has a respiratory problem and requested an appointment for today. When I told the Patient that her PCP doesn't have appointments available she requested to be transfer with PCP's Nurse.

## 2014-07-29 ENCOUNTER — Emergency Department (HOSPITAL_COMMUNITY)
Admission: EM | Admit: 2014-07-29 | Discharge: 2014-07-29 | Disposition: A | Discharge status: Home / Self Care | Payer: No Typology Code available for payment source | Attending: Emergency Medicine | Admitting: Emergency Medicine

## 2014-07-29 ENCOUNTER — Encounter (HOSPITAL_COMMUNITY): Payer: Self-pay | Admitting: Emergency Medicine

## 2014-07-29 DIAGNOSIS — R05 Cough: Secondary | ICD-10-CM

## 2014-07-29 DIAGNOSIS — J45901 Unspecified asthma with (acute) exacerbation: Secondary | ICD-10-CM | POA: Insufficient documentation

## 2014-07-29 DIAGNOSIS — I10 Essential (primary) hypertension: Secondary | ICD-10-CM | POA: Insufficient documentation

## 2014-07-29 DIAGNOSIS — Z7952 Long term (current) use of systemic steroids: Secondary | ICD-10-CM | POA: Insufficient documentation

## 2014-07-29 DIAGNOSIS — Z791 Long term (current) use of non-steroidal anti-inflammatories (NSAID): Secondary | ICD-10-CM | POA: Insufficient documentation

## 2014-07-29 DIAGNOSIS — J029 Acute pharyngitis, unspecified: Secondary | ICD-10-CM | POA: Insufficient documentation

## 2014-07-29 DIAGNOSIS — Z880 Allergy status to penicillin: Secondary | ICD-10-CM | POA: Insufficient documentation

## 2014-07-29 DIAGNOSIS — R059 Cough, unspecified: Secondary | ICD-10-CM

## 2014-07-29 DIAGNOSIS — Z79899 Other long term (current) drug therapy: Secondary | ICD-10-CM | POA: Insufficient documentation

## 2014-07-29 DIAGNOSIS — E079 Disorder of thyroid, unspecified: Secondary | ICD-10-CM | POA: Insufficient documentation

## 2014-07-29 DIAGNOSIS — G2581 Restless legs syndrome: Secondary | ICD-10-CM | POA: Insufficient documentation

## 2014-07-29 MED ORDER — ALBUTEROL SULFATE HFA 108 (90 BASE) MCG/ACT IN AERS: 2.0000 | INHALATION_SPRAY | RESPIRATORY_TRACT | Status: DC | PRN

## 2014-07-29 MED ORDER — DEXTROMETHORPHAN POLISTIREX 30 MG/5ML PO LQCR: 30.0000 mg | ORAL | Status: DC | PRN

## 2014-07-29 MED ORDER — DOXYCYCLINE HYCLATE 100 MG PO CAPS: 100.0000 mg | ORAL_CAPSULE | Freq: Two times a day (BID) | ORAL | Status: DC

## 2014-07-29 NOTE — ED Provider Notes (Signed)
CSN: [id number]     Arrival date & time 07/29/14  0931 History  This chart was scribed for non-physician practitioner, Roxy Horsemanobert Artis Buechele, PA-C, working with Mirian MoMatthew Gentry, MD by Charline BillsEssence Howell, ED Scribe. This patient was seen in room TR10C/TR10C and the patient's care was started at 10:21 AM.   Chief Complaint  Patient presents with  . URI  . Sore Throat   The history is provided by the patient. No language interpreter was used.   HPI Comments: Sandra Patrick is a 58 y.o. female, with a h/o asthma, who presents to the Emergency Department with a chief complaint of sore throat onset 3 days ago. She reports associated congestion, productive cough with clear sputum, subjective fever, wheezing onset this morning. Pt has tried her inhaler and Vicks vapor rub with mild relief. Allergies: PCN, Zithromax, Sulfa.   Past Medical History  Diagnosis Date  . Hypertension   . Irregular heart beat   . Restless leg syndrome   . Asthma   . Thyroid disease   . Headache    Past Surgical History  Procedure Laterality Date  . Eye surgery    . Tonsillectomy    . Cesarean section    . Abdominal hysterectomy    . Carpal tunnel release     Family History  Problem Relation Age of Onset  . Heart disease Mother   . Heart disease Father   . Diabetes Brother   . Arthritis Brother    History  Substance Use Topics  . Smoking status: Never Smoker   . Smokeless tobacco: Not on file  . Alcohol Use: No   OB History   Grav Para Term Preterm Abortions TAB SAB Ect Mult Living                 Review of Systems  Constitutional: Positive for fever.  HENT: Positive for congestion and sore throat.   Respiratory: Positive for cough and wheezing.   All other systems reviewed and are negative.  Allergies  Chocolate; Ceclor; Codeine; Penicillins; Sulfa antibiotics; and Zithromax  Home Medications   Prior to Admission medications   Medication Sig Start Date End Date Taking? Authorizing Provider  albuterol  (PROVENTIL HFA;VENTOLIN HFA) 108 (90 BASE) MCG/ACT inhaler Inhale 2 puffs into the lungs every 6 (six) hours as needed for wheezing or shortness of breath. 03/18/14   Ambrose FinlandValerie A Keck, NP  amLODipine (NORVASC) 10 MG tablet Take 1 tablet (10 mg total) by mouth daily. 03/18/14   Ambrose FinlandValerie A Keck, NP  atorvastatin (LIPITOR) 10 MG tablet Take 1 tablet (10 mg total) by mouth daily. 03/18/14   Ambrose FinlandValerie A Keck, NP  benzocaine-menthol (SORE THROAT) 6-10 MG lozenge Take 1 lozenge by mouth as needed for sore throat. 05/27/14   Quentin Angstlugbemiga E Jegede, MD  citalopram (CELEXA) 20 MG tablet Take 1 tablet (20 mg total) by mouth daily. 01/12/14   Richarda OverlieNayana Abrol, MD  clonazePAM (KLONOPIN) 1 MG tablet Take 1 tablet (1 mg total) by mouth at bedtime as needed (restless legs). 03/18/14   Ambrose FinlandValerie A Keck, NP  cyclobenzaprine (FLEXERIL) 10 MG tablet Take 1 tablet (10 mg total) by mouth 3 (three) times daily as needed for muscle spasms. 05/20/14   Quentin Angstlugbemiga E Jegede, MD  diclofenac (VOLTAREN) 75 MG EC tablet Take 1 tablet (75 mg total) by mouth 2 (two) times daily. 06/18/14   Ambrose FinlandValerie A Keck, NP  hydrochlorothiazide (HYDRODIURIL) 25 MG tablet Take 1 tablet (25 mg total) by mouth daily. 03/18/14  Ambrose FinlandValerie A Keck, NP  HYDROcodone-acetaminophen (NORCO/VICODIN) 5-325 MG per tablet Take 1-2 tablets by mouth every 4 (four) hours as needed. 06/13/14   Shari A Upstill, PA-C  levothyroxine (SYNTHROID, LEVOTHROID) 88 MCG tablet Take 1 tablet (88 mcg total) by mouth daily. 03/24/14   Ambrose FinlandValerie A Keck, NP  loratadine (CLARITIN) 10 MG tablet Take 1 tablet (10 mg total) by mouth daily. 03/18/14   Ambrose FinlandValerie A Keck, NP  losartan (COZAAR) 50 MG tablet Take 1 tablet (50 mg total) by mouth daily. 06/18/14   Ambrose FinlandValerie A Keck, NP  meloxicam (MOBIC) 15 MG tablet Take 1 tablet (15 mg total) by mouth daily. 05/20/14   Deanna M Didiano, DO  NEOMYCIN-POLYMYXIN-HYDROCORTISONE (CORTISPORIN) 1 % SOLN otic solution Place 2 drops into the right ear 2 (two) times daily. 05/27/14    Quentin Angstlugbemiga E Jegede, MD  Nystatin Domestic POWD 1 each by Does not apply route 2 (two) times daily. 05/20/14   Quentin Angstlugbemiga E Jegede, MD  predniSONE (DELTASONE) 10 MG tablet Take 5 tablets day 2 (day one was given to you in Emergency dept) Take 4 tablets day 3 Take 3 tablets day 4 Take 2 tablets day 5 Take 1 tablet day 6 06/13/14   Shari A Upstill, PA-C  traMADol (ULTRAM) 50 MG tablet Take 1 tablet (50 mg total) by mouth every 6 (six) hours as needed. 04/13/14   Quentin Angstlugbemiga E Jegede, MD   Triage Vitals: BP 118/69  Pulse 86  Temp(Src) 98.4 F (36.9 C) (Oral)  Resp 18  SpO2 100% Physical Exam  Nursing note and vitals reviewed. Constitutional: She is oriented to person, place, and time. She appears well-developed and well-nourished. No distress.  HENT:  Head: Normocephalic and atraumatic.  Oropharynx is clear No sign of para tonsillar abscess Uvula midline Airway intact No stridor   Eyes: Conjunctivae and EOM are normal.  Neck: Neck supple.  Cardiovascular: Normal rate and regular rhythm.  Exam reveals no gallop and no friction rub.   No murmur heard. Pulmonary/Chest: Effort normal and breath sounds normal. No respiratory distress. She has no wheezes. She has no rales. She exhibits no tenderness.  CTAB  Abdominal: She exhibits no distension.  Musculoskeletal: Normal range of motion.  Neurological: She is alert and oriented to person, place, and time.  Skin: Skin is warm and dry.  Psychiatric: She has a normal mood and affect. Her behavior is normal.   ED Course  Procedures (including critical care time) DIAGNOSTIC STUDIES: Oxygen Saturation is 100% on RA, normal by my interpretation.    COORDINATION OF CARE: 10:25 AM-Discussed treatment plan which includes antibiotics and continue inhaler use with pt at bedside and pt agreed to plan.   Labs Review Labs Reviewed - No data to display  Imaging Review No results found.   EKG Interpretation None      MDM   Final diagnoses:   Sore throat  Cough    Patients symptoms are consistent with URI, likely viral etiology.  Pt will be discharged with symptomatic treatment.  Verbalizes understanding and is agreeable with plan. Pt is hemodynamically stable & in NAD prior to dc.   I personally performed the services described in this documentation, which was scribed in my presence. The recorded information has been reviewed and is accurate.    Roxy Horsemanobert Remmy Crass, PA-C 07/29/14 1546

## 2014-07-29 NOTE — ED Notes (Signed)
Pt requested to have med changed to another for financial reasons, spoke with pa, none on $4 list in comparison to URI coverage and pt allergies. Spoke with pt about the same. Ambulatory with steady gait to d/c desk with RN

## 2014-07-29 NOTE — ED Notes (Signed)
Patient states took some nyquil last night for symptom control, but didn't help.

## 2014-07-29 NOTE — ED Notes (Signed)
Pt c/o URI sx with sore throat x 2 days

## 2014-07-29 NOTE — Discharge Instructions (Signed)
Upper Respiratory Infection, Adult An upper respiratory infection (URI) is also known as the common cold. It is often caused by a type of germ (virus). Colds are easily spread (contagious). You can pass it to others by kissing, coughing, sneezing, or drinking out of the same glass. Usually, you get better in 1 or 2 weeks.  HOME CARE   Only take medicine as told by your doctor.  Use a warm mist humidifier or breathe in steam from a hot shower.  Drink enough water and fluids to keep your pee (urine) clear or pale yellow.  Get plenty of rest.  Return to work when your temperature is back to normal or as told by your doctor. You may use a face mask and wash your hands to stop your cold from spreading. GET HELP RIGHT AWAY IF:   After the first few days, you feel you are getting worse.  You have questions about your medicine.  You have chills, shortness of breath, or brown or red spit (mucus).  You have yellow or brown snot (nasal discharge) or pain in the face, especially when you bend forward.  You have a fever, puffy (swollen) neck, pain when you swallow, or white spots in the back of your throat.  You have a bad headache, ear pain, sinus pain, or chest pain.  You have a high-pitched whistling sound when you breathe in and out (wheezing).  You have a lasting cough or cough up blood.  You have sore muscles or a stiff neck. MAKE SURE YOU:   Understand these instructions.  Will watch your condition.  Will get help right away if you are not doing well or get worse. Document Released: 03/12/2008 Document Revised: 12/17/2011 Document Reviewed: 12/30/2013 Exit[employer] Patient Information 2015 [employer]. This information is not intended to replace advice given to you by your health care provider. Make sure you discuss any questions you have with your health care provider.  

## 2014-07-30 ENCOUNTER — Ambulatory Visit: Payer: No Typology Code available for payment source

## 2014-08-03 ENCOUNTER — Ambulatory Visit: Payer: No Typology Code available for payment source

## 2014-08-03 NOTE — ED Provider Notes (Signed)
Medical screening examination/treatment/procedure(s) were performed by non-physician practitioner and as supervising physician I was immediately available for consultation/collaboration.   EKG Interpretation None        Mirian MoMatthew Gentry, MD 08/03/14 [phone]

## 2014-08-04 ENCOUNTER — Ambulatory Visit: Payer: No Typology Code available for payment source | Admitting: Internal Medicine

## 2014-08-23 ENCOUNTER — Ambulatory Visit: Payer: No Typology Code available for payment source

## 2014-09-02 ENCOUNTER — Encounter (HOSPITAL_COMMUNITY): Payer: Self-pay | Admitting: Emergency Medicine

## 2014-09-02 ENCOUNTER — Emergency Department (HOSPITAL_COMMUNITY)
Admission: EM | Admit: 2014-09-02 | Discharge: 2014-09-02 | Disposition: A | Discharge status: Home / Self Care | Payer: No Typology Code available for payment source | Attending: Emergency Medicine | Admitting: Emergency Medicine

## 2014-09-02 DIAGNOSIS — M545 Low back pain: Secondary | ICD-10-CM | POA: Insufficient documentation

## 2014-09-02 DIAGNOSIS — M5489 Other dorsalgia: Secondary | ICD-10-CM

## 2014-09-02 DIAGNOSIS — Z8669 Personal history of other diseases of the nervous system and sense organs: Secondary | ICD-10-CM | POA: Insufficient documentation

## 2014-09-02 DIAGNOSIS — Z792 Long term (current) use of antibiotics: Secondary | ICD-10-CM | POA: Insufficient documentation

## 2014-09-02 DIAGNOSIS — Z880 Allergy status to penicillin: Secondary | ICD-10-CM | POA: Insufficient documentation

## 2014-09-02 DIAGNOSIS — Z79899 Other long term (current) drug therapy: Secondary | ICD-10-CM | POA: Insufficient documentation

## 2014-09-02 DIAGNOSIS — J45909 Unspecified asthma, uncomplicated: Secondary | ICD-10-CM | POA: Insufficient documentation

## 2014-09-02 DIAGNOSIS — Z791 Long term (current) use of non-steroidal anti-inflammatories (NSAID): Secondary | ICD-10-CM | POA: Insufficient documentation

## 2014-09-02 DIAGNOSIS — I10 Essential (primary) hypertension: Secondary | ICD-10-CM | POA: Insufficient documentation

## 2014-09-02 DIAGNOSIS — E079 Disorder of thyroid, unspecified: Secondary | ICD-10-CM | POA: Insufficient documentation

## 2014-09-02 MED ORDER — HYDROCODONE-ACETAMINOPHEN 5-325 MG PO TABS: 1.0000 | ORAL_TABLET | Freq: Four times a day (QID) | ORAL | Status: DC | PRN

## 2014-09-02 MED ORDER — KETOROLAC TROMETHAMINE 30 MG/ML IJ SOLN
30.0000 mg | Freq: Once | INTRAMUSCULAR | Status: AC
  Administered 2014-09-02: 30 mg via INTRAMUSCULAR
  Filled 2014-09-02: qty 1

## 2014-09-02 MED ORDER — METHOCARBAMOL 500 MG PO TABS: 500.0000 mg | ORAL_TABLET | Freq: Two times a day (BID) | ORAL | Status: DC

## 2014-09-02 MED ORDER — OXYCODONE-ACETAMINOPHEN 5-325 MG PO TABS
1.0000 | ORAL_TABLET | Freq: Once | ORAL | Status: AC
  Administered 2014-09-02: 1 via ORAL
  Filled 2014-09-02: qty 1

## 2014-09-02 MED ORDER — MELOXICAM 7.5 MG PO TABS: 7.5000 mg | ORAL_TABLET | Freq: Every day | ORAL | Status: DC

## 2014-09-02 NOTE — ED Notes (Signed)
Brought in by EMS from home with c/o lower back pain.  Pt reports that she has chronic lower back pain.  Pt got off from work and ever since, pain has gotten "worse".  Pt has not taken any remedy or medication to relieve pain.

## 2014-09-02 NOTE — ED Provider Notes (Signed)
CSN: [id number]     Arrival date & time 09/02/14  2147 History   First MD Initiated Contact with Patient 09/02/14 2148     Chief Complaint  Patient presents with  . Back Pain     (Consider location/radiation/quality/duration/timing/severity/associated sxs/prior Treatment) HPI Sandra Patrick is a 58 y.o. female who presents to ED with complaint of back pain. Pt with hx of chronic back pain. 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 Pt states that she layed in bed for 2 days after that, with no improvement. Today at work it got worse so after she got home she called EMS and was transported here. Pt states her pain is in lower back. Does not radiate. No numbness or weakness in extremities. No loss of bowel or bladder control. Pt states she was told she has arthritis and pinched nerve in her back. Denies fever, chills, urinary symptoms, malaise. No medications taken at home for pain.   Past Medical History  Diagnosis Date  . Hypertension   . Irregular heart beat   . Restless leg syndrome   . Asthma   . Thyroid disease   . Headache    Past Surgical History  Procedure Laterality Date  . Eye surgery    . Tonsillectomy    . Cesarean section    . Abdominal hysterectomy    . Carpal tunnel release     Family History  Problem Relation Age of Onset  . Heart disease Mother   . Heart disease Father   . Diabetes Brother   . Arthritis Brother    History  Substance Use Topics  . Smoking status: Never Smoker   . Smokeless tobacco: Not on file  . Alcohol Use: No   OB History    No data available     Review of Systems  Constitutional: Negative for fever and chills.  Respiratory: Negative for cough, chest tightness and shortness of breath.   Cardiovascular: Negative for chest pain, palpitations and leg swelling.  Gastrointestinal: Negative for nausea, vomiting, abdominal pain and diarrhea.  Genitourinary: Negative for  dysuria, urgency, flank pain and difficulty urinating.  Musculoskeletal: Positive for back pain and arthralgias. Negative for myalgias, neck pain and neck stiffness.  Skin: Negative for rash.  Neurological: Negative for dizziness, weakness, numbness and headaches.  All other systems reviewed and are negative.     Allergies  Chocolate; Ceclor; Codeine; Penicillins; Sulfa antibiotics; and Zithromax  Home Medications   Prior to Admission medications   Medication Sig Start Date End Date Taking? Authorizing Provider  albuterol (PROVENTIL HFA;VENTOLIN HFA) 108 (90 BASE) MCG/ACT inhaler Inhale 2 puffs into the lungs every 6 (six) hours as needed for wheezing or shortness of breath. 03/18/14   Ambrose FinlandValerie A Keck, NP  albuterol (PROVENTIL HFA;VENTOLIN HFA) 108 (90 BASE) MCG/ACT inhaler Inhale 2 puffs into the lungs every 4 (four) hours as needed for wheezing or shortness of breath. 07/29/14   Roxy Horsemanobert Browning, PA-C  amLODipine (NORVASC) 10 MG tablet Take 1 tablet (10 mg total) by mouth daily. 03/18/14   Ambrose FinlandValerie A Keck, NP  atorvastatin (LIPITOR) 10 MG tablet Take 1 tablet (10 mg total) by mouth daily. 03/18/14   Ambrose FinlandValerie A Keck, NP  benzocaine-menthol (SORE THROAT) 6-10 MG lozenge Take 1 lozenge by mouth as needed for sore throat. 05/27/14   Quentin Angstlugbemiga E Jegede, MD  citalopram (CELEXA) 20 MG tablet Take 1 tablet (20 mg total) by  mouth daily. 01/12/14   Richarda OverlieNayana Abrol, MD  clonazePAM (KLONOPIN) 1 MG tablet Take 1 tablet (1 mg total) by mouth at bedtime as needed (restless legs). 03/18/14   Ambrose FinlandValerie A Keck, NP  cyclobenzaprine (FLEXERIL) 10 MG tablet Take 1 tablet (10 mg total) by mouth 3 (three) times daily as needed for muscle spasms. 05/20/14   Quentin Angstlugbemiga E Jegede, MD  dextromethorphan (DELSYM) 30 MG/5ML liquid Take 5 mLs (30 mg total) by mouth as needed for cough. 07/29/14   Roxy Horsemanobert Browning, PA-C  diclofenac (VOLTAREN) 75 MG EC tablet Take 1 tablet (75 mg total) by mouth 2 (two) times daily. 06/18/14   Ambrose FinlandValerie A  Keck, NP  doxycycline (VIBRAMYCIN) 100 MG capsule Take 1 capsule (100 mg total) by mouth 2 (two) times daily. 07/29/14   Roxy Horsemanobert Browning, PA-C  hydrochlorothiazide (HYDRODIURIL) 25 MG tablet Take 1 tablet (25 mg total) by mouth daily. 03/18/14   Ambrose FinlandValerie A Keck, NP  HYDROcodone-acetaminophen (NORCO/VICODIN) 5-325 MG per tablet Take 1-2 tablets by mouth every 4 (four) hours as needed. 06/13/14   Shari A Upstill, PA-C  levothyroxine (SYNTHROID, LEVOTHROID) 88 MCG tablet Take 1 tablet (88 mcg total) by mouth daily. 03/24/14   Ambrose FinlandValerie A Keck, NP  loratadine (CLARITIN) 10 MG tablet Take 1 tablet (10 mg total) by mouth daily. 03/18/14   Ambrose FinlandValerie A Keck, NP  losartan (COZAAR) 50 MG tablet Take 1 tablet (50 mg total) by mouth daily. 06/18/14   Ambrose FinlandValerie A Keck, NP  meloxicam (MOBIC) 15 MG tablet Take 1 tablet (15 mg total) by mouth daily. 05/20/14   Deanna M Didiano, DO  NEOMYCIN-POLYMYXIN-HYDROCORTISONE (CORTISPORIN) 1 % SOLN otic solution Place 2 drops into the right ear 2 (two) times daily. 05/27/14   Quentin Angstlugbemiga E Jegede, MD  Nystatin Domestic POWD 1 each by Does not apply route 2 (two) times daily. 05/20/14   Quentin Angstlugbemiga E Jegede, MD  predniSONE (DELTASONE) 10 MG tablet Take 5 tablets day 2 (day one was given to you in Emergency dept) Take 4 tablets day 3 Take 3 tablets day 4 Take 2 tablets day 5 Take 1 tablet day 6 06/13/14   Shari A Upstill, PA-C  traMADol (ULTRAM) 50 MG tablet Take 1 tablet (50 mg total) by mouth every 6 (six) hours as needed. 04/13/14   Quentin Angstlugbemiga E Jegede, MD   BP 152/99 mmHg  Pulse 88  Temp(Src) 98.9 F (37.2 C) (Oral)  Resp 18  SpO2 95% Physical Exam  Constitutional: She is oriented to person, place, and time. She appears well-developed and well-nourished.  Tearful  HENT:  Head: Normocephalic.  Eyes: Conjunctivae are normal.  Neck: Neck supple.  Cardiovascular: Normal rate, regular rhythm and normal heart sounds.   Pulmonary/Chest: Effort normal and breath sounds normal. No  respiratory distress. She has no wheezes. She has no rales.  Musculoskeletal: She exhibits no edema.  Midline lumbar spine tenderness. No pain with bilateral straight leg raise  Neurological: She is alert and oriented to person, place, and time.  5/5 and equal lower extremity strength. 2+ and equal patellar reflexes bilaterally. Pt able to dorsiflex bilateral toes and feet with good strength against resistance. Equal sensation bilaterally over thighs and lower legs.   Skin: Skin is warm and dry.  Psychiatric: She has a normal mood and affect. Her behavior is normal.  Nursing note and vitals reviewed.   ED Course  Procedures (including critical care time) Labs Review Labs Reviewed - No data to display  Imaging Review No results found.  EKG Interpretation None      MDM   Final diagnoses:  Midline back pain, unspecified location    Patient with chronic lower back pain. Worsening pain over last week after lifting some boxes at work. She is neurovascularly intact. Afebrile. Evidence of cauda equina. Tearful during examination. She did not take anything prior to coming in. Discussed strengthening exercises, diet, core exercises. Pt has seen sports medicine for this pain in the past, instructed to call for a follow up. Norco and robaxin at home. Continue mobic. Return precautions discussed.   Filed Vitals:   09/02/14 2152  BP: 152/99  Pulse: 88  Temp: 98.9 F (37.2 C)  TempSrc: Oral  Resp: 18  SpO2: 95%     Lottie Mussel, PA-C 09/03/14 0445  Audree Camel, MD 09/04/14 [phone]

## 2014-09-02 NOTE — Discharge Instructions (Signed)
Continue to take mobic daily. Norco for severe pain only. Robaxin for muscle spasms. Follow up with your primary care doctor and with sports medicine. Start exercises as described below.    Back Exercises Back exercises help treat and prevent back injuries. The goal is to increase your strength in your belly (abdominal) and back muscles. These exercises can also help with flexibility. Start these exercises when told by your doctor. HOME CARE Back exercises include: Pelvic Tilt.  Lie on your back with your knees bent. Tilt your pelvis until the lower part of your back is against the floor. Hold this position 5 to 10 sec. Repeat this exercise 5 to 10 times. Knee to Chest.  Pull 1 knee up against your chest and hold for 20 to 30 seconds. Repeat this with the other knee. This may be done with the other leg straight or bent, whichever feels better. Then, pull both knees up against your chest. Sit-Ups or Curl-Ups.  Bend your knees 90 degrees. Start with tilting your pelvis, and do a partial, slow sit-up. Only lift your upper half 30 to 45 degrees off the floor. Take at least 2 to 3 seonds for each sit-up. Do not do sit-ups with your knees out straight. If partial sit-ups are difficult, simply do the above but with only tightening your belly (abdominal) muscles and holding it as told. Hip-Lift.  Lie on your back with your knees flexed 90 degrees. Push down with your feet and shoulders as you raise your hips 2 inches off the floor. Hold for 10 seconds, repeat 5 to 10 times. Back Arches.  Lie on your stomach. Prop yourself up on bent elbows. Slowly press on your hands, causing an arch in your low back. Repeat 3 to 5 times. Shoulder-Lifts.  Lie face down with arms beside your body. Keep hips and belly pressed to floor as you slowly lift your head and shoulders off the floor. Do not overdo your exercises. Be careful in the beginning. Exercises may cause you some mild back discomfort. If the pain lasts  for more than 15 minutes, stop the exercises until you see your doctor. Improvement with exercise for back problems is slow.  Document Released: 10/27/2010 Document Revised: 12/17/2011 Document Reviewed: 07/26/2011 [facility] Patient Information 2015 [employer], [place]. This information is not intended to replace advice given to you by your health care provider. Make sure you discuss any questions you have with your health care provider.

## 2014-09-02 NOTE — ED Notes (Signed)
Bed: WTR5 Expected date:  Expected time:  Means of arrival:  Comments: EMS- back pain 

## 2014-09-06 ENCOUNTER — Other Ambulatory Visit: Payer: No Typology Code available for payment source

## 2014-10-06 ENCOUNTER — Ambulatory Visit: Payer: No Typology Code available for payment source | Admitting: Internal Medicine

## 2014-10-25 ENCOUNTER — Emergency Department (HOSPITAL_COMMUNITY): Payer: 59

## 2014-10-25 ENCOUNTER — Emergency Department (HOSPITAL_COMMUNITY)
Admission: EM | Admit: 2014-10-25 | Discharge: 2014-10-25 | Disposition: A | Discharge status: Home / Self Care | Payer: 59 | Attending: Emergency Medicine | Admitting: Emergency Medicine

## 2014-10-25 DIAGNOSIS — Z7952 Long term (current) use of systemic steroids: Secondary | ICD-10-CM | POA: Diagnosis not present

## 2014-10-25 DIAGNOSIS — Z8669 Personal history of other diseases of the nervous system and sense organs: Secondary | ICD-10-CM | POA: Insufficient documentation

## 2014-10-25 DIAGNOSIS — Z792 Long term (current) use of antibiotics: Secondary | ICD-10-CM | POA: Diagnosis not present

## 2014-10-25 DIAGNOSIS — Z791 Long term (current) use of non-steroidal anti-inflammatories (NSAID): Secondary | ICD-10-CM | POA: Insufficient documentation

## 2014-10-25 DIAGNOSIS — Z79899 Other long term (current) drug therapy: Secondary | ICD-10-CM | POA: Insufficient documentation

## 2014-10-25 DIAGNOSIS — I10 Essential (primary) hypertension: Secondary | ICD-10-CM | POA: Insufficient documentation

## 2014-10-25 DIAGNOSIS — J45909 Unspecified asthma, uncomplicated: Secondary | ICD-10-CM | POA: Diagnosis not present

## 2014-10-25 DIAGNOSIS — E079 Disorder of thyroid, unspecified: Secondary | ICD-10-CM | POA: Insufficient documentation

## 2014-10-25 DIAGNOSIS — R0789 Other chest pain: Secondary | ICD-10-CM | POA: Insufficient documentation

## 2014-10-25 DIAGNOSIS — R1013 Epigastric pain: Secondary | ICD-10-CM | POA: Diagnosis present

## 2014-10-25 DIAGNOSIS — Z880 Allergy status to penicillin: Secondary | ICD-10-CM | POA: Insufficient documentation

## 2014-10-25 LAB — CBC WITH DIFFERENTIAL/PLATELET
Basophils Absolute: 0.1 10*3/uL (ref 0.0–0.1)
Basophils Relative: 1 % (ref 0–1)
EOS PCT: 2 % (ref 0–5)
Eosinophils Absolute: 0.1 10*3/uL (ref 0.0–0.7)
HCT: 45.4 % (ref 36.0–46.0)
HEMOGLOBIN: 14.7 g/dL (ref 12.0–15.0)
Lymphocytes Relative: 32 % (ref 12–46)
Lymphs Abs: 2.2 10*3/uL (ref 0.7–4.0)
MCH: 30.2 pg (ref 26.0–34.0)
MCHC: 32.4 g/dL (ref 30.0–36.0)
MCV: 93.4 fL (ref 78.0–100.0)
MONO ABS: 0.5 10*3/uL (ref 0.1–1.0)
Monocytes Relative: 7 % (ref 3–12)
Neutro Abs: 4.1 10*3/uL (ref 1.7–7.7)
Neutrophils Relative %: 58 % (ref 43–77)
PLATELETS: 196 10*3/uL (ref 150–400)
RBC: 4.86 MIL/uL (ref 3.87–5.11)
RDW: 13.8 % (ref 11.5–15.5)
WBC: 6.9 10*3/uL (ref 4.0–10.5)

## 2014-10-25 LAB — COMPREHENSIVE METABOLIC PANEL
ALBUMIN: 3.9 g/dL (ref 3.5–5.2)
ALK PHOS: 75 U/L (ref 39–117)
ALT: 55 U/L — ABNORMAL HIGH (ref 0–35)
AST: 36 U/L (ref 0–37)
Anion gap: 7 (ref 5–15)
BILIRUBIN TOTAL: 0.4 mg/dL (ref 0.3–1.2)
BUN: 17 mg/dL (ref 6–23)
CALCIUM: 9.3 mg/dL (ref 8.4–10.5)
CHLORIDE: 107 meq/L (ref 96–112)
CO2: 26 mmol/L (ref 19–32)
Creatinine, Ser: 0.99 mg/dL (ref 0.50–1.10)
GFR calc Af Amer: 72 mL/min — ABNORMAL LOW (ref >90)
GFR calc non Af Amer: 63 mL/min — ABNORMAL LOW (ref >90)
Glucose, Bld: 119 mg/dL — ABNORMAL HIGH (ref 70–99)
Potassium: 4.2 mmol/L (ref 3.5–5.1)
Sodium: 140 mmol/L (ref 135–145)
TOTAL PROTEIN: 6.7 g/dL (ref 6.0–8.3)

## 2014-10-25 LAB — ETHANOL: Alcohol, Ethyl (B): <5 mg/dL (ref 0–9)

## 2014-10-25 LAB — LIPASE, BLOOD: LIPASE: 38 U/L (ref 11–59)

## 2014-10-25 LAB — PROTIME-INR
INR: 0.96 (ref 0.00–1.49)
Prothrombin Time: 12.9 seconds (ref 11.6–15.2)

## 2014-10-25 LAB — TROPONIN I: Troponin I: <0.03 ng/mL (ref <0.031)

## 2014-10-25 LAB — I-STAT CG4 LACTIC ACID, ED: Lactic Acid, Venous: 1.45 mmol/L (ref 0.5–2.2)

## 2014-10-25 LAB — BRAIN NATRIURETIC PEPTIDE: B Natriuretic Peptide: 21 pg/mL (ref 0.0–100.0)

## 2014-10-25 MED ORDER — FENTANYL CITRATE 0.05 MG/ML IJ SOLN
25.0000 ug | Freq: Once | INTRAMUSCULAR | Status: AC
  Administered 2014-10-25: 25 ug via INTRAVENOUS
  Filled 2014-10-25: qty 2

## 2014-10-25 MED ORDER — OMEPRAZOLE 20 MG PO CPDR: 20.0000 mg | DELAYED_RELEASE_CAPSULE | Freq: Every day | ORAL | Status: DC

## 2014-10-25 MED ORDER — SODIUM CHLORIDE 0.9 % IV BOLUS (SEPSIS)
1000.0000 mL | Freq: Once | INTRAVENOUS | Status: AC
  Administered 2014-10-25: 1000 mL via INTRAVENOUS

## 2014-10-25 MED ORDER — SUCRALFATE 1 GM/10ML PO SUSP: 1.0000 g | Freq: Three times a day (TID) | ORAL | Status: DC

## 2014-10-25 MED ORDER — GI COCKTAIL ~~LOC~~
30.0000 mL | Freq: Once | ORAL | Status: AC
  Administered 2014-10-25: 30 mL via ORAL
  Filled 2014-10-25: qty 30

## 2014-10-25 NOTE — ED Provider Notes (Signed)
CSN: [id number]     Arrival date & time 10/25/14  1602 History   First MD Initiated Contact with Patient 10/25/14 1603     Chief Complaint  Patient presents with  . Gastrophageal Reflux      HPI  Patient presents with concern of epigastric, sternal pain. She states that over the past month she has had similar episodes, typically occurring without precipitant, but with severe sharp burning pain in the sternum that radiates to the left upper chest. There is no substantial concurrent nausea, dyspnea, lightheadedness. She also denies syncope, vomiting or diarrhea. She has not seen her physician during this month, nor has she had a prior gastrin neurology workup. She has seen a cardiologist in the very distant past. She states that she is generally well, with no recent medical issues, diet issues, health issues.   Past Medical History  Diagnosis Date  . Hypertension   . Irregular heart beat   . Restless leg syndrome   . Asthma   . Thyroid disease   . Headache    Past Surgical History  Procedure Laterality Date  . Eye surgery    . Tonsillectomy    . Cesarean section    . Abdominal hysterectomy    . Carpal tunnel release     Family History  Problem Relation Age of Onset  . Heart disease Mother   . Heart disease Father   . Diabetes Brother   . Arthritis Brother    History  Substance Use Topics  . Smoking status: Never Smoker   . Smokeless tobacco: Not on file  . Alcohol Use: No   OB History    No data available     Review of Systems  Constitutional:       Per HPI, otherwise negative  HENT:       Per HPI, otherwise negative  Respiratory:       Per HPI, otherwise negative  Cardiovascular:       Per HPI, otherwise negative  Gastrointestinal: Negative for vomiting.  Endocrine:       Negative aside from HPI  Genitourinary:       Neg aside from HPI   Musculoskeletal:       Per HPI, otherwise negative  Skin: Negative.   Neurological: Negative for syncope.       Allergies  Chocolate; Ceclor; Codeine; Penicillins; Sulfa antibiotics; and Zithromax  Home Medications   Prior to Admission medications   Medication Sig Start Date End Date Taking? Authorizing Provider  albuterol (PROVENTIL HFA;VENTOLIN HFA) 108 (90 BASE) MCG/ACT inhaler Inhale 2 puffs into the lungs every 6 (six) hours as needed for wheezing or shortness of breath. 03/18/14   Ambrose Finland, NP  albuterol (PROVENTIL HFA;VENTOLIN HFA) 108 (90 BASE) MCG/ACT inhaler Inhale 2 puffs into the lungs every 4 (four) hours as needed for wheezing or shortness of breath. 07/29/14   Roxy Horseman, PA-C  amLODipine (NORVASC) 10 MG tablet Take 1 tablet (10 mg total) by mouth daily. 03/18/14   Ambrose Finland, NP  atorvastatin (LIPITOR) 10 MG tablet Take 1 tablet (10 mg total) by mouth daily. 03/18/14   Ambrose Finland, NP  benzocaine-menthol (SORE THROAT) 6-10 MG lozenge Take 1 lozenge by mouth as needed for sore throat. 05/27/14   Quentin Angst, MD  citalopram (CELEXA) 20 MG tablet Take 1 tablet (20 mg total) by mouth daily. 01/12/14   Richarda Overlie, MD  clonazePAM (KLONOPIN) 1 MG tablet Take 1 tablet (1 mg total)  by mouth at bedtime as needed (restless legs). 03/18/14   Ambrose Finland, NP  cyclobenzaprine (FLEXERIL) 10 MG tablet Take 1 tablet (10 mg total) by mouth 3 (three) times daily as needed for muscle spasms. 05/20/14   Quentin Angst, MD  dextromethorphan (DELSYM) 30 MG/5ML liquid Take 5 mLs (30 mg total) by mouth as needed for cough. 07/29/14   Roxy Horseman, PA-C  diclofenac (VOLTAREN) 75 MG EC tablet Take 1 tablet (75 mg total) by mouth 2 (two) times daily. 06/18/14   Ambrose Finland, NP  doxycycline (VIBRAMYCIN) 100 MG capsule Take 1 capsule (100 mg total) by mouth 2 (two) times daily. 07/29/14   Roxy Horseman, PA-C  hydrochlorothiazide (HYDRODIURIL) 25 MG tablet Take 1 tablet (25 mg total) by mouth daily. 03/18/14   Ambrose Finland, NP  HYDROcodone-acetaminophen (NORCO) 5-325 MG per  tablet Take 1 tablet by mouth every 6 (six) hours as needed. 09/02/14   Tatyana A Kirichenko, PA-C  levothyroxine (SYNTHROID, LEVOTHROID) 88 MCG tablet Take 1 tablet (88 mcg total) by mouth daily. 03/24/14   Ambrose Finland, NP  loratadine (CLARITIN) 10 MG tablet Take 1 tablet (10 mg total) by mouth daily. 03/18/14   Ambrose Finland, NP  losartan (COZAAR) 50 MG tablet Take 1 tablet (50 mg total) by mouth daily. 06/18/14   Ambrose Finland, NP  meloxicam (MOBIC) 7.5 MG tablet Take 1 tablet (7.5 mg total) by mouth daily. 09/02/14   Tatyana A Kirichenko, PA-C  methocarbamol (ROBAXIN) 500 MG tablet Take 1 tablet (500 mg total) by mouth 2 (two) times daily. 09/02/14   Tatyana A Kirichenko, PA-C  NEOMYCIN-POLYMYXIN-HYDROCORTISONE (CORTISPORIN) 1 % SOLN otic solution Place 2 drops into the right ear 2 (two) times daily. 05/27/14   Quentin Angst, MD  Nystatin Domestic POWD 1 each by Does not apply route 2 (two) times daily. 05/20/14   Quentin Angst, MD  predniSONE (DELTASONE) 10 MG tablet Take 5 tablets day 2 (day one was given to you in Emergency dept) Take 4 tablets day 3 Take 3 tablets day 4 Take 2 tablets day 5 Take 1 tablet day 6 06/13/14   Shari A Upstill, PA-C  traMADol (ULTRAM) 50 MG tablet Take 1 tablet (50 mg total) by mouth every 6 (six) hours as needed. 04/13/14   Quentin Angst, MD   BP 156/104 mmHg  Pulse 85  Temp(Src) 99.1 F (37.3 C) (Oral)  Resp 16  Ht  (1.575 m)  Wt 180 lb (81.647 kg)  BMI 32.91 kg/m2  SpO2 97% Physical Exam  Constitutional: She is oriented to person, place, and time. She appears well-developed and well-nourished. No distress.  HENT:  Head: Normocephalic and atraumatic.  Eyes: Conjunctivae and EOM are normal.  Cardiovascular: Normal rate and regular rhythm.   Pulmonary/Chest: Effort normal and breath sounds normal. No stridor. No respiratory distress.    Abdominal: She exhibits no distension.  Musculoskeletal: She exhibits no edema.   Neurological: She is alert and oriented to person, place, and time. No cranial nerve deficit.  Skin: Skin is warm and dry.  Psychiatric: She has a normal mood and affect.  Nursing note and vitals reviewed.   ED Course  Procedures (including critical care time) Labs Review Labs Reviewed  COMPREHENSIVE METABOLIC PANEL - Abnormal; Notable for the following:    Glucose, Bld 119 (*)    ALT 55 (*)    GFR calc non Af Amer 63 (*)    GFR calc Af  Amer 72 (*)    All other components within normal limits  ETHANOL  LIPASE, BLOOD  BRAIN NATRIURETIC PEPTIDE  TROPONIN I  CBC WITH DIFFERENTIAL  PROTIME-INR  I-STAT CG4 LACTIC ACID, ED    Imaging Review Dg Chest 2 View  10/25/2014   CLINICAL DATA:  Gastroesophageal reflux. Heartburn with chest pressure 2-3 weeks without relief.  EXAM: CHEST  2 VIEW  COMPARISON:  01/02/2007  FINDINGS: Lungs are hypoinflated without consolidation or effusion. Cardiomediastinal silhouette is within normal. There is mild degenerative change of the spine.  IMPRESSION: Hypoinflation without acute cardiopulmonary disease.   Electronically Signed   By: Elberta Fortisaniel  Boyle M.D.   On: 10/25/2014 17:08     EKG Interpretation   Date/Time:  Monday October 25 2014 16:17:18 EST Ventricular Rate:  84 PR Interval:  160 QRS Duration: 83 QT Interval:  358 QTC Calculation: 423 R Axis:   88 Text Interpretation:  Sinus rhythm Probable left atrial enlargement Low  voltage, precordial leads Borderline T abnormalities, diffuse leads Sinus  rhythm Low voltage QRS T wave abnormality Abnormal ekg Confirmed by  Gerhard MunchLOCKWOOD, Temisha Murley  MD [phone] on 10/25/2014 4:21:43 PM     I reviewed the EMS rhythm strip. T wave flattening laterally.  7:01 PM Patient in no distress.  We discussed all findings moderately developed with gastroenterology, primary care.    MDM  Patient presents with ongoing episodic sternal and epigastric discomfort. Patient has taken any interventions for her pain over  the course of the month as she's had multiple similar episodes. Evaluation is reassuring, with no evidence for ongoing coronary ischemia, nor infection or other acute pathology. Patient improved here, was awake, alert, hemodynamically stable throughout her emergency department course.  Patient was discharged in stable condition to follow-up with primary care and gastroenterology.   Gerhard Munchobert Janiqua Friscia, MD 10/25/14 [phone]

## 2014-10-25 NOTE — Discharge Instructions (Signed)
As discussed, your evaluation today has been largely reassuring.  But, it is important that you monitor your condition carefully, and do not hesitate to return to the ED if you develop new, or concerning changes in your condition.  Your pain is likely due to irritation of the stomach and esophagus.  Please follow-up with your physicians for appropriate ongoing care.

## 2014-10-25 NOTE — ED Notes (Signed)
Pt taxi home. Pt alertx4 respirations easy non labored

## 2014-10-25 NOTE — ED Notes (Signed)
Per EMS: pt coming from home with c/o acid reflux since this morning. Pt reports feelings of burning and pressure. Denies shortness of breath, n/ v. Pt was given 324 asa and nitro x 1 without relief. Pt A&Ox4, respirations equal and unlabored, skin warm and dry

## 2014-10-26 ENCOUNTER — Encounter: Payer: Self-pay | Admitting: Internal Medicine

## 2014-10-26 ENCOUNTER — Telehealth: Payer: Self-pay | Admitting: Internal Medicine

## 2014-10-26 NOTE — Telephone Encounter (Signed)
Patient has called in today to get clarification on a script she received for a breathing machine; patient is unsure where to get the machine from and would like a call back to know where to pick the item up at;

## 2014-11-01 ENCOUNTER — Ambulatory Visit: Payer: No Typology Code available for payment source | Admitting: Internal Medicine

## 2014-12-06 ENCOUNTER — Ambulatory Visit: Payer: 59 | Attending: Internal Medicine | Admitting: Family Medicine

## 2014-12-06 ENCOUNTER — Encounter: Payer: Self-pay | Admitting: Internal Medicine

## 2014-12-06 VITALS — BP 165/93 | HR 66 | Temp 98.5°F | Resp 16 | Ht 62.0 in | Wt 199.0 lb

## 2014-12-06 DIAGNOSIS — E039 Hypothyroidism, unspecified: Secondary | ICD-10-CM

## 2014-12-06 DIAGNOSIS — F419 Anxiety disorder, unspecified: Secondary | ICD-10-CM | POA: Insufficient documentation

## 2014-12-06 DIAGNOSIS — H499 Unspecified paralytic strabismus: Secondary | ICD-10-CM | POA: Insufficient documentation

## 2014-12-06 DIAGNOSIS — R51 Headache: Secondary | ICD-10-CM | POA: Diagnosis not present

## 2014-12-06 DIAGNOSIS — G8929 Other chronic pain: Secondary | ICD-10-CM

## 2014-12-06 DIAGNOSIS — R519 Headache, unspecified: Secondary | ICD-10-CM

## 2014-12-06 DIAGNOSIS — H4902 Third [oculomotor] nerve palsy, left eye: Secondary | ICD-10-CM | POA: Insufficient documentation

## 2014-12-06 DIAGNOSIS — M545 Low back pain, unspecified: Secondary | ICD-10-CM

## 2014-12-06 DIAGNOSIS — I10 Essential (primary) hypertension: Secondary | ICD-10-CM

## 2014-12-06 DIAGNOSIS — E559 Vitamin D deficiency, unspecified: Secondary | ICD-10-CM | POA: Diagnosis not present

## 2014-12-06 LAB — TSH: TSH: 13.095 u[IU]/mL — AB (ref 0.350–4.500)

## 2014-12-06 MED ORDER — AMLODIPINE BESYLATE 10 MG PO TABS: 10.0000 mg | ORAL_TABLET | Freq: Every day | ORAL | Status: DC

## 2014-12-06 MED ORDER — LOSARTAN POTASSIUM-HCTZ 50-12.5 MG PO TABS: 1.0000 | ORAL_TABLET | Freq: Every day | ORAL | Status: DC

## 2014-12-06 MED ORDER — NAPROXEN 500 MG PO TABS: 500.0000 mg | ORAL_TABLET | Freq: Two times a day (BID) | ORAL | Status: DC | PRN

## 2014-12-06 MED ORDER — TRAMADOL HCL 50 MG PO TABS: 50.0000 mg | ORAL_TABLET | Freq: Two times a day (BID) | ORAL | Status: DC | PRN

## 2014-12-06 NOTE — Patient Instructions (Signed)
[name],  Thank you for coming today.  I see no evidence of brain mass on today's exam.  1. High blood pressure: Goal < 140/90 Continue norvasc 10 mg daily Start hyzaar combo of losartan and HCTZ once daily  2. Hypothyroidism: Continue synthroid Rechecking TSH, may need a dose increase from 88 mcg of synthroid if so you will be called.  3. Headache, sounds like migraine: Naproxen 500 mg twice daily for next 2 days Tramadol as needed for severe pain Prevention: BP control, see above  Referral to eye doctor  F/u with nurse in 3 weeks for BP check  F/u with Sandra Patrick in 6 weeks for headaches and HTN  Dr. Armen PickupFunches

## 2014-12-06 NOTE — Progress Notes (Signed)
   Subjective:    Patient ID: Sandra Patrick, female    DOB: September 17, 1956, 59 y.o.   MRN: [id number] CC: R sided headaches, HTN, med refills  HPI 59 yo F with HTN, hypothyroidism, chronic pain in neck and back:  1. R sided headaches: x 3 months. Worsened over the last weekend. Had similar symptoms in 80s. Has nephew with migraines. Pain with throbbing and pressure. R  Temple and R ear. No fever, trauma, neck stiffness, vision loss, nausea, emesis, weakness, numbness. Pain was 10/10. Today pain is better. Taking BC powders for pain.  2. HTN: taking norvasc 10 mg daily only. Out of losartan and HCTZ. Has HA. No change in vision, CP, SOB, swelling in legs.   3. Hypothyroidism: reports compliance with synthroid. ROS as per above. Also admits to anxiety.  Soc Hx: non smoker  Review of Systems As per HPI     Objective:   Physical Exam BP 165/93 mmHg  Pulse 66  Temp(Src) 98.5 F (36.9 C) (Oral)  Resp 16  Ht 5\' 2"  (1.575 m)  Wt 199 lb (90.266 kg)  BMI 36.39 kg/m2  SpO2 95% General appearance: alert, cooperative and no distress Head: Normocephalic, without obvious abnormality, atraumatic Eyes: negative findings: lids and lashes normal, conjunctivae and sclerae normal, corneas clear, pupils equal, round, reactive to light and accomodation and optic nerve appearance unremarkable, positive findings: L eye w medial rectus palsy.  Ears: normal TM's and external ear canals both ears, mild amount of non impacting wax in both ears  Nose: Nares normal. Septum midline. Mucosa normal. No drainage or sinus tenderness. Throat: lips, mucosa, and tongue normal; teeth and gums normal Neck: no adenopathy, supple, symmetrical, trachea midline and thyroid not enlarged, symmetric, no tenderness/mass/nodules Lungs: clear to auscultation bilaterally Heart: regular rate and rhythm, S1, S2 normal, no murmur, click, rub or gallop  Neurologic: Alert and oriented X 3, normal strength and tone. Normal symmetric  reflexes. Normal coordination and gait  Lab Results  Component Value Date   TSH 17.348* 06/18/2014        Assessment & Plan:

## 2014-12-06 NOTE — Assessment & Plan Note (Addendum)
A:  Lab Results  Component Value Date   TSH 17.348* 06/18/2014   Patient reports compliant with synthroid P:  TSH today Refill/redose synthroid based on today's TSH  Lab Results  Component Value Date   TSH 13.095* 12/06/2014   Refilled synthroid at increased dose of 100 mcg daily  Repeat TSH in 12 weeks

## 2014-12-06 NOTE — Assessment & Plan Note (Signed)
A: elevated above goal with headache. Goal < 140/90  Med: norvasc 10 mg only, patient not compliant with other medications P:  Refilled norvasc 10 Refilled losartan 50 with HCZT 12.5 in combo  F/u BP check with RN in 3 weeks

## 2014-12-06 NOTE — Progress Notes (Signed)
Pt is here following up on her HTN and her asthma. Pt has been having severe headaches and she is having them more frequently.

## 2014-12-06 NOTE — Assessment & Plan Note (Signed)
A: suspect migraine with moderate attack.  Symptoms or not consistent with trigeminal neuralgia. No evidence of acute intracranial process or mass on today's exam P:  Naproxen 500 mg BID for next 2 days  Tramadol for severe pain  For prevention restarting ACEi will help prevent HAs  Also getting BP to goal   F/u PCP in 6 weeks

## 2014-12-07 DIAGNOSIS — E559 Vitamin D deficiency, unspecified: Secondary | ICD-10-CM | POA: Insufficient documentation

## 2014-12-07 LAB — VITAMIN D 25 HYDROXY (VIT D DEFICIENCY, FRACTURES): Vit D, 25-Hydroxy: 10 ng/mL — ABNORMAL LOW (ref 30–100)

## 2014-12-07 IMAGING — CT CT HEAD W/O CM
1 series · 16 of 29 positions shown, 20 images · non-contrast
Comparison: 1116

CLINICAL DATA: Syncope 5 days ago.

CT HEAD WITHOUT CONTRAST
TECHNIQUE: Contiguous axial images were obtained from the base of
the skull through the vertex without contrast.

[Series 2: head 5.0 h30s · axial · 0.43mm/px · z∈[-76,+54]mm · 16 of 29 slices shown, 20 images]
[im 2/29  brain]
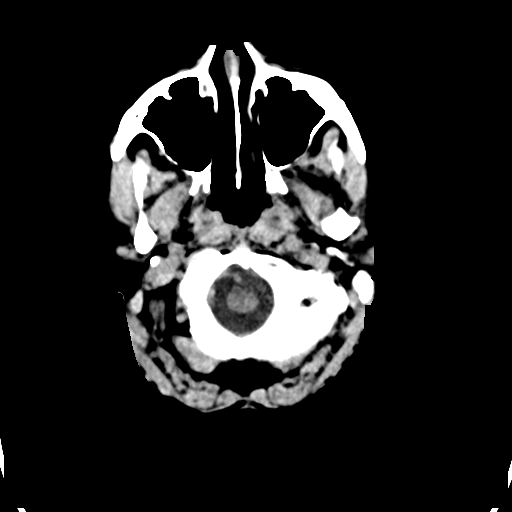
[im 2/29  bone]
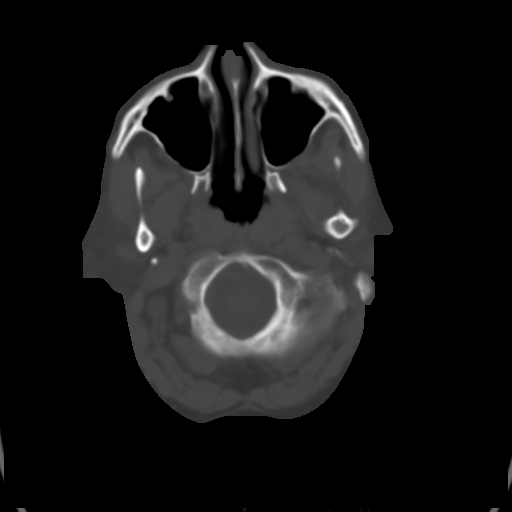
[im 4/29  brain]
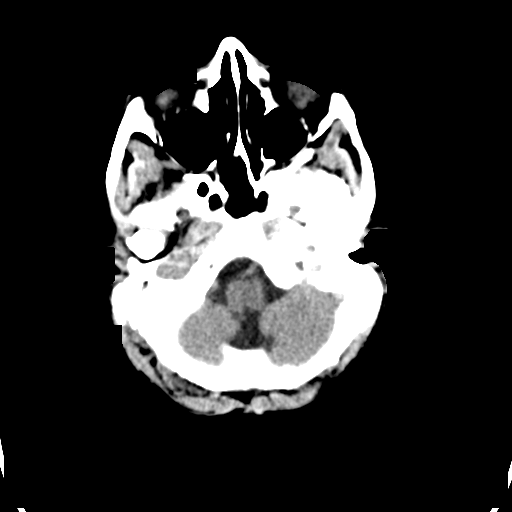
[im 6/29  brain]
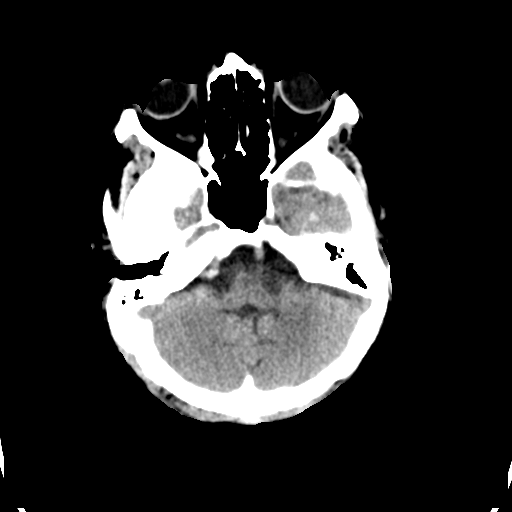
[im 7/29  brain]
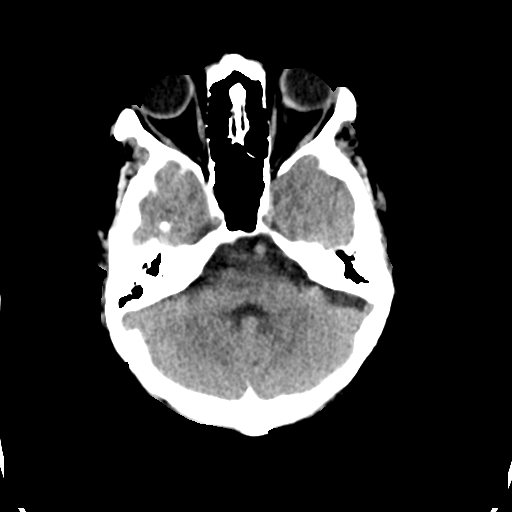
[im 9/29  brain]
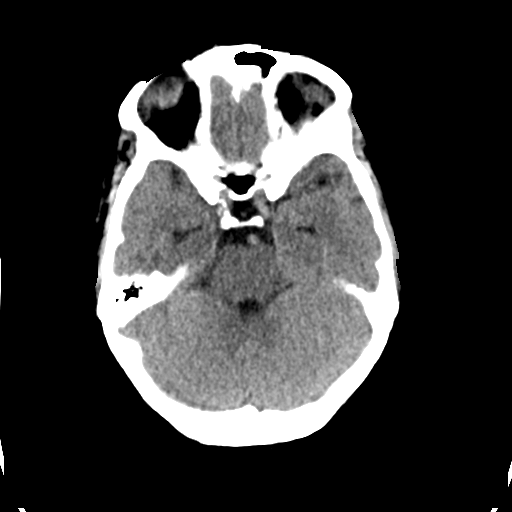
[im 9/29  bone]
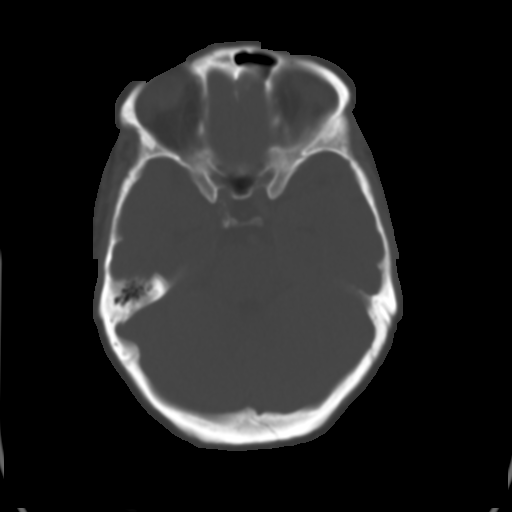
[im 11/29  brain]
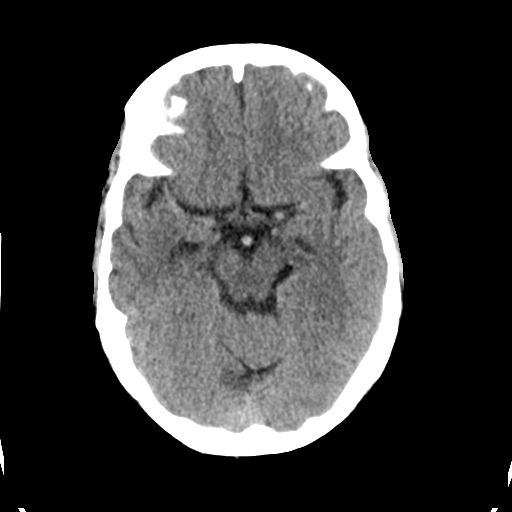
[im 12/29  brain]
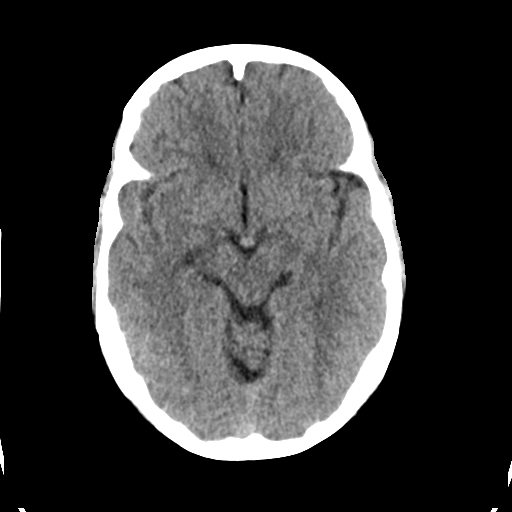
[im 14/29  brain]
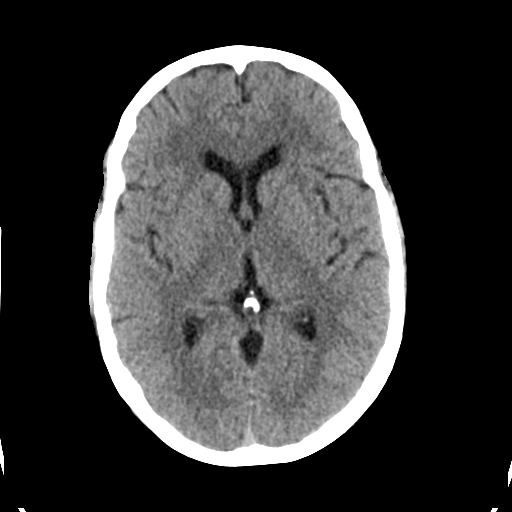
[im 16/29  brain]
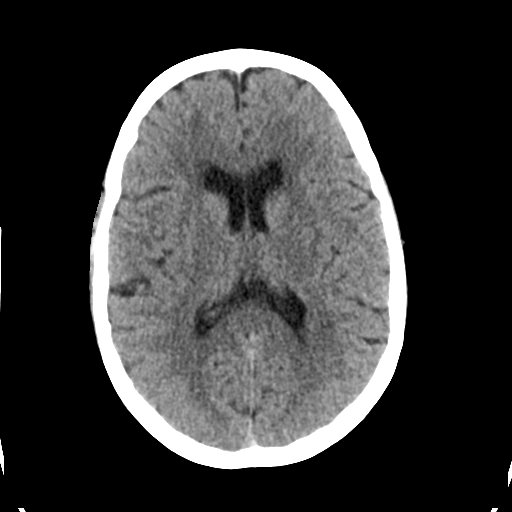
[im 16/29  bone]
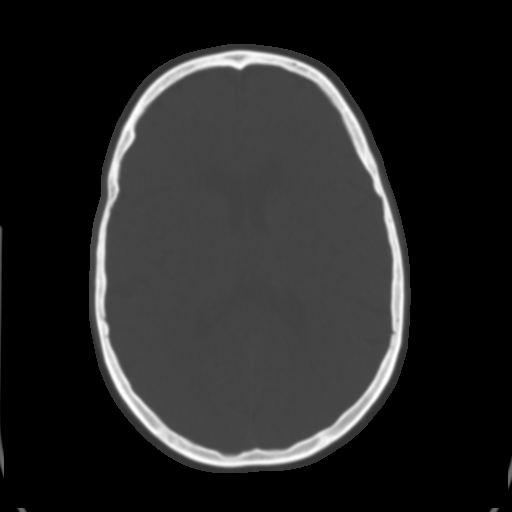
[im 18/29  brain]
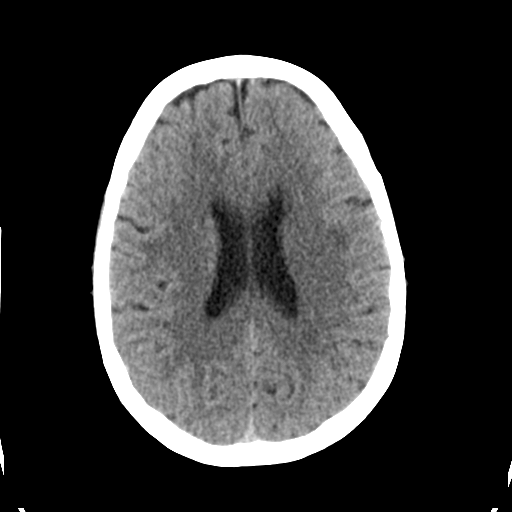
[im 19/29  brain]
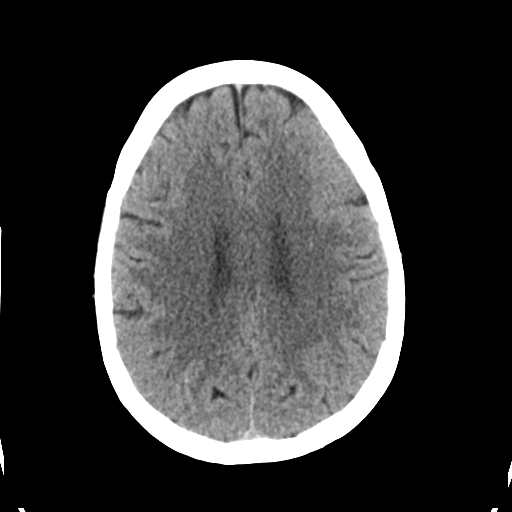
[im 21/29  brain]
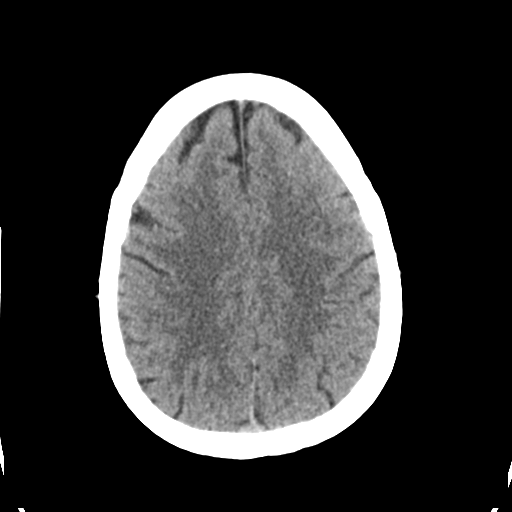
[im 23/29  brain]
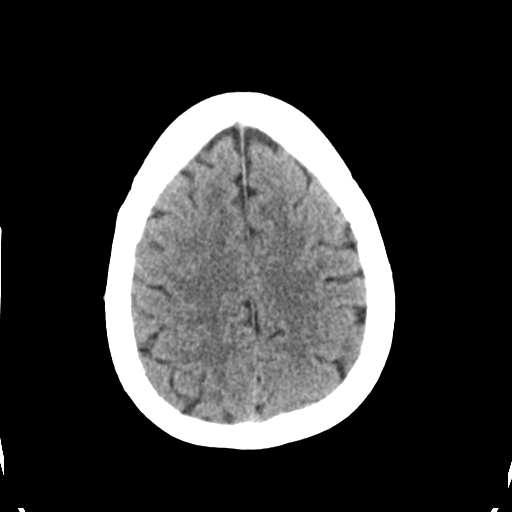
[im 23/29  bone]
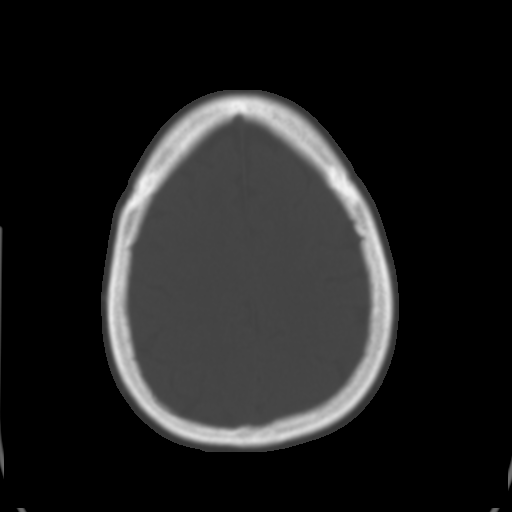
[im 24/29  brain]
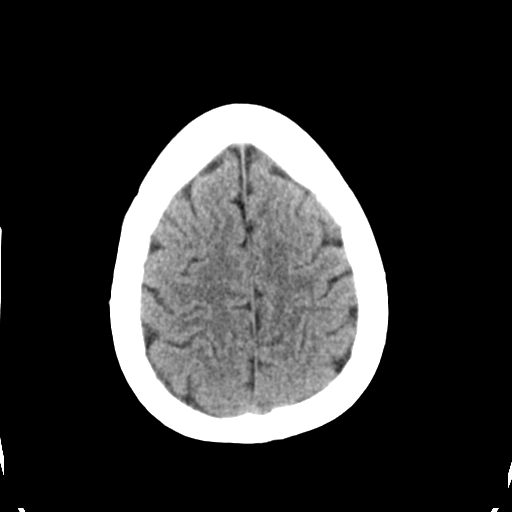
[im 26/29  brain]
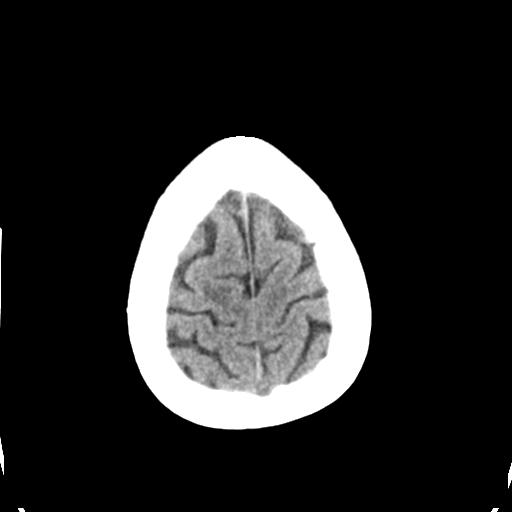
[im 28/29  brain]
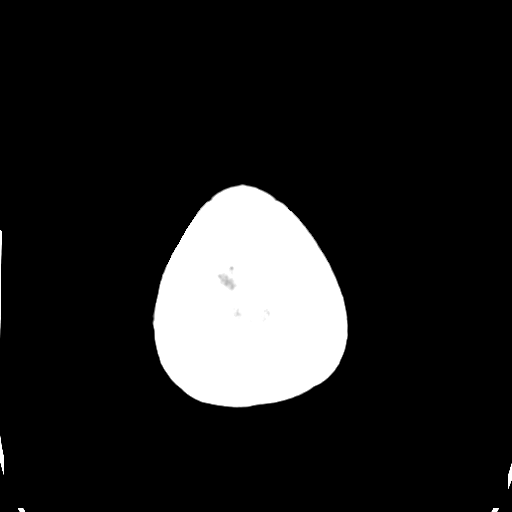

[16 of 29 positions shown; findings below may reference images not displayed]

FINDINGS: No acute hemorrhage, acute infarction, or mass lesion is
identified.  No midline shift.  No ventriculomegaly.  No skull
fracture.  Orbits and paranasal sinuses are unremarkable.
IMPRESSION: No acute intracranial finding.

## 2014-12-07 MED ORDER — LEVOTHYROXINE SODIUM 100 MCG PO TABS: 100.0000 ug | ORAL_TABLET | Freq: Every day | ORAL | Status: DC

## 2014-12-07 MED ORDER — VITAMIN D (ERGOCALCIFEROL) 1.25 MG (50000 UNIT) PO CAPS: 50000.0000 [IU] | ORAL_CAPSULE | ORAL | Status: DC

## 2014-12-07 NOTE — Assessment & Plan Note (Signed)
A: vit D def P: supplemental vit D

## 2014-12-07 NOTE — Addendum Note (Signed)
Addended by: Dessa PhiFUNCHES, Lataysha Vohra on: 12/07/2014 08:57 AM   Modules accepted: Orders

## 2014-12-08 ENCOUNTER — Ambulatory Visit: Payer: No Typology Code available for payment source | Admitting: Internal Medicine

## 2014-12-09 ENCOUNTER — Ambulatory Visit: Payer: 59 | Admitting: Internal Medicine

## 2014-12-13 ENCOUNTER — Telehealth: Payer: Self-pay | Admitting: *Deleted

## 2014-12-13 NOTE — Telephone Encounter (Signed)
-----   Message from Lora PaulaJosalyn C Funches, MD sent at 12/07/2014  8:55 AM EST ----- TSH elevated refilled synthroid a increase dose of 100 mcg daily  Vit D def, sent in vit D

## 2014-12-13 NOTE — Telephone Encounter (Signed)
Left voice message to return call  Rx at [facility] 

## 2014-12-21 ENCOUNTER — Telehealth: Payer: Self-pay | Admitting: Internal Medicine

## 2014-12-21 NOTE — Telephone Encounter (Signed)
Pt called to speak to nurse, did not want to give reason for call.

## 2014-12-22 ENCOUNTER — Encounter: Payer: Self-pay | Admitting: *Deleted

## 2014-12-22 DIAGNOSIS — R519 Headache, unspecified: Secondary | ICD-10-CM

## 2014-12-22 DIAGNOSIS — R51 Headache: Principal | ICD-10-CM

## 2014-12-22 NOTE — Progress Notes (Signed)
Pt is aware of her lab results and the medication changes. Pt said that she requested a referral to a neurologist and never got the call. The referral was not placed so I placed the referral for her.

## 2015-01-10 ENCOUNTER — Ambulatory Visit: Payer: 59 | Admitting: Neurology

## 2015-01-20 ENCOUNTER — Telehealth: Payer: Self-pay | Admitting: Internal Medicine

## 2015-01-20 NOTE — Telephone Encounter (Signed)
Diane from [facility] called requesting [employer] [facility] referral to be sent today, patient is scheduled for 01/21/15. Please f/u with Diane at [phone] ext.[phone].

## 2015-01-21 ENCOUNTER — Ambulatory Visit (INDEPENDENT_AMBULATORY_CARE_PROVIDER_SITE_OTHER): Payer: 59 | Admitting: Neurology

## 2015-01-21 ENCOUNTER — Encounter: Payer: Self-pay | Admitting: Neurology

## 2015-01-21 VITALS — BP 165/99 | HR 74 | Temp 98.0°F | Ht 62.0 in | Wt 200.0 lb

## 2015-01-21 DIAGNOSIS — G44209 Tension-type headache, unspecified, not intractable: Secondary | ICD-10-CM

## 2015-01-21 DIAGNOSIS — R9431 Abnormal electrocardiogram [ECG] [EKG]: Secondary | ICD-10-CM

## 2015-01-21 DIAGNOSIS — I4581 Long QT syndrome: Secondary | ICD-10-CM | POA: Diagnosis not present

## 2015-01-21 MED ORDER — NORTRIPTYLINE HCL 25 MG PO CAPS
25.0000 mg | ORAL_CAPSULE | Freq: Every day | ORAL | Status: DC
Start: 2015-01-21 — End: 2015-03-03

## 2015-01-21 NOTE — Progress Notes (Signed)
[facility]    Provider:  Dr Lucia Gaskins Referring Provider: Ambrose Finland, NP Primary Care Physician:  Holland Commons, NP  CC:  Headache  HPI:  Sandra Patrick is a 59 y.o. female here as a referral from Dr. Luna Glasgow for Headache  Patient reports headaches for her whole life. Reports a PMHx of HLD, HTN, Migraines, pre-diabetes., seizure More severe last 90 days. She has been evaluate by multiple neurologists and the only thing that helps is 3 BCs a day. She gets 3 severe headaches a month. Headaches more on the right side, pressure, no light sensitivity, no sound sensitivity. It hurts to move her head. Last hours. They are always in the morning. She has a bad disk in her neck. She has tried many, many medications. She doesn't remember what she tried in the past . She remember any names, just some colors of pills she was on. She tried to get into a pain clinic but was denied. Doesn't know if she snores. Denies excessive daytime somnolence.   Reviewed notes, labs and imaging from outside physicians, which showed:   MRI c-spine (personally reviewed images and agree with findings:)  IMPRESSION: 1. At C5-6 there is a moderate broad-based disc bulge more focal centrally with slight impression on the ventral cervical spinal cord. 2. Mild broad-based disc bulge at C2-3 and C4-5  Cmp with elevated ALT and decreased GFR  Review of Systems: Patient complains of symptoms per HPI as well as the following symptoms: No CP, No SOB. Pertinent negatives per HPI. All others negative.   History   Social History  . Marital Status: Divorced    Spouse Name: N/A  . Number of Children: N/A  . Years of Education: N/A   Occupational History  . Not on file.   Social History Main Topics  . Smoking status: Never Smoker   . Smokeless tobacco: Not on file  . Alcohol Use: No  . Drug Use: No  . Sexual Activity: Yes   Other Topics Concern  . Not on file   Social History Narrative     Family History  Problem Relation Age of Onset  . Heart disease Mother   . Heart disease Father   . Diabetes Brother   . Arthritis Brother     Past Medical History  Diagnosis Date  . Hypertension   . Irregular heart beat   . Restless leg syndrome   . Asthma   . Thyroid disease   . Headache     Past Surgical History  Procedure Laterality Date  . Eye surgery    . Tonsillectomy    . Cesarean section    . Abdominal hysterectomy    . Carpal tunnel release      Current Outpatient Prescriptions  Medication Sig Dispense Refill  . albuterol (PROVENTIL HFA;VENTOLIN HFA) 108 (90 BASE) MCG/ACT inhaler Inhale 2 puffs into the lungs every 6 (six) hours as needed for wheezing or shortness of breath. 1 Inhaler 5  . amLODipine (NORVASC) 10 MG tablet Take 1 tablet (10 mg total) by mouth daily. 90 tablet 3  . atorvastatin (LIPITOR) 10 MG tablet Take 1 tablet (10 mg total) by mouth daily. 90 tablet 1  . citalopram (CELEXA) 20 MG tablet Take 1 tablet (20 mg total) by mouth daily. 30 tablet 3  . clonazePAM (KLONOPIN) 1 MG tablet Take 1 mg by mouth as needed.    Marland Kitchen LANCETS ULTRA THIN MISC     . levothyroxine (SYNTHROID, LEVOTHROID) 100  MCG tablet Take 1 tablet (100 mcg total) by mouth daily before breakfast. 90 tablet 1  . lidocaine (LIDODERM) 5 %     . loratadine (CLARITIN) 10 MG tablet Take 1 tablet (10 mg total) by mouth daily. 30 tablet 5  . losartan-hydrochlorothiazide (HYZAAR) 50-12.5 MG per tablet Take 1 tablet by mouth daily. 90 tablet 3  . methocarbamol (ROBAXIN) 500 MG tablet Take 500 mg by mouth daily.    . naproxen (NAPROSYN) 500 MG tablet Take 1 tablet (500 mg total) by mouth 2 (two) times daily as needed for moderate pain. 60 tablet 0  . Nystatin Domestic POWD 1 each by Does not apply route 2 (two) times daily. 1 each 1  . benzocaine-menthol (SORE THROAT) 6-10 MG lozenge Take 1 lozenge by mouth as needed for sore throat. (Patient not taking: Reported on 01/21/2015) 100 tablet 0   . meloxicam (MOBIC) 7.5 MG tablet Take 7.5 mg by mouth daily.    . traMADol (ULTRAM) 50 MG tablet Take 1 tablet (50 mg total) by mouth every 12 (twelve) hours as needed for severe pain. (Patient not taking: Reported on 01/21/2015) 60 tablet 0  . Vitamin D, Ergocalciferol, (DRISDOL) 50000 UNITS CAPS capsule Take 1 capsule (50,000 Units total) by mouth every 7 (seven) days. For 8 weeks (Patient not taking: Reported on 01/21/2015) 8 capsule 0   No current facility-administered medications for this visit.    Allergies as of 01/21/2015 - Review Complete 01/21/2015  Allergen Reaction Noted  . Chocolate Nausea Only and Other (See Comments) 12/05/2012  . Diclofenac Itching 01/21/2015  . Gabapentin Itching 01/21/2015  . Ceclor [cefaclor] Rash 12/05/2012  . Codeine Swelling and Rash 12/05/2012  . Penicillins Rash 10/07/2011  . Sulfa antibiotics Rash 10/07/2011  . Zithromax [azithromycin] Rash 10/07/2011    Vitals: There were no vitals taken for this visit. Last Weight:  Wt Readings from Last 1 Encounters:  12/06/14 199 lb (90.266 kg)   Last Height:   Ht Readings from Last 1 Encounters:  12/06/14 5\' 2"  (1.575 m)    Physical exam: Exam: Gen: NAD, conversant, well nourised, obese, well groomed                     CV:  RRR, no MRG. No Carotid Bruits. No peripheral edema, warm, nontender Eyes: Conjunctivae clear without exudates or hemorrhage  Neuro: Detailed Neurologic Exam  Speech:   No aphasia or dysarthira Cognition:    The patient is oriented to person, place, and time;     recent and remote memory grossly intact;     language fluent;     Grossly intact attention, concentration,     fund of knowledge appears impaired Cranial Nerves:    The pupils are equal, round, and reactive to light. L 20/30, R 20/50. Attempted to visualize fundi, unable to due small pupils. Visual fields are full to finger confrontation. 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 Trigeminal sensation is intact and the muscles of mastication are normal. The face is symmetric. The palate elevates in the midline. Hearing intact. Voice is normal. Shoulder shrug is normal. The tongue has normal motion without fasciculations.   Coordination:    No dysmetria  Gait:    Wide based  Motor Observation:    No asymmetry, no atrophy, and no involuntary movements noted. Tone:    Normal muscle tone.    Posture:    Posture is normal. normal erect  Strength:    Strength is V/V in the upper and lower limbs.      Sensation: intact to LT     Reflex Exam:  DTR's:    Deep tendon reflexes in the upper and lower extremities are symmetric bilaterally.   Toes:    The toes are equivocal bilaterally.   Clonus:    Clonus is absent.      Assessment/Plan:  59 year old female with headaches all her life, evaluated by multiple neurologists, doesn't remember all the things she has tried for headahces, only thing that helps is 3 goody powders daily.   Warned not to take BCs daily as this can increase risk of serious bleeding risk Will start Nortriptyline  qhs. Can increase further. Ordered EKG and Personally reviewed EKG image. QTc normal  Can consider MRI of the brain if symptoms persist.   Naomie Dean, MD  [facility] [address] [place], [place] [address]  Phone [phone] Fax [phone]

## 2015-01-21 NOTE — Patient Instructions (Addendum)
Overall you are doing fairly well but I do want to suggest a few things today:   Remember to drink plenty of fluid, eat healthy meals and do not skip any meals. Try to eat protein with a every meal and eat a healthy snack such as fruit or nuts in between meals. Try to keep a regular sleep-wake schedule and try to exercise daily, particularly in the form of walking, 20-30 minutes a day, if you can.   As far as your medications are concerned, I would like to suggest: Nortriptyline 25mg . Repeat EKG after starting the medication within a week.  Needs close follow up with pcp for her elevated blood glucose. Most common side effects of Nortriptyline are drowsiness and dry mouth, take before bedtime, stop for anything concerning  I would like to see you back in 4 months, sooner if we need to. Please call us with any interim questions, concerns, problems, updates or refill requests.   Please also call us for any test results so we can go over those with you on the phone.  My clinical assistant and will answer any of your questions and relay your messages to me and also relay most of my messages to you.   Our phone number is [phone]. We also have an after hours call service for urgent matters and there is a physician on-call for urgent questions. For any emergencies you know to call 911 or go to the nearest emergency room

## 2015-01-22 ENCOUNTER — Emergency Department (HOSPITAL_COMMUNITY)
Admission: EM | Admit: 2015-01-22 | Discharge: 2015-01-22 | Disposition: A | Discharge status: Home / Self Care | Payer: 59 | Attending: Emergency Medicine | Admitting: Emergency Medicine

## 2015-01-22 ENCOUNTER — Encounter (HOSPITAL_COMMUNITY): Payer: Self-pay | Admitting: Emergency Medicine

## 2015-01-22 DIAGNOSIS — E785 Hyperlipidemia, unspecified: Secondary | ICD-10-CM | POA: Diagnosis not present

## 2015-01-22 DIAGNOSIS — J45909 Unspecified asthma, uncomplicated: Secondary | ICD-10-CM | POA: Insufficient documentation

## 2015-01-22 DIAGNOSIS — I10 Essential (primary) hypertension: Secondary | ICD-10-CM | POA: Insufficient documentation

## 2015-01-22 DIAGNOSIS — G2581 Restless legs syndrome: Secondary | ICD-10-CM | POA: Diagnosis not present

## 2015-01-22 DIAGNOSIS — Z880 Allergy status to penicillin: Secondary | ICD-10-CM | POA: Diagnosis not present

## 2015-01-22 DIAGNOSIS — G40909 Epilepsy, unspecified, not intractable, without status epilepticus: Secondary | ICD-10-CM | POA: Diagnosis not present

## 2015-01-22 DIAGNOSIS — I951 Orthostatic hypotension: Secondary | ICD-10-CM | POA: Diagnosis not present

## 2015-01-22 DIAGNOSIS — T50905A Adverse effect of unspecified drugs, medicaments and biological substances, initial encounter: Secondary | ICD-10-CM

## 2015-01-22 DIAGNOSIS — E079 Disorder of thyroid, unspecified: Secondary | ICD-10-CM | POA: Diagnosis not present

## 2015-01-22 DIAGNOSIS — Z79899 Other long term (current) drug therapy: Secondary | ICD-10-CM | POA: Insufficient documentation

## 2015-01-22 DIAGNOSIS — T43015A Adverse effect of tricyclic antidepressants, initial encounter: Secondary | ICD-10-CM | POA: Insufficient documentation

## 2015-01-22 DIAGNOSIS — R531 Weakness: Secondary | ICD-10-CM | POA: Diagnosis present

## 2015-01-22 DIAGNOSIS — Z791 Long term (current) use of non-steroidal anti-inflammatories (NSAID): Secondary | ICD-10-CM | POA: Insufficient documentation

## 2015-01-22 DIAGNOSIS — G43909 Migraine, unspecified, not intractable, without status migrainosus: Secondary | ICD-10-CM | POA: Diagnosis not present

## 2015-01-22 LAB — URINALYSIS, ROUTINE W REFLEX MICROSCOPIC
BILIRUBIN URINE: NEGATIVE
Glucose, UA: NEGATIVE mg/dL
Hgb urine dipstick: NEGATIVE
Ketones, ur: NEGATIVE mg/dL
Leukocytes, UA: NEGATIVE
Nitrite: NEGATIVE
Protein, ur: NEGATIVE mg/dL
Specific Gravity, Urine: 1.013 (ref 1.005–1.030)
Urobilinogen, UA: 0.2 mg/dL (ref 0.0–1.0)
pH: 7.5 (ref 5.0–8.0)

## 2015-01-22 MED ORDER — SODIUM CHLORIDE 0.9 % IV BOLUS (SEPSIS)
1000.0000 mL | Freq: Once | INTRAVENOUS | Status: AC
  Administered 2015-01-22: 1000 mL via INTRAVENOUS

## 2015-01-22 MED ORDER — BUTALBITAL-APAP-CAFFEINE 50-325-40 MG PO TABS
1.0000 | ORAL_TABLET | Freq: Once | ORAL | Status: AC
  Administered 2015-01-22: 1 via ORAL
  Filled 2015-01-22: qty 1

## 2015-01-22 NOTE — Discharge Instructions (Signed)
Dizziness and lightheadedness is a common reaction that you can have while taking Pamelor.  Please change position slowly to decrease side effect.  Discuss with your doctor on alternative medication to help with your headache . Avoid taking Pamelor in the mean time .

## 2015-01-22 NOTE — ED Notes (Signed)
Peer ems pt from home, co weakness/ fatigue, Hx hypertension . Per pt she has been compliant with her meds, yet she just started new med Nortripltyline. Pt co fatigue since this Am , pt believes that this is due to new med. Pt is alert and oriented x4. Pt is ambulatory. BP 152/102 HR 88. Pt also co headache, yet denies blurry vision. CBG 131 by ems.

## 2015-01-22 NOTE — ED Notes (Signed)
Bed: WA07 Expected date: 01/22/15 Expected time:  Means of arrival:  Comments: Hypertension

## 2015-01-22 NOTE — ED Notes (Signed)
Pt was able to ambulate for about 10 feet without assistance.

## 2015-01-22 NOTE — Telephone Encounter (Signed)
Pt has another pcp on her card . [facility] to [facility]yman [facility]egede [facility] and Pt show today to her appointment today and was reschedule. Thank You

## 2015-01-22 NOTE — ED Provider Notes (Signed)
CSN: [id number]     Arrival date & time 01/22/15  1453 History   First MD Initiated Contact with Patient 01/22/15 1508     Chief Complaint  Patient presents with  . Hypertension/ weakness      (Consider location/radiation/quality/duration/timing/severity/associated sxs/prior Treatment) HPI   59 year old female with hx of migraine, thyroid disease, HTN, seizure, irregular heart rate brought here via EMS for evaluation of fatigue.  Pt report she has recurrent headache and also has a lot of medication allergies.  Her PCP recently started pt on a new headache medication, Pamelor which she took it for the first time last night.  This morning she woke up feeling normal, went to get lunch and then took a nap before work.  She slept for 2 hrs, woke up to get ready but then felt fatigue.  She believes her sxs is due to her new medication.  She denies fever, chills, vision changes, headache, stiff neck, cp, sob, productive cough, abd pain, n/v/d, dysuria, focal numbness, weakness, dizziness, lightheadedness or difficulty thinking.  She report "I just feel so tired".  Her CBG was 131 by EMS.  EMS note report she c/o headache however pt currently denies.  She report feeling better since being in the ER.  She has no other complaints.    Past Medical History  Diagnosis Date  . Hypertension   . Irregular heart beat   . Restless leg syndrome   . Asthma   . Thyroid disease   . Headache   . Hyperlipemia   . Migraine   . Seizure    Past Surgical History  Procedure Laterality Date  . Eye surgery    . Tonsillectomy    . Cesarean section    . Abdominal hysterectomy    . Carpal tunnel release    . Adenoidectomy     Family History  Problem Relation Age of Onset  . Heart disease Mother   . Heart disease Father   . Diabetes Brother   . Arthritis Brother    History  Substance Use Topics  . Smoking status: Never Smoker   . Smokeless tobacco: Not on file  . Alcohol Use: No   OB History    No data  available     Review of Systems  All other systems reviewed and are negative.     Allergies  Chocolate; Diclofenac; Gabapentin; Ceclor; Codeine; Penicillins; Sulfa antibiotics; and Zithromax  Home Medications   Prior to Admission medications   Medication Sig Start Date End Date Taking? Authorizing Provider  albuterol (PROVENTIL HFA;VENTOLIN HFA) 108 (90 BASE) MCG/ACT inhaler Inhale 2 puffs into the lungs every 6 (six) hours as needed for wheezing or shortness of breath. 03/18/14  Yes Ambrose Finland, NP  amLODipine (NORVASC) 10 MG tablet Take 1 tablet (10 mg total) by mouth daily. 12/06/14  Yes Dessa Phi, MD  Aspirin-Salicylamide-Caffeine (BC HEADACHE POWDER PO) Take 2 each by mouth every 6 (six) hours as needed (migraine).   Yes Historical Provider, MD  atorvastatin (LIPITOR) 10 MG tablet Take 1 tablet (10 mg total) by mouth daily. 03/18/14  Yes Ambrose Finland, NP  citalopram (CELEXA) 20 MG tablet Take 1 tablet (20 mg total) by mouth daily. 01/12/14  Yes Richarda Overlie, MD  diclofenac (VOLTAREN) 75 MG EC tablet Take 75 mg by mouth 2 (two) times daily as needed for mild pain or moderate pain.  11/03/14  Yes Historical Provider, MD  LANCETS ULTRA THIN MISC Inject 1 each into  the skin 2 (two) times daily.  11/12/14  Yes Historical Provider, MD  levothyroxine (SYNTHROID, LEVOTHROID) 100 MCG tablet Take 1 tablet (100 mcg total) by mouth daily before breakfast. 12/07/14  Yes Josalyn Funches, MD  loratadine (CLARITIN) 10 MG tablet Take 1 tablet (10 mg total) by mouth daily. 03/18/14  Yes Ambrose Finland, NP  losartan-hydrochlorothiazide (HYZAAR) 50-12.5 MG per tablet Take 1 tablet by mouth daily. 12/06/14  Yes Josalyn Funches, MD  meloxicam (MOBIC) 7.5 MG tablet Take 7.5 mg by mouth daily. 09/02/14  Yes Historical Provider, MD  naproxen (NAPROSYN) 500 MG tablet Take 1 tablet (500 mg total) by mouth 2 (two) times daily as needed for moderate pain. 12/06/14  Yes Josalyn Funches, MD  nortriptyline (PAMELOR)  25 MG capsule Take 1 capsule (25 mg total) by mouth at bedtime. 01/21/15  Yes Anson Fret, MD  benzocaine-menthol (SORE THROAT) 6-10 MG lozenge Take 1 lozenge by mouth as needed for sore throat. 05/27/14   Quentin Angst, MD  Nystatin Domestic POWD 1 each by Does not apply route 2 (two) times daily. 05/20/14   Quentin Angst, MD  traMADol (ULTRAM) 50 MG tablet Take 1 tablet (50 mg total) by mouth every 12 (twelve) hours as needed for severe pain. Patient not taking: Reported on 01/21/2015 12/06/14   Dessa Phi, MD  Vitamin D, Ergocalciferol, (DRISDOL) 50000 UNITS CAPS capsule Take 1 capsule (50,000 Units total) by mouth every 7 (seven) days. For 8 weeks Patient not taking: Reported on 01/21/2015 12/07/14   Josalyn Funches, MD   BP 175/117 mmHg  Pulse 77  Temp(Src) 99 F (37.2 C) (Oral)  Resp 16  SpO2 96% Physical Exam  Constitutional: She is oriented to person, place, and time. She appears well-developed and well-nourished. No distress.  HENT:  Head: Atraumatic.  Eyes: Conjunctivae and EOM are normal. Pupils are equal, round, and reactive to light.  Neck: Normal range of motion. Neck supple.  No nuchal rigidity  Cardiovascular: Normal rate and regular rhythm.   Pulmonary/Chest: Effort normal and breath sounds normal.  Abdominal: Soft. Bowel sounds are normal. She exhibits no distension. There is no tenderness.  Neurological: She is alert and oriented to person, place, and time. GCS eye subscore is 4. GCS verbal subscore is 5. GCS motor subscore is 6.  Neurologic exam:  Speech clear, pupils equal round reactive to light, extraocular movements intact  Normal peripheral visual fields Cranial nerves III through XII normal including no facial droop Follows commands, moves all extremities x4, normal strength to bilateral upper and lower extremities at all major muscle groups including grip Sensation normal to light touch and pinprick Coordination intact, no limb ataxia,  finger-nose-finger normal Rapid alternating movements normal No pronator drift Gait normal   Skin: No rash noted.  Psychiatric: She has a normal mood and affect.  Nursing note and vitals reviewed.   ED Course  Procedures (including critical care time)  3:32 PM Pt report feeling fatigue prior to going to work and attributed to her new medication, Pamelor which she took last night.  At this time she is at her baseline.  She has no focal neuro deficit on exam.  Normal strength throughout.  Elevated BP but no CP/headache/SOB.  Will perform screening exam.  Recommend avoid Pamelor until she can f/u with her PCP.   I suspect sxs is related to Pamelor as one of the common side effect of the medication is drowsiness and lightheadedness especially with positional changes.  Recommend pt  to get up slowly to decrease side effect.    4:18 PM Pt with positive orthostatic vital sign, thus confirming the etiology of her sxs.  She is currently receiving IVF.  UA neg for infection, ECG unremarkable.   5:26 PM Pt ambulate without difficulty.  Stable for discharge.  fioricet given for headache.  Pt to f/u with PCP  Labs Review Labs Reviewed  URINALYSIS, ROUTINE W REFLEX MICROSCOPIC    Imaging Review No results found.   EKG Interpretation   Date/Time:  Saturday January 22 2015 15:39:23 EDT Ventricular Rate:  74 PR Interval:  170 QRS Duration: 85 QT Interval:  382 QTC Calculation: 424 R Axis:   75 Text Interpretation:  Sinus rhythm Low voltage, precordial leads  Borderline T abnormalities, anterior leads since last tracing no  significant change Confirmed by BELFI  MD, MELANIE ([id number]) on 01/22/2015  4:03:23 PM      MDM   Final diagnoses:  Orthostasis  Medication reaction, initial encounter    BP 154/94 mmHg  Pulse 79  Temp(Src) 99 F (37.2 C) (Oral)  Resp 16  SpO2 99%     Fayrene HelperBowie Harjot Dibello, PA-C 01/22/15 1728  Rolan BuccoMelanie Belfi, MD 01/23/15 [phone]

## 2015-01-25 ENCOUNTER — Ambulatory Visit: Payer: 59 | Admitting: Internal Medicine

## 2015-01-31 ENCOUNTER — Other Ambulatory Visit: Payer: 59

## 2015-02-01 ENCOUNTER — Telehealth: Payer: Self-pay | Admitting: Internal Medicine

## 2015-02-01 NOTE — Telephone Encounter (Signed)
Pt called to inquire about referral to neurologist.  Pt was informed, per referral note, that [facility] needs to [facility] to updated the listed PCP in order for referral to be [facility]. Pt reports not knowing why different PCP is listed on card, pt [facility]

## 2015-02-01 NOTE — Telephone Encounter (Signed)
[facility] done and documented in epic referral notes. She was suppose to assign a pcp  Thank you

## 2015-02-02 ENCOUNTER — Telehealth: Payer: Self-pay | Admitting: Neurology

## 2015-02-02 NOTE — Telephone Encounter (Signed)
I called the patient back.  Relayed providers message.  Patient verbalized understanding that she should only take this med at night.  She has an appt tomorrow at our office.  Says she will call back if anything further is needed, otherwise will be in tomorrow.  Says she may want to try adding a med to treat migraine at onset, but will discuss at appt tomorrow.

## 2015-02-02 NOTE — Telephone Encounter (Signed)
Patient called stating she's been having severe migraine and has taken 1 Rx nortriptyline (PAMELOR) 25 MG capsule and wants to know if she can take another one later tonight because she is still experiencing pain. Please call and advise.

## 2015-02-02 NOTE — Telephone Encounter (Signed)
Hi Sandra Patrick - I tried calling this patient and was unavailable. She is supposed to be taking Nortriptyline every night, not as acute management for migraines. We can increase the Nortriptyline to 50mg  every night. Just ensure she is taking it as prescribed. Would you call her back please? Thank you

## 2015-02-03 ENCOUNTER — Ambulatory Visit: Payer: 59 | Admitting: Neurology

## 2015-02-10 ENCOUNTER — Ambulatory Visit (INDEPENDENT_AMBULATORY_CARE_PROVIDER_SITE_OTHER): Payer: 59 | Admitting: Neurology

## 2015-02-10 ENCOUNTER — Telehealth: Payer: Self-pay | Admitting: Neurology

## 2015-02-10 DIAGNOSIS — R569 Unspecified convulsions: Secondary | ICD-10-CM | POA: Diagnosis not present

## 2015-02-10 NOTE — Procedures (Signed)
    History:  Sandra Patrick is a 59 year old patient with a history of hypertension, migraine headache, prediabetes and a history of seizures. The patient is having more frequent headaches, she is being evaluated for the seizure history.  This is a routine EEG. No skull defects are noted. Medications include albuterol, Norvasc, Lipitor, Celexa, diclofenac, Synthroid, Claritin, Hyzaar, Mobic, nortriptyline, Ultram, and vitamin D.   EEG classification: Normal awake and asleep  Description of the recording: The background rhythms of this recording consists of a fairly well modulated medium amplitude background activity of 8 Hz. As the record progresses, the patient initially is in the waking state, but appears to enter the early stage II sleep during the recording, with rudimentary sleep spindles and vertex sharp wave activity seen. During the wakeful state, photic stimulation is not performed due to technical difficulties. Hyperventilation was performed, and this results in a minimal buildup of the background rhythm activities without significant slowing seen. At no time during the recording does there appear to be evidence of spike or spike wave discharges or evidence of focal slowing. EKG monitor shows no evidence of cardiac rhythm abnormalities with a heart rate of 66.  Impression: This is a normal EEG recording in the waking and sleeping state. No evidence of ictal or interictal discharges were seen at any time during the recording.

## 2015-02-10 NOTE — Telephone Encounter (Signed)
Patient having possible side effects from medication.  Wants to talk to someone.  Since taking Nortriptyline HCL 25mg  she has been having moments where she is spacing out and getting dates and her buses mixed up.  Can someone call her please?  She wants to know if she should discontinue.

## 2015-02-10 NOTE — Telephone Encounter (Signed)
Called, left message for patient.  Advised her to stop the Nortrip if she feels it is causing her side effects. She can call back if she wants to try another medication or discuss. thanks

## 2015-02-28 NOTE — Telephone Encounter (Signed)
Patient is calling back in regard to the Rx Nortrip to discuss an alternated Rx.  She also needs the  results of your lithogram test.  Please call before 4:00 today or tomorrow.  Thanks!

## 2015-02-28 NOTE — Telephone Encounter (Signed)
PT stated BP increased while on nortriptyline and put her in a day dream. I checked previous telephone encounters and Dr. Lucia GaskinsAhern left message of 02/10/15 and advised pt to stop medication if it was causing side effects. Pt stated "I never got this message". I told her to always call back if she does not hear from us within 48 hours. I made appt for pt on 03/03/15 at 1:15pm and told her to arrive at 1:00pm for appt. Pt verbalized understanding.   Also told patient about normal EEG results. Pt verbalized understanding.

## 2015-03-03 ENCOUNTER — Ambulatory Visit (INDEPENDENT_AMBULATORY_CARE_PROVIDER_SITE_OTHER): Payer: 59 | Admitting: Neurology

## 2015-03-03 ENCOUNTER — Encounter: Payer: Self-pay | Admitting: Neurology

## 2015-03-03 ENCOUNTER — Ambulatory Visit: Payer: Self-pay | Admitting: Neurology

## 2015-03-03 VITALS — BP 154/96 | HR 87 | Temp 98.4°F | Ht 62.0 in | Wt 198.6 lb

## 2015-03-03 DIAGNOSIS — R0683 Snoring: Secondary | ICD-10-CM | POA: Diagnosis not present

## 2015-03-03 DIAGNOSIS — G4719 Other hypersomnia: Secondary | ICD-10-CM

## 2015-03-03 DIAGNOSIS — G2581 Restless legs syndrome: Secondary | ICD-10-CM | POA: Diagnosis not present

## 2015-03-03 DIAGNOSIS — R519 Headache, unspecified: Secondary | ICD-10-CM | POA: Insufficient documentation

## 2015-03-03 DIAGNOSIS — R51 Headache: Secondary | ICD-10-CM

## 2015-03-03 MED ORDER — NORTRIPTYLINE HCL 10 MG PO CAPS: 10.0000 mg | ORAL_CAPSULE | Freq: Every day | ORAL | Status: DC

## 2015-03-03 NOTE — Progress Notes (Signed)
[facility]    Provider:  Dr Lucia Gaskins Referring Provider: Ambrose Finland, NP Primary Care Physician:  Holland Commons, NP  CC:  Headaches  Visit 03/03/2015:   Sandra Patrick is a 59 y.o. female here as a referral from Dr. Luna Glasgow for Headache. Her headaches are refractory to multiple medications. She snores. She has excessive daytime sleepiness. Nocturnal headaches.  She falls asleep on the bus, nodding off all the time. She has restless leg syndrome. ESS 19, FSS51  Visit 01/21/2015:Patient reports headaches for her whole life. Reports a PMHx of HLD, HTN, Migraines, pre-diabetes., seizure More severe last 90 days. She has been evaluate by multiple neurologists and the only thing that helps is 3 BCs a day. She gets 3 severe headaches a month. Headaches more on the right side, pressure, no light sensitivity, no sound sensitivity. It hurts to move her head. Last hours. They are always in the morning. She has a bad disk in her neck. She has tried many, many medications. She doesn't remember what she tried in the past . She remember any names, just some colors of pills she was on. She tried to get into a pain clinic but was denied. Doesn't know if she snores. Denies excessive daytime somnolence.   Reviewed notes, labs and imaging from outside physicians, which showed:   MRI c-spine (personally reviewed images and agree with findings:)  IMPRESSION: 1. At C5-6 there is a moderate broad-based disc bulge more focal centrally with slight impression on the ventral cervical spinal cord. 2. Mild broad-based disc bulge at C2-3 and C4-5  Cmp with elevated ALT and decreased GFR  Review of Systems: Patient complains of symptoms per HPI as well as the following symptoms Restless leg. Pertinent negatives per HPI. All others negative.   History   Social History  . Marital Status: Divorced    Spouse Name: N/A  . Number of Children: 2  . Years of Education: 12   Occupational History  .  [employer]   Social History Main Topics  . Smoking status: Never Smoker   . Smokeless tobacco: Not on file  . Alcohol Use: No  . Drug Use: No  . Sexual Activity: Yes   Other Topics Concern  . Not on file   Social History Narrative   Lives at home with a friend.    Right handed.   Caffeine use: 1/2 cup coffee in the morning and 1 glass of tea per day.     Family History  Problem Relation Age of Onset  . Heart disease Mother   . Heart disease Father   . Diabetes Brother   . Arthritis Brother     Past Medical History  Diagnosis Date  . Hypertension   . Irregular heart beat   . Restless leg syndrome   . Asthma   . Thyroid disease   . Headache   . Hyperlipemia   . Migraine   . Seizure     Past Surgical History  Procedure Laterality Date  . Eye surgery    . Tonsillectomy    . Cesarean section    . Abdominal hysterectomy    . Carpal tunnel release    . Adenoidectomy      Current Outpatient Prescriptions  Medication Sig Dispense Refill  . albuterol (PROVENTIL HFA;VENTOLIN HFA) 108 (90 BASE) MCG/ACT inhaler Inhale 2 puffs into the lungs every 6 (six) hours as needed for wheezing or shortness of breath. 1 Inhaler 5  . amLODipine (  NORVASC) 10 MG tablet Take 1 tablet (10 mg total) by mouth daily. 90 tablet 3  . Aspirin-Salicylamide-Caffeine (BC HEADACHE POWDER PO) Take 2 each by mouth every 6 (six) hours as needed (migraine).    Marland Kitchen atorvastatin (LIPITOR) 10 MG tablet Take 1 tablet (10 mg total) by mouth daily. 90 tablet 1  . benzocaine-menthol (SORE THROAT) 6-10 MG lozenge Take 1 lozenge by mouth as needed for sore throat. 100 tablet 0  . citalopram (CELEXA) 20 MG tablet Take 1 tablet (20 mg total) by mouth daily. 30 tablet 3  . diclofenac (VOLTAREN) 75 MG EC tablet Take 75 mg by mouth 2 (two) times daily as needed for mild pain or moderate pain.   0  . LANCETS ULTRA THIN MISC Inject 1 each into the skin 2 (two) times daily.     Marland Kitchen levothyroxine (SYNTHROID,  LEVOTHROID) 100 MCG tablet Take 1 tablet (100 mcg total) by mouth daily before breakfast. 90 tablet 1  . loratadine (CLARITIN) 10 MG tablet Take 1 tablet (10 mg total) by mouth daily. 30 tablet 5  . losartan-hydrochlorothiazide (HYZAAR) 50-12.5 MG per tablet Take 1 tablet by mouth daily. 90 tablet 3  . meloxicam (MOBIC) 7.5 MG tablet Take 7.5 mg by mouth daily.    . naproxen (NAPROSYN) 500 MG tablet Take 1 tablet (500 mg total) by mouth 2 (two) times daily as needed for moderate pain. 60 tablet 0  . Nystatin Domestic POWD 1 each by Does not apply route 2 (two) times daily. 1 each 1  . nortriptyline (PAMELOR) 10 MG capsule Take 1 capsule (10 mg total) by mouth at bedtime. 30 capsule 6   No current facility-administered medications for this visit.    Allergies as of 03/03/2015 - Review Complete 03/03/2015  Allergen Reaction Noted  . Chocolate Nausea Only and Other (See Comments) 12/05/2012  . Diclofenac Itching 01/21/2015  . Gabapentin Itching 01/21/2015  . Ceclor [cefaclor] Rash 12/05/2012  . Codeine Swelling and Rash 12/05/2012  . Penicillins Rash 10/07/2011  . Sulfa antibiotics Rash 10/07/2011  . Zithromax [azithromycin] Rash 10/07/2011    Vitals: BP 154/96 mmHg  Pulse 87  Temp(Src) 98.4 F (36.9 C)  Ht  (1.575 m)  Wt 198 lb 9.6 oz (90.084 kg)  BMI 36.32 kg/m2 Last Weight:  Wt Readings from Last 1 Encounters:  03/03/15 198 lb 9.6 oz (90.084 kg)   Last Height:   Ht Readings from Last 1 Encounters:  03/03/15  (1.575 m)    Cranial Nerves:  The pupils are equal, round, and reactive to light. L 20/30, R 20/50. Attempted to visualize fundi, unable to due small pupils. Visual fields are full to finger confrontation. 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 Trigeminal sensation is intact and the muscles of mastication are normal. The face is symmetric. The palate elevates in the midline. Hearing intact. Voice is normal.  Shoulder shrug is normal. The tongue has normal motion without fasciculations     Assessment/Plan: 59 year old female with headaches all her life, evaluated by multiple neurologists, doesn't remember all the things she has tried for headahces, only thing that helps is 3 goody powders daily. Her headaches are refractory to multiple medications. She snores. She has excessive daytime sleepiness. Nocturnal headaches.  She falls asleep on the bus, nodding off all the time. She has restless leg syndrome. Explained untreated OSA coul dbe the cause of her headache and fatigue and also increases risk of serious complications  such as stroke.   Warned (again) not to take BCs daily as this can increase risk of serious bleeding risk Will decrease Nortriptyline to 10mg  qhs. Can increase further.  Ordered EKG at last appointment and Personally reviewed EKG image. QTc normal  Can consider MRI of the brain if symptoms persist.  Sleep study to evaluate for RLS/PLMS, OSA and Hypoventilation obesity syndrome (ESS 19, FSS 51)  Naomie DeanAntonia Vilda Zollner, MD  [facility] [address] [place], [place] [id number]  Phone [phone] Fax [phone]  A total of 30 minutes was spent in with this patient. Over half this time was spent face to face on counseling patient on the OSA and headache diagnosis and different therapeutic options available.

## 2015-03-03 NOTE — Patient Instructions (Signed)
Overall you are doing fairly well but I do want to suggest a few things today:   Remember to drink plenty of fluid, eat healthy meals and do not skip any meals. Try to eat protein with a every meal and eat a healthy snack such as fruit or nuts in between meals. Try to keep a regular sleep-wake schedule and try to exercise daily, particularly in the form of walking, 20-30 minutes a day, if you can.   As far as your medications are concerned, I would like to suggest: Nortriptyline 10mg  qhs. Please stop goody powder.  As far as diagnostic testing: Sleep study  I would like to see you back in 4-6 months, sooner if we need to. Please call us with any interim questions, concerns, problems, updates or refill requests.   Please also call us for any test results so we can go over those with you on the phone.  My clinical assistant and will answer any of your questions and relay your messages to me and also relay most of my messages to you.   Our phone number is [phone]. We also have an after hours call service for urgent matters and there is a physician on-call for urgent questions. For any emergencies you know to call 911 or go to the nearest emergency room

## 2015-04-04 ENCOUNTER — Telehealth: Payer: Self-pay

## 2015-04-04 ENCOUNTER — Institutional Professional Consult (permissible substitution): Payer: 59 | Admitting: Neurology

## 2015-04-04 NOTE — Telephone Encounter (Signed)
Patient did not show up to appt today.  

## 2015-04-13 ENCOUNTER — Encounter: Payer: Self-pay | Admitting: Neurology

## 2015-07-25 ENCOUNTER — Telehealth: Payer: Self-pay | Admitting: *Deleted

## 2015-07-25 ENCOUNTER — Ambulatory Visit: Payer: 59 | Admitting: Neurology

## 2015-07-25 NOTE — Telephone Encounter (Signed)
No showed follow up appointment. 

## 2015-07-26 ENCOUNTER — Encounter: Payer: Self-pay | Admitting: Neurology

## 2015-08-03 ENCOUNTER — Ambulatory Visit: Payer: Self-pay | Admitting: Neurology

## 2015-08-25 ENCOUNTER — Institutional Professional Consult (permissible substitution): Payer: Self-pay | Admitting: Neurology

## 2015-08-25 ENCOUNTER — Telehealth: Payer: Self-pay

## 2015-08-25 NOTE — Telephone Encounter (Signed)
Patient did not show for sleep consult. This is her 2nd no show with Dr. Frances FurbishAthar.

## 2015-09-28 NOTE — Telephone Encounter (Signed)
error 

## 2016-10-13 ENCOUNTER — Encounter (HOSPITAL_COMMUNITY): Payer: Self-pay

## 2016-10-13 ENCOUNTER — Emergency Department (HOSPITAL_COMMUNITY)
Admission: EM | Admit: 2016-10-13 | Discharge: 2016-10-13 | Disposition: A | Discharge status: Home / Self Care | Payer: Medicaid Other | Attending: Emergency Medicine | Admitting: Emergency Medicine

## 2016-10-13 DIAGNOSIS — J45909 Unspecified asthma, uncomplicated: Secondary | ICD-10-CM | POA: Diagnosis not present

## 2016-10-13 DIAGNOSIS — Z7982 Long term (current) use of aspirin: Secondary | ICD-10-CM | POA: Diagnosis not present

## 2016-10-13 DIAGNOSIS — Z79899 Other long term (current) drug therapy: Secondary | ICD-10-CM | POA: Diagnosis not present

## 2016-10-13 DIAGNOSIS — R0981 Nasal congestion: Secondary | ICD-10-CM | POA: Diagnosis present

## 2016-10-13 DIAGNOSIS — I10 Essential (primary) hypertension: Secondary | ICD-10-CM | POA: Insufficient documentation

## 2016-10-13 MED ORDER — FLUTICASONE PROPIONATE 50 MCG/ACT NA SUSP: 2.0000 | Freq: Every day | NASAL | 2 refills | Status: DC

## 2016-10-13 NOTE — Discharge Instructions (Signed)
You have been seen for nasal congestion. Treatment includes sinus rinses and decongestants, such as Mucinex. Ibuprofen or Tylenol for pain. Flonase nasal spray may also help symptoms. Avoid known triggers such as cigarette smoke. Use saline nasal spray and/or petroleum jelly to avoid drying out the nasal passages. Follow up with your primary care provider for any further management of this issue.

## 2016-10-13 NOTE — ED Notes (Signed)
Declined W/C at D/C and was escorted to lobby by RN. 

## 2016-10-13 NOTE — ED Provider Notes (Signed)
[facility]EMERGENCY DEPT Provider Note   CSN: [id number] Arrival date & time: 10/13/16  1046  By signing my name below, I, Sandra Patrick, attest that this documentation has been prepared under the direction and in the presence of Shawn Joy, PA-C. Electronically Signed: Sonum Patrick, Neurosurgeoncribe. 10/13/16. 11:36 AM.  History   Chief Complaint Chief Complaint  Patient presents with  . Nasal Congestion    The history is provided by the patient. No language interpreter was used.     HPI Comments: Sandra Patrick is a 61 y.o. female who presents to the Emergency Department complaining of unchanged nasal congestion and dryness with associated right ear pressure beginning early this morning. She has not tried any OTC medications for her symptoms. She states she lives with someone who smokes and is exposed to cold air waiting for transportation so she believes that is contributing to her symptoms. She denies ear drainage, fever, cough, CP, SOB, or any other symptoms at this time.    Past Medical History:  Diagnosis Date  . Asthma   . Headache   . Hyperlipemia   . Hypertension   . Irregular heart beat   . Migraine   . Restless leg syndrome   . Seizure (HCC)   . Thyroid disease     Patient Active Problem List   Diagnosis Date Noted  . Snoring 03/03/2015  . Excessive daytime sleepiness 03/03/2015  . Nocturnal headaches 03/03/2015  . RLS (restless legs syndrome) 03/03/2015  . Vitamin D deficiency 12/07/2014  . 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 Chronic right-sided headache 12/06/2014  . Bilateral low back pain without sciatica 05/20/2014  . Left hand weakness 12/25/2013  . Near syncope 06/25/2013  . Hypothyroidism 05/28/2013  . Arthritis 05/28/2013  . Anxiety state, unspecified 03/13/2013  . HTN (hypertension) 03/13/2013    Past Surgical History:  Procedure Laterality Date  . ABDOMINAL HYSTERECTOMY    . ADENOIDECTOMY    . CARPAL TUNNEL RELEASE    . CESAREAN SECTION    . EYE  SURGERY    . TONSILLECTOMY      OB History    No data available       Home Medications    Prior to Admission medications   Medication Sig Start Date End Date Taking? Authorizing Provider  albuterol (PROVENTIL HFA;VENTOLIN HFA) 108 (90 BASE) MCG/ACT inhaler Inhale 2 puffs into the lungs every 6 (six) hours as needed for wheezing or shortness of breath. 03/18/14   Ambrose FinlandValerie A Keck, NP  amLODipine (NORVASC) 10 MG tablet Take 1 tablet (10 mg total) by mouth daily. 12/06/14   Dessa PhiJosalyn Funches, MD  Aspirin-Salicylamide-Caffeine (BC HEADACHE POWDER PO) Take 2 each by mouth every 6 (six) hours as needed (migraine).    Historical Provider, MD  atorvastatin (LIPITOR) 10 MG tablet Take 1 tablet (10 mg total) by mouth daily. 03/18/14   Ambrose FinlandValerie A Keck, NP  benzocaine-menthol (SORE THROAT) 6-10 MG lozenge Take 1 lozenge by mouth as needed for sore throat. 05/27/14   Quentin Angstlugbemiga E Jegede, MD  citalopram (CELEXA) 20 MG tablet Take 1 tablet (20 mg total) by mouth daily. 01/12/14   Richarda OverlieNayana Abrol, MD  diclofenac (VOLTAREN) 75 MG EC tablet Take 75 mg by mouth 2 (two) times daily as needed for mild pain or moderate pain.  11/03/14   Historical Provider, MD  fluticasone (FLONASE) 50 MCG/ACT nasal spray Place 2 sprays into both nostrils daily. 10/13/16   Shawn C Joy, PA-C  LANCETS ULTRA THIN MISC  Inject 1 each into the skin 2 (two) times daily.  11/12/14   Historical Provider, MD  levothyroxine (SYNTHROID, LEVOTHROID) 100 MCG tablet Take 1 tablet (100 mcg total) by mouth daily before breakfast. 12/07/14   Dessa Phi, MD  loratadine (CLARITIN) 10 MG tablet Take 1 tablet (10 mg total) by mouth daily. 03/18/14   Ambrose Finland, NP  losartan-hydrochlorothiazide (HYZAAR) 50-12.5 MG per tablet Take 1 tablet by mouth daily. 12/06/14   Josalyn Funches, MD  meloxicam (MOBIC) 7.5 MG tablet Take 7.5 mg by mouth daily. 09/02/14   Historical Provider, MD  naproxen (NAPROSYN) 500 MG tablet Take 1 tablet (500 mg total) by mouth 2 (two) times  daily as needed for moderate pain. 12/06/14   Dessa Phi, MD  nortriptyline (PAMELOR) 10 MG capsule Take 1 capsule (10 mg total) by mouth at bedtime. 03/03/15   Anson Fret, MD  Nystatin Domestic POWD 1 each by Does not apply route 2 (two) times daily. 05/20/14   Quentin Angst, MD    Family History Family History  Problem Relation Age of Onset  . Heart disease Mother   . Heart disease Father   . Diabetes Brother   . Arthritis Brother     Social History Social History  Substance Use Topics  . Smoking status: Never Smoker  . Smokeless tobacco: Never Used  . Alcohol use No     Allergies   Chocolate; Diclofenac; Gabapentin; Ceclor [cefaclor]; Codeine; Penicillins; Sulfa antibiotics; and Zithromax [azithromycin]   Review of Systems Review of Systems  Constitutional: Negative for fever.  HENT: Positive for congestion.        Ear pressure  Respiratory: Negative for cough and shortness of breath.   Cardiovascular: Negative for chest pain.  Gastrointestinal: Negative for abdominal pain.     Physical Exam Updated Vital Signs BP (!) 164/104 (BP Location: Left Arm)   Pulse 69   Temp 99 F (37.2 C) (Oral)   Resp 20   Ht 5\' 2"  (1.575 m)   Wt 81.6 kg   SpO2 99%   BMI 32.92 kg/m   Physical Exam  Constitutional: She appears well-developed and well-nourished. No distress.  HENT:  Head: Normocephalic and atraumatic.  Right Ear: Tympanic membrane and ear canal normal.  Left Ear: Tympanic membrane and ear canal normal.  Nose: Nose normal.  Mouth/Throat: Oropharynx is clear and moist.  Eyes: Conjunctivae are normal.  Neck: Normal range of motion. Neck supple.  Cardiovascular: Normal rate, regular rhythm and intact distal pulses.   Pulmonary/Chest: Effort normal and breath sounds normal. No respiratory distress.  Lymphadenopathy:    She has no cervical adenopathy.  Neurological: She is alert.  Skin: Skin is warm and dry. She is not diaphoretic.  Psychiatric:  She has a normal mood and affect. Her behavior is normal.  Nursing note and vitals reviewed.    ED Treatments / Results  DIAGNOSTIC STUDIES: Oxygen Saturation is 99% on RA, normal by my interpretation.    COORDINATION OF CARE: 11:36 AM Discussed treatment plan with pt at bedside and pt agreed to plan.    Labs (all labs ordered are listed, but only abnormal results are displayed) Labs Reviewed - No data to display  EKG  EKG Interpretation None       Radiology No results found.  Procedures Procedures (including critical care time)  Medications Ordered in ED Medications - No data to display   Initial Impression / Assessment and Plan / ED Course  I have reviewed  the triage vital signs and the nursing notes.  Pertinent labs & imaging results that were available during my care of the patient were reviewed by me and considered in my medical decision making (see chart for details).  Clinical Course     Patient presents with nasal congestion and ear pressure. No abnormalities found on exam. No indication for antibiotics at this time. Patient was focused on the need for a work note during her evaluation. Treatment regimen discussed. Medication regimen chosen using in [facility] formulary as a guide, per patient request. PCP follow-up for further management.      Final Clinical Impressions(s) / ED Diagnoses   Final diagnoses:  Nasal congestion    New Prescriptions Discharge Medication List as of 10/13/2016 11:45 AM    START taking these medications   Details  fluticasone (FLONASE) 50 MCG/ACT nasal spray Place 2 sprays into both nostrils daily., Starting Sat 10/13/2016, Print       I personally performed the services described in this documentation, which was scribed in my presence. The recorded information has been reviewed and is accurate.   Anselm Pancoast, PA-C 10/13/16 1159    Nira Conn, MD 10/13/16 [phone]

## 2016-10-13 NOTE — ED Triage Notes (Signed)
Pt presents with onset of nasal congestion and R ear pain that began early this morning.  Pt denies any cough.

## 2016-11-01 ENCOUNTER — Emergency Department (HOSPITAL_COMMUNITY): Payer: Medicaid Other

## 2016-11-01 ENCOUNTER — Emergency Department (HOSPITAL_COMMUNITY)
Admission: EM | Admit: 2016-11-01 | Discharge: 2016-11-01 | Disposition: A | Discharge status: Home / Self Care | Payer: Medicaid Other | Attending: Emergency Medicine | Admitting: Emergency Medicine

## 2016-11-01 ENCOUNTER — Encounter (HOSPITAL_COMMUNITY): Payer: Self-pay

## 2016-11-01 DIAGNOSIS — J189 Pneumonia, unspecified organism: Secondary | ICD-10-CM

## 2016-11-01 DIAGNOSIS — I10 Essential (primary) hypertension: Secondary | ICD-10-CM | POA: Diagnosis not present

## 2016-11-01 DIAGNOSIS — J181 Lobar pneumonia, unspecified organism: Secondary | ICD-10-CM | POA: Insufficient documentation

## 2016-11-01 DIAGNOSIS — Z79899 Other long term (current) drug therapy: Secondary | ICD-10-CM | POA: Insufficient documentation

## 2016-11-01 DIAGNOSIS — J45909 Unspecified asthma, uncomplicated: Secondary | ICD-10-CM | POA: Diagnosis not present

## 2016-11-01 DIAGNOSIS — E039 Hypothyroidism, unspecified: Secondary | ICD-10-CM | POA: Insufficient documentation

## 2016-11-01 DIAGNOSIS — Z7982 Long term (current) use of aspirin: Secondary | ICD-10-CM | POA: Diagnosis not present

## 2016-11-01 DIAGNOSIS — R509 Fever, unspecified: Secondary | ICD-10-CM | POA: Diagnosis present

## 2016-11-01 LAB — CBC WITH DIFFERENTIAL/PLATELET
Basophils Absolute: 0 10*3/uL (ref 0.0–0.1)
Basophils Relative: 0 %
EOS ABS: 0 10*3/uL (ref 0.0–0.7)
EOS PCT: 0 %
HCT: 46.1 % — ABNORMAL HIGH (ref 36.0–46.0)
Hemoglobin: 14.9 g/dL (ref 12.0–15.0)
Lymphocytes Relative: 13 %
Lymphs Abs: 0.7 10*3/uL (ref 0.7–4.0)
MCH: 30.1 pg (ref 26.0–34.0)
MCHC: 32.3 g/dL (ref 30.0–36.0)
MCV: 93.1 fL (ref 78.0–100.0)
MONOS PCT: 22 %
Monocytes Absolute: 1.2 10*3/uL — ABNORMAL HIGH (ref 0.1–1.0)
Neutro Abs: 3.4 10*3/uL (ref 1.7–7.7)
Neutrophils Relative %: 65 %
PLATELETS: 175 10*3/uL (ref 150–400)
RBC: 4.95 MIL/uL (ref 3.87–5.11)
RDW: 13.4 % (ref 11.5–15.5)
WBC: 5.3 10*3/uL (ref 4.0–10.5)

## 2016-11-01 LAB — COMPREHENSIVE METABOLIC PANEL
ALT: 47 U/L (ref 14–54)
AST: 57 U/L — AB (ref 15–41)
Albumin: 3.7 g/dL (ref 3.5–5.0)
Alkaline Phosphatase: 57 U/L (ref 38–126)
Anion gap: 12 (ref 5–15)
BUN: 10 mg/dL (ref 6–20)
CO2: 22 mmol/L (ref 22–32)
CREATININE: 1.04 mg/dL — AB (ref 0.44–1.00)
Calcium: 9.2 mg/dL (ref 8.9–10.3)
Chloride: 107 mmol/L (ref 101–111)
GFR calc Af Amer: >60 mL/min (ref >60)
GFR, EST NON AFRICAN AMERICAN: 58 mL/min — AB (ref >60)
Glucose, Bld: 123 mg/dL — ABNORMAL HIGH (ref 65–99)
Potassium: 3.7 mmol/L (ref 3.5–5.1)
Sodium: 141 mmol/L (ref 135–145)
Total Bilirubin: 1.1 mg/dL (ref 0.3–1.2)
Total Protein: 6.7 g/dL (ref 6.5–8.1)

## 2016-11-01 LAB — I-STAT CG4 LACTIC ACID, ED: Lactic Acid, Venous: 1.46 mmol/L (ref 0.5–1.9)

## 2016-11-01 MED ORDER — ACETAMINOPHEN 325 MG PO TABS
650.0000 mg | ORAL_TABLET | Freq: Once | ORAL | Status: AC | PRN
  Administered 2016-11-01: 650 mg via ORAL

## 2016-11-01 MED ORDER — ACETAMINOPHEN 325 MG PO TABS
ORAL_TABLET | ORAL | Status: AC
  Filled 2016-11-01: qty 2

## 2016-11-01 MED ORDER — LEVOFLOXACIN 500 MG PO TABS: 750.0000 mg | ORAL_TABLET | Freq: Every day | ORAL | 0 refills | Status: DC

## 2016-11-01 NOTE — ED Notes (Addendum)
Attempted to call for patient in lobby with no response.

## 2016-11-01 NOTE — ED Notes (Signed)
Called for patient in lobby with no response.

## 2016-11-01 NOTE — ED Notes (Signed)
Called patient on home and mobile number with no response.  No option to leave a voicemail

## 2016-11-01 NOTE — ED Provider Notes (Signed)
[facility] DEPT Provider Note   CSN: [id number] Arrival date & time: 11/01/16  1521 By signing my name below, I, Ryan Long, attest that this documentation has been prepared under the direction and in the presence of Lavera Guise, MD . Electronically Signed: Garen Lah, Scribe. 11/01/2016. 7:26 PM.  History   Chief Complaint Chief Complaint  Patient presents with  . Fever  . Cough    HPI HPI Comments:  Sandra Patrick is a 61 y.o. female with a h/o asthma and HTN, who presents to the Emergency Department complaining of a persistent, gradually worsening dry cough that began yesterday. She notes associated symptoms of cold chills, subjective fever, congestion, and sensation of right ear congestion. No alleviating or exacerbating factors noted. She also notes she has not been recently hospitalized. Pt denies abdominal pain, dysuria, nausea, vomiting, diarrhea, chest pain, SOB, LOC, and any other associated symptoms at this time.   The history is provided by the patient. No language interpreter was used.    Past Medical History:  Diagnosis Date  . Asthma   . Headache   . Hyperlipemia   . Hypertension   . Irregular heart beat   . Migraine   . Restless leg syndrome   . Seizure (HCC)   . Thyroid disease     Patient Active Problem List   Diagnosis Date Noted  . Snoring 03/03/2015  . Excessive daytime sleepiness 03/03/2015  . Nocturnal headaches 03/03/2015  . RLS (restless legs syndrome) 03/03/2015  . Vitamin D deficiency 12/07/2014  . 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 Chronic right-sided headache 12/06/2014  . Bilateral low back pain without sciatica 05/20/2014  . Left hand weakness 12/25/2013  . Near syncope 06/25/2013  . Hypothyroidism 05/28/2013  . Arthritis 05/28/2013  . Anxiety state, unspecified 03/13/2013  . HTN (hypertension) 03/13/2013    Past Surgical History:  Procedure Laterality Date  . ABDOMINAL HYSTERECTOMY    . ADENOIDECTOMY    . CARPAL  TUNNEL RELEASE    . CESAREAN SECTION    . EYE SURGERY    . TONSILLECTOMY      OB History    No data available       Home Medications    Prior to Admission medications   Medication Sig Start Date End Date Taking? Authorizing Provider  albuterol (PROVENTIL HFA;VENTOLIN HFA) 108 (90 BASE) MCG/ACT inhaler Inhale 2 puffs into the lungs every 6 (six) hours as needed for wheezing or shortness of breath. 03/18/14   Ambrose Finland, NP  amLODipine (NORVASC) 10 MG tablet Take 1 tablet (10 mg total) by mouth daily. 12/06/14   Dessa Phi, MD  Aspirin-Salicylamide-Caffeine (BC HEADACHE POWDER PO) Take 2 each by mouth every 6 (six) hours as needed (migraine).    Historical Provider, MD  atorvastatin (LIPITOR) 10 MG tablet Take 1 tablet (10 mg total) by mouth daily. 03/18/14   Ambrose Finland, NP  benzocaine-menthol (SORE THROAT) 6-10 MG lozenge Take 1 lozenge by mouth as needed for sore throat. 05/27/14   Quentin Angst, MD  citalopram (CELEXA) 20 MG tablet Take 1 tablet (20 mg total) by mouth daily. 01/12/14   Richarda Overlie, MD  diclofenac (VOLTAREN) 75 MG EC tablet Take 75 mg by mouth 2 (two) times daily as needed for mild pain or moderate pain.  11/03/14   Historical Provider, MD  fluticasone (FLONASE) 50 MCG/ACT nasal spray Place 2 sprays into both nostrils daily. 10/13/16   Anselm Pancoast, PA-C  LANCETS ULTRA THIN MISC Inject 1 each into the skin 2 (two) times daily.  11/12/14   Historical Provider, MD  levofloxacin (LEVAQUIN) 500 MG tablet Take 1.5 tablets (750 mg total) by mouth daily. 11/01/16   Lavera Guise, MD  levothyroxine (SYNTHROID, LEVOTHROID) 100 MCG tablet Take 1 tablet (100 mcg total) by mouth daily before breakfast. 12/07/14   Dessa Phi, MD  loratadine (CLARITIN) 10 MG tablet Take 1 tablet (10 mg total) by mouth daily. 03/18/14   Ambrose Finland, NP  losartan-hydrochlorothiazide (HYZAAR) 50-12.5 MG per tablet Take 1 tablet by mouth daily. 12/06/14   Josalyn Funches, MD  meloxicam (MOBIC)  7.5 MG tablet Take 7.5 mg by mouth daily. 09/02/14   Historical Provider, MD  naproxen (NAPROSYN) 500 MG tablet Take 1 tablet (500 mg total) by mouth 2 (two) times daily as needed for moderate pain. 12/06/14   Dessa Phi, MD  nortriptyline (PAMELOR) 10 MG capsule Take 1 capsule (10 mg total) by mouth at bedtime. 03/03/15   Anson Fret, MD  Nystatin Domestic POWD 1 each by Does not apply route 2 (two) times daily. 05/20/14   Quentin Angst, MD    Family History Family History  Problem Relation Age of Onset  . Heart disease Mother   . Heart disease Father   . Diabetes Brother   . Arthritis Brother     Social History Social History  Substance Use Topics  . Smoking status: Never Smoker  . Smokeless tobacco: Never Used  . Alcohol use No     Allergies   Chocolate; Diclofenac; Gabapentin; Ceclor [cefaclor]; Codeine; Penicillins; Sulfa antibiotics; and Zithromax [azithromycin]   Review of Systems Review of Systems  10/14 systems reviewed and are negative other than those stated in the HPI  Physical Exam Updated Vital Signs BP 129/82   Pulse 88   Temp 98.5 F (36.9 C)   Resp 16   Ht 5\' 1"  (1.549 m)   Wt 180 lb (81.6 kg)   SpO2 94%   BMI 34.01 kg/m   Physical Exam Physical Exam  Nursing note and vitals reviewed. Constitutional: Well developed, well nourished, non-toxic, and in no acute distress Head: Normocephalic and atraumatic.  Mouth/Throat: Oropharynx is clear and moist.  Neck: Normal range of motion. Neck supple.  Cardiovascular: Normal rate and regular rhythm.   Pulmonary/Chest: Effort normal and breath sounds normal.  Abdominal: Soft. There is no tenderness. There is no rebound and no guarding.  Musculoskeletal: Normal range of motion.  Neurological: Alert, no facial droop, fluent speech, moves all extremities symmetrically Skin: Skin is warm and dry.  Psychiatric: Cooperative  ED Treatments / Results  DIAGNOSTIC STUDIES:  Oxygen Saturation is  100% on RA, normal by my interpretation.    COORDINATION OF CARE:  7:10 PM Discussed treatment plan with pt at bedside and pt agreed to plan.  Labs (all labs ordered are listed, but only abnormal results are displayed) Labs Reviewed  COMPREHENSIVE METABOLIC PANEL - Abnormal; Notable for the following:       Result Value   Glucose, Bld 123 (*)    Creatinine, Ser 1.04 (*)    AST 57 (*)    GFR calc non Af Amer 58 (*)    All other components within normal limits  CBC WITH DIFFERENTIAL/PLATELET - Abnormal; Notable for the following:    HCT 46.1 (*)    Monocytes Absolute 1.2 (*)    All other components within normal limits  I-STAT CG4 LACTIC ACID, ED  I-STAT CG4 LACTIC ACID, ED   EKG  EKG Interpretation None      Radiology Dg Chest 2 View  Result Date: 11/01/2016 CLINICAL DATA:  One day of cough.  History of asthma.  Nonsmoker. EXAM: CHEST  2 VIEW COMPARISON:  PA and lateral chest x-ray dated October 25, 2014 FINDINGS: The lungs are borderline hypoinflated. There is abnormal interstitial density in the left mid and lower lung which is new. There is no pleural effusion. The heart and pulmonary vascularity are normal. There is tortuosity of the descending thoracic aorta. The trachea is midline. There is mild multilevel degenerative disc disease of the thoracic spine. IMPRESSION: New interstitial pneumonia in the left mid and lower lung. Followup PA and lateral chest X-ray is recommended in 3-4 weeks following trial of antibiotic therapy to ensure resolution. Electronically Signed   By: David  Swazil[place] M.D.   On: 11/01/2016 16:03    Procedures Procedures   Medications Ordered in ED Medications  acetaminophen (TYLENOL) 325 MG tablet (not administered)  acetaminophen (TYLENOL) tablet 650 mg (650 mg Oral Given 11/01/16 1531)    Initial Impression / Assessment and Plan / ED Course  I have reviewed the triage vital signs and the nursing notes.  Pertinent labs & imaging results that were  available during my care of the patient were reviewed by me and considered in my medical decision making (see chart for details).     61 year old female who presents with one day of fever and cough. She was febrile in triage and mildly tachycardic, but after antipyretics of fever resolved and tachycardia resolved. She is otherwise well-appearing and in no acute distress. Blood work obtained in triage reviewed and reassuring without major signs of sepsis. Chest x-ray visualized and does show evidence of infiltrate in the left lower lobe suggestive of pneumonia. No risk factors for healthcare associated pneumonia, but multiple allergies to penicillins, azithromycin and sulfa antibiotics. As a result, we will treat with Levaquin. At this time she is felt to be stable for outpatient management of her symptoms. Strict return and follow-up instructions reviewed. She expressed understanding of all discharge instructions and felt comfortable with the plan of care.   Final Clinical Impressions(s) / ED Diagnoses   Final diagnoses:  Community acquired pneumonia of left lower lobe of lung (HCC)  Lobar pneumonia (HCC)    New Prescriptions New Prescriptions   LEVOFLOXACIN (LEVAQUIN) 500 MG TABLET    Take 1.5 tablets (750 mg total) by mouth daily.    I personally performed the services described in this documentation, which was scribed in my presence. The recorded information has been reviewed and is accurate.     Lavera Guiseana Duo Daphne Karrer, MD 11/01/16 [phone]

## 2016-11-01 NOTE — ED Notes (Signed)
Attempted to call patient again at home, no response.  No voicemail.

## 2016-11-01 NOTE — ED Triage Notes (Signed)
Per Pt, Pt presents with congestion, productive cough, chills, and fever that started yesterday.

## 2016-11-01 NOTE — Discharge Instructions (Signed)
Your chest x-ray shows that you have a pneumonia.  Please take antibiotics as prescribed.  Return for worsening symptoms, including persistent fevers after first 3 days, difficulty breathing, passing out, confusion, or any other symptoms concerning to you.

## 2016-11-01 NOTE — ED Notes (Signed)
Pt now in lobby.  States she did not hear her name being called on multiple occasions, will room.

## 2016-11-04 ENCOUNTER — Encounter (HOSPITAL_COMMUNITY): Payer: Self-pay | Admitting: Emergency Medicine

## 2016-11-04 ENCOUNTER — Ambulatory Visit (HOSPITAL_COMMUNITY)
Admission: EM | Admit: 2016-11-04 | Discharge: 2016-11-04 | Disposition: A | Discharge status: Home / Self Care | Payer: Medicaid Other | Attending: Family Medicine | Admitting: Family Medicine

## 2016-11-04 DIAGNOSIS — R05 Cough: Secondary | ICD-10-CM

## 2016-11-04 DIAGNOSIS — J189 Pneumonia, unspecified organism: Secondary | ICD-10-CM

## 2016-11-04 DIAGNOSIS — J181 Lobar pneumonia, unspecified organism: Secondary | ICD-10-CM

## 2016-11-04 DIAGNOSIS — R059 Cough, unspecified: Secondary | ICD-10-CM

## 2016-11-04 MED ORDER — BENZONATATE 100 MG PO CAPS: 100.0000 mg | ORAL_CAPSULE | Freq: Three times a day (TID) | ORAL | 0 refills | Status: DC | PRN

## 2016-11-04 MED ORDER — ALBUTEROL SULFATE HFA 108 (90 BASE) MCG/ACT IN AERS: 2.0000 | INHALATION_SPRAY | Freq: Four times a day (QID) | RESPIRATORY_TRACT | 0 refills | Status: AC | PRN

## 2016-11-04 MED ORDER — PREDNISONE 20 MG PO TABS: 40.0000 mg | ORAL_TABLET | Freq: Every day | ORAL | 0 refills | Status: AC

## 2016-11-04 NOTE — ED Triage Notes (Signed)
[facility] a [facility]. The patient stated that she was diagnosed with pneumonia 4 days ago at the ED.

## 2016-11-04 NOTE — Discharge Instructions (Signed)
Continue Levaquin (antibiotic) as directed. Start Benzonatate cough pills 1 every 8 hours as needed for cough. Take Prednisone 40mg  daily for 5 days. Increase fluid intake to help thin out mucus. May take Albuterol 2 puffs every 6 hours as needed for wheezing. Follow-up in 4 to 5 days for recheck.

## 2016-11-04 NOTE — ED Provider Notes (Signed)
CSN: [id number]     Arrival date & time 11/04/16  1247 History   First MD Initiated Contact with Patient 11/04/16 1451     Chief Complaint  Patient presents with  . Cough   (Consider location/radiation/quality/duration/timing/severity/associated sxs/prior Treatment) 61 year old female presents with coughing and chest congestion for over 1 week. Was seen at the ER 3 days ago on 11/01/16 and dx with pneumonia. Was placed on Levaquin and told to follow-up here at Urgent Care since she does not have a PCP. She presents with continued cough and congestion with thick mucus. Still feels bad and wheezing more now. Denies any fever or GI symptoms. Has not taken any other medication for cough. Has multiple allergies and on multiple medications for chronic health conditions.    The history is provided by the patient.    Past Medical History:  Diagnosis Date  . Asthma   . Headache   . Hyperlipemia   . Hypertension   . Irregular heart beat   . Migraine   . Restless leg syndrome   . Seizure (HCC)   . Thyroid disease    Past Surgical History:  Procedure Laterality Date  . ABDOMINAL HYSTERECTOMY    . ADENOIDECTOMY    . CARPAL TUNNEL RELEASE    . CESAREAN SECTION    . EYE SURGERY    . TONSILLECTOMY     Family History  Problem Relation Age of Onset  . Heart disease Mother   . Heart disease Father   . Diabetes Brother   . Arthritis Brother    Social History  Substance Use Topics  . Smoking status: Never Smoker  . Smokeless tobacco: Never Used  . Alcohol use No   OB History    No data available     Review of Systems  Constitutional: Positive for chills and fatigue. Negative for fever.  HENT: Positive for congestion and postnasal drip. Negative for ear pain, rhinorrhea, sinus pain, sinus pressure and sore throat.   Respiratory: Positive for cough, chest tightness, shortness of breath and wheezing.   Cardiovascular: Negative for chest pain.  Gastrointestinal: Negative for abdominal  pain, diarrhea, nausea and vomiting.  Musculoskeletal: Negative for back pain, neck pain and neck stiffness.  Skin: Negative for rash.  Neurological: Positive for headaches. Negative for dizziness, syncope, weakness and light-headedness.  Hematological: Negative for adenopathy.    Allergies  Chocolate; Diclofenac; Gabapentin; Ceclor [cefaclor]; Codeine; Penicillins; Sulfa antibiotics; and Zithromax [azithromycin]  Home Medications   Prior to Admission medications   Medication Sig Start Date End Date Taking? Authorizing Provider  albuterol (PROVENTIL HFA;VENTOLIN HFA) 108 (90 Base) MCG/ACT inhaler Inhale 2 puffs into the lungs every 6 (six) hours as needed for wheezing or shortness of breath. 11/04/16   Sudie Grumbling, NP  amLODipine (NORVASC) 10 MG tablet Take 1 tablet (10 mg total) by mouth daily. 12/06/14   Dessa Phi, MD  Aspirin-Salicylamide-Caffeine (BC HEADACHE POWDER PO) Take 2 each by mouth every 6 (six) hours as needed (migraine).    Historical Provider, MD  atorvastatin (LIPITOR) 10 MG tablet Take 1 tablet (10 mg total) by mouth daily. 03/18/14   Ambrose Finland, NP  benzonatate (TESSALON) 100 MG capsule Take 1 capsule (100 mg total) by mouth 3 (three) times daily as needed for cough. 11/04/16   Sudie Grumbling, NP  citalopram (CELEXA) 20 MG tablet Take 1 tablet (20 mg total) by mouth daily. 01/12/14   Richarda Overlie, MD  diclofenac (VOLTAREN) 75 MG EC  tablet Take 75 mg by mouth 2 (two) times daily as needed for mild pain or moderate pain.  11/03/14   Historical Provider, MD  fluticasone (FLONASE) 50 MCG/ACT nasal spray Place 2 sprays into both nostrils daily. 10/13/16   Shawn C Joy, PA-C  LANCETS ULTRA THIN MISC Inject 1 each into the skin 2 (two) times daily.  11/12/14   Historical Provider, MD  levofloxacin (LEVAQUIN) 500 MG tablet Take 1.5 tablets (750 mg total) by mouth daily. 11/01/16   Lavera Guise, MD  levothyroxine (SYNTHROID, LEVOTHROID) 100 MCG tablet Take 1 tablet (100 mcg total)  by mouth daily before breakfast. 12/07/14   Dessa Phi, MD  loratadine (CLARITIN) 10 MG tablet Take 1 tablet (10 mg total) by mouth daily. 03/18/14   Ambrose Finland, NP  losartan-hydrochlorothiazide (HYZAAR) 50-12.5 MG per tablet Take 1 tablet by mouth daily. 12/06/14   Josalyn Funches, MD  meloxicam (MOBIC) 7.5 MG tablet Take 7.5 mg by mouth daily. 09/02/14   Historical Provider, MD  naproxen (NAPROSYN) 500 MG tablet Take 1 tablet (500 mg total) by mouth 2 (two) times daily as needed for moderate pain. 12/06/14   Dessa Phi, MD  nortriptyline (PAMELOR) 10 MG capsule Take 1 capsule (10 mg total) by mouth at bedtime. 03/03/15   Anson Fret, MD  Nystatin Domestic POWD 1 each by Does not apply route 2 (two) times daily. 05/20/14   Quentin Angst, MD  predniSONE (DELTASONE) 20 MG tablet Take 2 tablets (40 mg total) by mouth daily. 11/04/16 11/09/16  Sudie Grumbling, NP   Meds Ordered and Administered this Visit  Medications - No data to display  BP 156/100 (BP Location: Left Arm)   Pulse 84   Temp 98.7 F (37.1 C) (Oral)   Resp 17   SpO2 98%  No data found.   Physical Exam  Constitutional: She is oriented to person, place, and time. She appears well-developed and well-nourished. She appears ill. No distress.  HENT:  Head: Normocephalic and atraumatic.  Right Ear: Hearing, tympanic membrane, external ear and ear canal normal.  Left Ear: Hearing, tympanic membrane, external ear and ear canal normal.  Nose: Mucosal edema and rhinorrhea present. Right sinus exhibits frontal sinus tenderness. Right sinus exhibits no maxillary sinus tenderness. Left sinus exhibits frontal sinus tenderness. Left sinus exhibits no maxillary sinus tenderness.  Mouth/Throat: Uvula is midline, oropharynx is clear and moist and mucous membranes are normal.  Neck: Normal range of motion. Neck supple.  Cardiovascular: Normal rate and regular rhythm.   Pulmonary/Chest: Effort normal. No respiratory distress. She  has decreased breath sounds in the left upper field and the left lower field. She has wheezes in the right upper field and the left upper field. She has rhonchi in the right upper field, the right lower field, the left upper field and the left lower field. She has no rales.  Lymphadenopathy:    She has no cervical adenopathy.  Neurological: She is alert and oriented to person, place, and time.  Skin: Skin is warm and dry. Capillary refill takes less than 2 seconds.  Psychiatric: Her speech is normal. Her mood appears anxious. She is agitated.    Urgent Care Course     Procedures (including critical care time)  Labs Review Labs Reviewed - No data to display  Imaging Review No results found.   Visual Acuity Review  Right Eye Distance:   Left Eye Distance:   Bilateral Distance:    Right Eye Near:  Left Eye Near:    Bilateral Near:         MDM   1. Community acquired pneumonia of left lower lobe of lung (HCC)   2. Cough    Discussed that cough and congestion may take days to weeks to resolve. Recommend continue Levaquin 750mg  daily as directed. Start Benzonatate cough pills 1 every 8 hours as needed for cough. Take Prednisone 40mg  daily for 5 days. Increase fluid intake to help thin out mucus. May take Albuterol 2 puffs every 6 hours as needed for wheezing. Follow-up in 4 to 5 days for recheck or go back to ER if symptoms worsen.     Sudie GrumblingAnn Berry Horris Speros, NP 11/05/16 [id number]

## 2017-01-03 ENCOUNTER — Emergency Department (HOSPITAL_COMMUNITY)
Admission: EM | Admit: 2017-01-03 | Discharge: 2017-01-03 | Disposition: A | Discharge status: Home / Self Care | Payer: Medicaid Other | Attending: Emergency Medicine | Admitting: Emergency Medicine

## 2017-01-03 ENCOUNTER — Encounter (HOSPITAL_COMMUNITY): Payer: Self-pay | Admitting: Emergency Medicine

## 2017-01-03 DIAGNOSIS — M79604 Pain in right leg: Secondary | ICD-10-CM

## 2017-01-03 DIAGNOSIS — Z7982 Long term (current) use of aspirin: Secondary | ICD-10-CM | POA: Insufficient documentation

## 2017-01-03 DIAGNOSIS — M79605 Pain in left leg: Secondary | ICD-10-CM

## 2017-01-03 DIAGNOSIS — M545 Low back pain, unspecified: Secondary | ICD-10-CM

## 2017-01-03 DIAGNOSIS — G8929 Other chronic pain: Secondary | ICD-10-CM | POA: Insufficient documentation

## 2017-01-03 DIAGNOSIS — J449 Chronic obstructive pulmonary disease, unspecified: Secondary | ICD-10-CM | POA: Diagnosis not present

## 2017-01-03 DIAGNOSIS — Z79899 Other long term (current) drug therapy: Secondary | ICD-10-CM | POA: Diagnosis not present

## 2017-01-03 DIAGNOSIS — E039 Hypothyroidism, unspecified: Secondary | ICD-10-CM | POA: Insufficient documentation

## 2017-01-03 DIAGNOSIS — E119 Type 2 diabetes mellitus without complications: Secondary | ICD-10-CM | POA: Insufficient documentation

## 2017-01-03 DIAGNOSIS — R0689 Other abnormalities of breathing: Secondary | ICD-10-CM | POA: Insufficient documentation

## 2017-01-03 DIAGNOSIS — I10 Essential (primary) hypertension: Secondary | ICD-10-CM | POA: Diagnosis not present

## 2017-01-03 HISTORY — DX: Type 2 diabetes mellitus without complications: E11.9

## 2017-01-03 LAB — CBC WITH DIFFERENTIAL/PLATELET
BASOS PCT: 0 %
Basophils Absolute: 0 10*3/uL (ref 0.0–0.1)
EOS ABS: 0.1 10*3/uL (ref 0.0–0.7)
Eosinophils Relative: 1 %
HCT: 44.2 % (ref 36.0–46.0)
Hemoglobin: 14 g/dL (ref 12.0–15.0)
Lymphocytes Relative: 23 %
Lymphs Abs: 2.2 10*3/uL (ref 0.7–4.0)
MCH: 29.8 pg (ref 26.0–34.0)
MCHC: 31.7 g/dL (ref 30.0–36.0)
MCV: 94 fL (ref 78.0–100.0)
Monocytes Absolute: 0.9 10*3/uL (ref 0.1–1.0)
Monocytes Relative: 10 %
Neutro Abs: 6.1 10*3/uL (ref 1.7–7.7)
Neutrophils Relative %: 66 %
Platelets: 233 10*3/uL (ref 150–400)
RBC: 4.7 MIL/uL (ref 3.87–5.11)
RDW: 14.9 % (ref 11.5–15.5)
WBC: 9.3 10*3/uL (ref 4.0–10.5)

## 2017-01-03 LAB — BASIC METABOLIC PANEL
Anion gap: 12 (ref 5–15)
BUN: 16 mg/dL (ref 6–20)
CHLORIDE: 109 mmol/L (ref 101–111)
CO2: 23 mmol/L (ref 22–32)
Calcium: 9.5 mg/dL (ref 8.9–10.3)
Creatinine, Ser: 0.87 mg/dL (ref 0.44–1.00)
GFR calc non Af Amer: >60 mL/min (ref >60)
Glucose, Bld: 133 mg/dL — ABNORMAL HIGH (ref 65–99)
POTASSIUM: 3.4 mmol/L — AB (ref 3.5–5.1)
SODIUM: 144 mmol/L (ref 135–145)

## 2017-01-03 LAB — BRAIN NATRIURETIC PEPTIDE: B NATRIURETIC PEPTIDE 5: 66.9 pg/mL (ref 0.0–100.0)

## 2017-01-03 MED ORDER — METOPROLOL SUCCINATE ER 25 MG PO TB24: 25.0000 mg | ORAL_TABLET | Freq: Every day | ORAL | 0 refills | Status: DC

## 2017-01-03 MED ORDER — HYDROCODONE-ACETAMINOPHEN 5-325 MG PO TABS
1.0000 | ORAL_TABLET | Freq: Once | ORAL | Status: AC
  Administered 2017-01-03: 1 via ORAL
  Filled 2017-01-03: qty 1

## 2017-01-03 MED ORDER — METHOCARBAMOL 500 MG PO TABS
500.0000 mg | ORAL_TABLET | Freq: Once | ORAL | Status: AC
  Administered 2017-01-03: 500 mg via ORAL
  Filled 2017-01-03: qty 1

## 2017-01-03 MED ORDER — METHOCARBAMOL 500 MG PO TABS: 500.0000 mg | ORAL_TABLET | Freq: Three times a day (TID) | ORAL | 0 refills | Status: DC | PRN

## 2017-01-03 MED ORDER — MELOXICAM 7.5 MG PO TABS: 7.5000 mg | ORAL_TABLET | Freq: Every day | ORAL | 0 refills | Status: DC

## 2017-01-03 NOTE — ED Notes (Signed)
Pt c/o chronic LBP and lower leg swelling. States she started a new job this week which she thinks made her legs swell.

## 2017-01-03 NOTE — ED Notes (Signed)
Social work is faxing resources w/ attn: Production designeEducation officer, museumr, theatre/television/filmJason Mesner

## 2017-01-03 NOTE — ED Triage Notes (Signed)
Pt has chronic back and leg pain. Pt states it has been bothering her for a year. Pt ambulatory but is in a lot of pain. "I just don't think I can keep working, because my back hurts so bad."

## 2017-01-03 NOTE — ED Provider Notes (Signed)
[facility]-EMERGENCY DEPT Provider Note   CSN: [id number] Arrival date & time: 01/03/17  1652   By signing my name below, I, Clarisse Gouge, attest that this documentation has been prepared under the direction and in the presence of Marily Memos, MD. Electronically signed, Clarisse Gouge, ED Scribe. 01/03/17. 6:21 PM.   History   Chief Complaint Chief Complaint  Patient presents with  . Back Pain  . Leg Pain   The history is provided by the patient and medical records. No language interpreter was used.    HPI Comments: Sandra Patrick is a 61 y.o. female with Hx of arthritis, L hand weakness, bilateral low back pain and RLS who presents to the Emergency Department complaining of worsening, episodic, chronic back pain x > 1 year. She adds associated bilateral lower extremity pain, 1 episode of palpitations today, and swelling. 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 She states Dr. Loleta Chance of neurology at [facility] "told [her] that her back pain is connected with her nerves" because of her Diabetes. She adds she has Pt is ambulatory with no PMHx of sciatica noted. She notes homelessness. Pt denies abdominal pain and chest pain.  Past Medical History:  Diagnosis Date  . Asthma   . Diabetes mellitus without complication (HCC)   . Headache   . Hyperlipemia   . Hypertension   . Irregular heart beat   . Migraine   . Restless leg syndrome   . Seizure (HCC)   . Thyroid disease     Patient Active Problem List   Diagnosis Date Noted  . Snoring 03/03/2015  . Excessive daytime sleepiness 03/03/2015  . Nocturnal headaches 03/03/2015  . RLS (restless legs syndrome) 03/03/2015  . Vitamin D deficiency 12/07/2014  . 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 Chronic right-sided headache 12/06/2014  . Bilateral low back pain without sciatica 05/20/2014  . Left hand weakness 12/25/2013  . Near syncope  06/25/2013  . Hypothyroidism 05/28/2013  . Arthritis 05/28/2013  . Anxiety state, unspecified 03/13/2013  . HTN (hypertension) 03/13/2013    Past Surgical History:  Procedure Laterality Date  . ABDOMINAL HYSTERECTOMY    . ADENOIDECTOMY    . CARPAL TUNNEL RELEASE    . CESAREAN SECTION    . EYE SURGERY    . TONSILLECTOMY      OB History    No data available       Home Medications    Prior to Admission medications   Medication Sig Start Date End Date Taking? Authorizing Provider  albuterol (PROVENTIL HFA;VENTOLIN HFA) 108 (90 Base) MCG/ACT inhaler Inhale 2 puffs into the lungs every 6 (six) hours as needed for wheezing or shortness of breath. 11/04/16   Sudie Grumbling, NP  amLODipine (NORVASC) 10 MG tablet Take 1 tablet (10 mg total) by mouth daily. 12/06/14   Dessa Phi, MD  Aspirin-Salicylamide-Caffeine (BC HEADACHE POWDER PO) Take 2 each by mouth every 6 (six) hours as needed (migraine).    Historical Provider, MD  atorvastatin (LIPITOR) 10 MG tablet Take 1 tablet (10 mg total) by mouth daily. 03/18/14   Ambrose Finland, NP  benzonatate (TESSALON) 100 MG capsule Take 1 capsule (100 mg total) by mouth 3 (three) times daily as needed for cough. 11/04/16   Sudie Grumbling, NP  citalopram (CELEXA) 20 MG tablet Take 1 tablet (20 mg total) by mouth daily. 01/12/14  Richarda OverlieNayana Abrol, MD  fluticasone (FLONASE) 50 MCG/ACT nasal spray Place 2 sprays into both nostrils daily. 10/13/16   Shawn C Joy, PA-C  LANCETS ULTRA THIN MISC Inject 1 each into the skin 2 (two) times daily.  11/12/14   Historical Provider, MD  levothyroxine (SYNTHROID, LEVOTHROID) 100 MCG tablet Take 1 tablet (100 mcg total) by mouth daily before breakfast. 12/07/14   Dessa PhiJosalyn Funches, MD  loratadine (CLARITIN) 10 MG tablet Take 1 tablet (10 mg total) by mouth daily. 03/18/14   Ambrose FinlandValerie A Keck, NP  losartan-hydrochlorothiazide (HYZAAR) 50-12.5 MG per tablet Take 1 tablet by mouth daily. 12/06/14   Josalyn Funches, MD  meloxicam  (MOBIC) 7.5 MG tablet Take 1 tablet (7.5 mg total) by mouth daily. 01/03/17   Marily MemosJason Joash Tony, MD  methocarbamol (ROBAXIN) 500 MG tablet Take 1 tablet (500 mg total) by mouth every 8 (eight) hours as needed for muscle spasms. 01/03/17   Marily MemosJason Dakwon Wenberg, MD  metoprolol succinate (TOPROL-XL) 25 MG 24 hr tablet Take 1 tablet (25 mg total) by mouth daily. 01/03/17   Marily MemosJason Dontrell Stuck, MD  nortriptyline (PAMELOR) 10 MG capsule Take 1 capsule (10 mg total) by mouth at bedtime. 03/03/15   Anson FretAntonia B Ahern, MD  Nystatin Domestic POWD 1 each by Does not apply route 2 (two) times daily. 05/20/14   Quentin Angstlugbemiga E Jegede, MD    Family History Family History  Problem Relation Age of Onset  . Heart disease Mother   . Heart disease Father   . Diabetes Brother   . Arthritis Brother     Social History Social History  Substance Use Topics  . Smoking status: Never Smoker  . Smokeless tobacco: Never Used  . Alcohol use No     Allergies   Chocolate; Diclofenac; Gabapentin; Ceclor [cefaclor]; Codeine; Penicillins; Sulfa antibiotics; and Zithromax [azithromycin]   Review of Systems Review of Systems  Cardiovascular: Positive for palpitations and leg swelling. Negative for chest pain.  Gastrointestinal: Negative for abdominal pain.  Musculoskeletal: Positive for back pain and myalgias. Negative for joint swelling, neck pain and neck stiffness.  Skin: Negative for wound.       +dry skin  Neurological: Negative for weakness and numbness.  Psychiatric/Behavioral: Positive for agitation. The patient is nervous/anxious.   All other systems reviewed and are negative.    Physical Exam Updated Vital Signs BP 129/83 (BP Location: Right Arm)   Pulse 96   Temp 98.1 F (36.7 C) (Oral)   Resp 15   Ht 5\' 2"  (1.575 m)   SpO2 98%   Physical Exam  Constitutional: She is oriented to person, place, and time. She appears well-developed and well-nourished.  HENT:  Head: Normocephalic and atraumatic.  Eyes: Conjunctivae are  normal. Pupils are equal, round, and reactive to light. Right eye exhibits no discharge. Left eye exhibits no discharge. No scleral icterus.  Neck: Normal range of motion. No JVD present. No tracheal deviation present.  Pulmonary/Chest: Effort normal. No stridor.  Abdominal: Soft. There is no tenderness.  Musculoskeletal: Normal range of motion. She exhibits edema and tenderness. She exhibits no deformity.  Mild trace edema to bilateral lower extremities; R sided gluteal pain  Neurological: She is alert and oriented to person, place, and time. Coordination normal.  Skin: Skin is warm and dry.  Dry skin to bilateral lower extremities  Psychiatric: She has a normal mood and affect. Her behavior is normal. Judgment and thought content normal.  Nursing note and vitals reviewed.    ED Treatments / Results  DIAGNOSTIC STUDIES: Oxygen Saturation is 98% on RA, normal by my interpretation.    COORDINATION OF CARE: 6:17 PM Discussed treatment plan with pt at bedside and pt agreed to plan. Will order medication and blood work.  Labs (all labs ordered are listed, but only abnormal results are displayed) Labs Reviewed  BASIC METABOLIC PANEL - Abnormal; Notable for the following:       Result Value   Potassium 3.4 (*)    Glucose, Bld 133 (*)    All other components within normal limits  CBC WITH DIFFERENTIAL/PLATELET  BRAIN NATRIURETIC PEPTIDE    EKG  EKG Interpretation  Date/Time:  Thursday January 03 2017 17:47:04 EDT Ventricular Rate:  77 PR Interval:  158 QRS Duration: 78 QT Interval:  372 QTC Calculation: 420 R Axis:   80 Text Interpretation:  Normal sinus rhythm Nonspecific T wave abnormality Abnormal ECG No significant change since last tracing Confirmed by [facility] MD, Barbara Cower [phone]) on 01/03/2017 7:53:09 PM Also confirmed by [facility] MD, Alias Villagran [phone]), editor [place]  CCT, [place] ([id number])  on 01/04/2017 8:13:49 AM       Radiology No results found.  Procedures Procedures  (including critical care time)  Medications Ordered in ED Medications  HYDROcodone-acetaminophen (NORCO/VICODIN) 5-325 MG per tablet 1 tablet (1 tablet Oral Given 01/03/17 1830)  methocarbamol (ROBAXIN) tablet 500 mg (500 mg Oral Given 01/03/17 1830)     Initial Impression / Assessment and Plan / ED Course  I have reviewed the triage vital signs and the nursing notes.  Pertinent labs & imaging results that were available during my care of the patient were reviewed by me and considered in my medical decision making (see chart for details).     Multiple nonspecific complaints but it seems like the patient is here for worsening leg swelling and back pain after starting a new job where she is on her feet all day. States she's trying to get disability. Her workup here is negative. She is able to ambulate.  Doubt cauda equina, CHF, DVT, or emergent abdominal pathology as causes for her symptoms. Social work involved to try to get her shelter but otherwise no acute disease this time. She will work on getting follow up with primary doctor. A couple medication refills given as per the patient's request.  Final Clinical Impressions(s) / ED Diagnoses   Final diagnoses:  Right leg pain  Left leg pain  Chronic bilateral low back pain without sciatica    New Prescriptions Discharge Medication List as of 01/03/2017  8:56 PM    START taking these medications   Details  methocarbamol (ROBAXIN) 500 MG tablet Take 1 tablet (500 mg total) by mouth every 8 (eight) hours as needed for muscle spasms., Starting Thu 01/03/2017, Print    metoprolol succinate (TOPROL-XL) 25 MG 24 hr tablet Take 1 tablet (25 mg total) by mouth daily., Starting Thu 01/03/2017, Print       I personally performed the services described in this documentation, which was scribed in my presence. The recorded information has been reviewed and is accurate.     Marily Memos, MD 01/04/17 [phone]

## 2017-01-16 ENCOUNTER — Emergency Department (HOSPITAL_COMMUNITY): Payer: Medicaid Other

## 2017-01-16 ENCOUNTER — Encounter (HOSPITAL_COMMUNITY): Payer: Self-pay | Admitting: Emergency Medicine

## 2017-01-16 DIAGNOSIS — E119 Type 2 diabetes mellitus without complications: Secondary | ICD-10-CM | POA: Insufficient documentation

## 2017-01-16 DIAGNOSIS — Z79899 Other long term (current) drug therapy: Secondary | ICD-10-CM | POA: Insufficient documentation

## 2017-01-16 DIAGNOSIS — J45909 Unspecified asthma, uncomplicated: Secondary | ICD-10-CM | POA: Insufficient documentation

## 2017-01-16 DIAGNOSIS — M25571 Pain in right ankle and joints of right foot: Secondary | ICD-10-CM | POA: Diagnosis present

## 2017-01-16 DIAGNOSIS — E039 Hypothyroidism, unspecified: Secondary | ICD-10-CM | POA: Insufficient documentation

## 2017-01-16 DIAGNOSIS — R600 Localized edema: Secondary | ICD-10-CM | POA: Insufficient documentation

## 2017-01-16 DIAGNOSIS — Z7982 Long term (current) use of aspirin: Secondary | ICD-10-CM | POA: Insufficient documentation

## 2017-01-16 DIAGNOSIS — I10 Essential (primary) hypertension: Secondary | ICD-10-CM | POA: Diagnosis not present

## 2017-01-16 LAB — I-STAT TROPONIN, ED: Troponin i, poc: 0 ng/mL (ref 0.00–0.08)

## 2017-01-16 LAB — CBC
HCT: 40.8 % (ref 36.0–46.0)
Hemoglobin: 12.9 g/dL (ref 12.0–15.0)
MCH: 29.5 pg (ref 26.0–34.0)
MCHC: 31.6 g/dL (ref 30.0–36.0)
MCV: 93.4 fL (ref 78.0–100.0)
PLATELETS: 206 10*3/uL (ref 150–400)
RBC: 4.37 MIL/uL (ref 3.87–5.11)
RDW: 14.8 % (ref 11.5–15.5)
WBC: 7.6 10*3/uL (ref 4.0–10.5)

## 2017-01-16 LAB — BASIC METABOLIC PANEL
Anion gap: 8 (ref 5–15)
BUN: 16 mg/dL (ref 6–20)
CO2: 22 mmol/L (ref 22–32)
CREATININE: 0.78 mg/dL (ref 0.44–1.00)
Calcium: 8.9 mg/dL (ref 8.9–10.3)
Chloride: 110 mmol/L (ref 101–111)
Glucose, Bld: 180 mg/dL — ABNORMAL HIGH (ref 65–99)
POTASSIUM: 3.1 mmol/L — AB (ref 3.5–5.1)
Sodium: 140 mmol/L (ref 135–145)

## 2017-01-16 NOTE — ED Triage Notes (Addendum)
C/o swelling to bilateral ankles x 2 weeks- no known injury.  States L is worse than R.  Denies SOB. Denies pain.

## 2017-01-17 ENCOUNTER — Emergency Department (HOSPITAL_COMMUNITY)
Admission: EM | Admit: 2017-01-17 | Discharge: 2017-01-17 | Disposition: A | Discharge status: Home / Self Care | Payer: Medicaid Other | Attending: Emergency Medicine | Admitting: Emergency Medicine

## 2017-01-17 ENCOUNTER — Ambulatory Visit (HOSPITAL_COMMUNITY): Admission: RE | Admit: 2017-01-17 | Payer: Medicaid Other | Source: Ambulatory Visit

## 2017-01-17 DIAGNOSIS — I10 Essential (primary) hypertension: Secondary | ICD-10-CM

## 2017-01-17 DIAGNOSIS — R609 Edema, unspecified: Secondary | ICD-10-CM

## 2017-01-17 LAB — BRAIN NATRIURETIC PEPTIDE: B Natriuretic Peptide: 173.7 pg/mL — ABNORMAL HIGH (ref 0.0–100.0)

## 2017-01-17 LAB — TROPONIN I

## 2017-01-17 LAB — D-DIMER, QUANTITATIVE (NOT AT ARMC): D DIMER QUANT: 0.54 ug{FEU}/mL — AB (ref 0.00–0.50)

## 2017-01-17 MED ORDER — METOPROLOL SUCCINATE ER 25 MG PO TB24
25.0000 mg | ORAL_TABLET | Freq: Every day | ORAL | Status: DC
  Administered 2017-01-17: 25 mg via ORAL
  Filled 2017-01-17: qty 1

## 2017-01-17 MED ORDER — METOPROLOL SUCCINATE ER 25 MG PO TB24: 25.0000 mg | ORAL_TABLET | Freq: Every day | ORAL | 0 refills | Status: DC

## 2017-01-17 MED ORDER — AMLODIPINE BESYLATE 5 MG PO TABS
10.0000 mg | ORAL_TABLET | Freq: Every day | ORAL | Status: DC
  Administered 2017-01-17: 5 mg via ORAL
  Filled 2017-01-17: qty 2

## 2017-01-17 MED ORDER — FUROSEMIDE 20 MG PO TABS
40.0000 mg | ORAL_TABLET | Freq: Once | ORAL | Status: AC
  Administered 2017-01-17: 40 mg via ORAL
  Filled 2017-01-17: qty 2

## 2017-01-17 MED ORDER — FUROSEMIDE 20 MG PO TABS: 20.0000 mg | ORAL_TABLET | Freq: Every day | ORAL | 0 refills | Status: DC

## 2017-01-17 MED ORDER — AMLODIPINE BESYLATE 10 MG PO TABS: 10.0000 mg | ORAL_TABLET | Freq: Every day | ORAL | 0 refills | Status: DC

## 2017-01-17 NOTE — Discharge Instructions (Signed)
Take your blood pressure medications as prescribed. Return tomorrow for an ultrasound of your leg. Follow up with your doctor. Return to the ED if you develop new or worsening symptoms.

## 2017-01-17 NOTE — ED Provider Notes (Signed)
[facility]-EMERGENCY DEPT Provider Note   CSN: [id number] Arrival date & time: 01/16/17  2048  By signing my name below, I, [name], attest that this documentation has been prepared under the direction and in the presence of [name], MD . Electronically Signed: [name], Scribe. 01/17/2017. 1:50 AM.  History   Chief Complaint Chief Complaint  Patient presents with  . ankles swollen     The history is provided by the patient. No language interpreter was used.     HPI Comments:  Sandra Patrick is a 61 y.o. female with a history of DM, diabetic neuropathy, RLS, and HTN, who presents to the Emergency Department complaining of swelling to the bilateral ankles intermittently x 2 weeks. She reports associated pain to the site.  Pt notes LLE>RLE. She denies use of blood thinners. Pt also denies back pain, abdominal pain, LE numbness/weakness, CP and SOB. No calf pain. No h/o DVT/PE. No alleviating factors noted. Pt notes she has been out of her BP meds for a few months.   Past Medical History:  Diagnosis Date  . Asthma   . Diabetes mellitus without complication (HCC)   . Headache   . Hyperlipemia   . Hypertension   . Irregular heart beat   . Migraine   . Restless leg syndrome   . Seizure (HCC)   . Thyroid disease     Patient Active Problem List   Diagnosis Date Noted  . Snoring 03/03/2015  . Excessive daytime sleepiness 03/03/2015  . Nocturnal headaches 03/03/2015  . RLS (restless legs syndrome) 03/03/2015  . Vitamin D deficiency 12/07/2014  . 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 Chronic right-sided headache 12/06/2014  . Bilateral low back pain without sciatica 05/20/2014  . Left hand weakness 12/25/2013  . Near syncope 06/25/2013  . Hypothyroidism 05/28/2013  . Arthritis 05/28/2013  . Anxiety state, unspecified 03/13/2013  . HTN (hypertension) 03/13/2013    Past Surgical History:  Procedure Laterality Date  . ABDOMINAL HYSTERECTOMY    .  ADENOIDECTOMY    . CARPAL TUNNEL RELEASE    . CESAREAN SECTION    . EYE SURGERY    . TONSILLECTOMY      OB History    No data available       Home Medications    Prior to Admission medications   Medication Sig Start Date End Date Taking? Authorizing Provider  albuterol (PROVENTIL HFA;VENTOLIN HFA) 108 (90 Base) MCG/ACT inhaler Inhale 2 puffs into the lungs every 6 (six) hours as needed for wheezing or shortness of breath. 11/04/16   Sudie Grumbling, NP  amLODipine (NORVASC) 10 MG tablet Take 1 tablet (10 mg total) by mouth daily. 12/06/14   Dessa Phi, MD  Aspirin-Salicylamide-Caffeine (BC HEADACHE POWDER PO) Take 2 each by mouth every 6 (six) hours as needed (migraine).    Historical Provider, MD  atorvastatin (LIPITOR) 10 MG tablet Take 1 tablet (10 mg total) by mouth daily. 03/18/14   Ambrose Finland, NP  benzonatate (TESSALON) 100 MG capsule Take 1 capsule (100 mg total) by mouth 3 (three) times daily as needed for cough. 11/04/16   Sudie Grumbling, NP  citalopram (CELEXA) 20 MG tablet Take 1 tablet (20 mg total) by mouth daily. 01/12/14   Richarda Overlie, MD  fluticasone (FLONASE) 50 MCG/ACT nasal spray Place 2 sprays into both nostrils daily. 10/13/16   Shawn C Joy, PA-C  LANCETS ULTRA THIN MISC Inject 1 each into the skin 2 (two) times  daily.  11/12/14   Historical Provider, MD  levothyroxine (SYNTHROID, LEVOTHROID) 100 MCG tablet Take 1 tablet (100 mcg total) by mouth daily before breakfast. 12/07/14   Dessa Phi, MD  loratadine (CLARITIN) 10 MG tablet Take 1 tablet (10 mg total) by mouth daily. 03/18/14   Ambrose Finland, NP  losartan-hydrochlorothiazide (HYZAAR) 50-12.5 MG per tablet Take 1 tablet by mouth daily. 12/06/14   Josalyn Funches, MD  meloxicam (MOBIC) 7.5 MG tablet Take 1 tablet (7.5 mg total) by mouth daily. 01/03/17   Marily Memos, MD  methocarbamol (ROBAXIN) 500 MG tablet Take 1 tablet (500 mg total) by mouth every 8 (eight) hours as needed for muscle spasms. 01/03/17    Marily Memos, MD  metoprolol succinate (TOPROL-XL) 25 MG 24 hr tablet Take 1 tablet (25 mg total) by mouth daily. 01/03/17   Marily Memos, MD  nortriptyline (PAMELOR) 10 MG capsule Take 1 capsule (10 mg total) by mouth at bedtime. 03/03/15   Anson Fret, MD  Nystatin Domestic POWD 1 each by Does not apply route 2 (two) times daily. 05/20/14   Quentin Angst, MD    Family History Family History  Problem Relation Age of Onset  . Heart disease Mother   . Heart disease Father   . Diabetes Brother   . Arthritis Brother     Social History Social History  Substance Use Topics  . Smoking status: Never Smoker  . Smokeless tobacco: Never Used  . Alcohol use No     Allergies   Chocolate; Diclofenac; Gabapentin; Ceclor [cefaclor]; Codeine; Penicillins; Sulfa antibiotics; and Zithromax [azithromycin]   Review of Systems Review of Systems  All systems reviewed and are negative for acute change except as noted in the HPI.   Physical Exam Updated Vital Signs BP (!) 150/129 (BP Location: Right Arm)   Pulse 84   Temp 98.6 F (37 C) (Oral)   Resp 16   Ht  (1.575 m)   Wt 178 lb (80.7 kg)   SpO2 99%   BMI 32.56 kg/m   Physical Exam  Constitutional: She is oriented to person, place, and time. She appears well-developed and well-nourished. No distress.  HENT:  Head: Normocephalic and atraumatic.  Mouth/Throat: Oropharynx is clear and moist. No oropharyngeal exudate.  Eyes: Conjunctivae and EOM are normal. Pupils are equal, round, and reactive to light.  Neck: Normal range of motion. Neck supple.  No meningismus.  Cardiovascular: Normal rate, regular rhythm, normal heart sounds and intact distal pulses.   No murmur heard. Pulmonary/Chest: Effort normal and breath sounds normal. No respiratory distress.  Abdominal: Soft. There is no tenderness. There is no rebound and no guarding.  Musculoskeletal: Normal range of motion. She exhibits edema. She exhibits no tenderness.    Pedal edema bilaterally trace L>R  intact DP pulse Compartments soft; no calf tenderness   Neurological: She is alert and oriented to person, place, and time. No cranial nerve deficit. She exhibits normal muscle tone. Coordination normal.  No ataxia on finger to nose bilaterally. No pronator drift. 5/5 strength throughout. CN 2-12 intact.Equal grip strength. Sensation intact.   Skin: Skin is warm. There is erythema.  Scaling erythema to left lower ankle  Psychiatric: Her behavior is normal.  Flat affect   Nursing note and vitals reviewed.    ED Treatments / Results  DIAGNOSTIC STUDIES:  Oxygen Saturation is 99% on RA, normal by my interpretation.    COORDINATION OF CARE:  1:50 AM Discussed treatment plan with pt  at bedside and pt agreed to plan.  Labs (all labs ordered are listed, but only abnormal results are displayed) Labs Reviewed  BASIC METABOLIC PANEL - Abnormal; Notable for the following:       Result Value   Potassium 3.1 (*)    Glucose, Bld 180 (*)    All other components within normal limits  D-DIMER, QUANTITATIVE (NOT AT [facility]) - Abnormal; Notable for the following:    D-Dimer, Quant 0.54 (*)    All other components within normal limits  BRAIN NATRIURETIC PEPTIDE - Abnormal; Notable for the following:    B Natriuretic Peptide 173.7 (*)    All other components within normal limits  CBC  TROPONIN I  I-STAT TROPOININ, ED    EKG  EKG Interpretation  Date/Time:  Wednesday January 16 2017 21:15:33 EDT Ventricular Rate:  67 PR Interval:  158 QRS Duration: 82 QT Interval:  342 QTC Calculation: 361 R Axis:   90 Text Interpretation:  Normal sinus rhythm Rightward axis Nonspecific T wave abnormality Abnormal ECG No significant change was found Confirmed by Manus Gunning  MD, Kaleeya Hancock [phone]) on 01/17/2017 1:36:07 AM       Radiology Dg Chest 2 View  Result Date: 01/16/2017 CLINICAL DATA:  61 y/o F; ankle swelling. History of asthma and hypertension. EXAM: CHEST  2  VIEW COMPARISON:  11/01/2016 chest radiograph FINDINGS: Stable heart size and mediastinal contours are within normal limits. Both lungs are clear. Mild degenerative changes of the thoracic spine. IMPRESSION: No active cardiopulmonary disease. Electronically Signed   By: Mitzi Hansen M.D.   On: 01/16/2017 22:00    Procedures Procedures (including critical care time)  Medications Ordered in ED Medications - No data to display   Initial Impression / Assessment and Plan / ED Course  I have reviewed the triage vital signs and the nursing notes.  Pertinent labs & imaging results that were available during my care of the patient were reviewed by me and considered in my medical decision making (see chart for details).     2 weeks of bilateral ankle swelling, left greater than right. No trauma. No chest pain or shortness of breath. Has not had blood pressure medication for a month.  Neurovascularly intact.   No calf tenderness.  Blood Pressure elevated. Home blood pressure medications given. She denies chest pain or shortness of breath. Labs reassuring. D-dimer minimally elevated. We'll arrange for Doppler study tomorrow. Doubt pulmonary embolism with absence of chest pain or shortness of breath. No tachycardia or hypoxia. No evidence of CHF.    Patient presents with asymmetric lower extremity edema. Doppler will be ordered tomorrow. Short course of lasix given for peripheral edema. Return precautions discussed. Final Clinical Impressions(s) / ED Diagnoses   Final diagnoses:  Peripheral edema    New Prescriptions New Prescriptions   No medications on file   I personally performed the services described in this documentation, which was scribed in my presence. The recorded information has been reviewed and is accurate.     [name], MD 01/17/17 [phone]

## 2018-04-11 IMAGING — CR DG CHEST 2V
2 series · 2 of 2 positions shown · non-contrast
Comparison: PA and lateral chest x-ray dated October 25, 2014

CLINICAL DATA: One day of cough.  History of asthma.  Nonsmoker.

EXAM:
CHEST  2 VIEW

[chest pa]
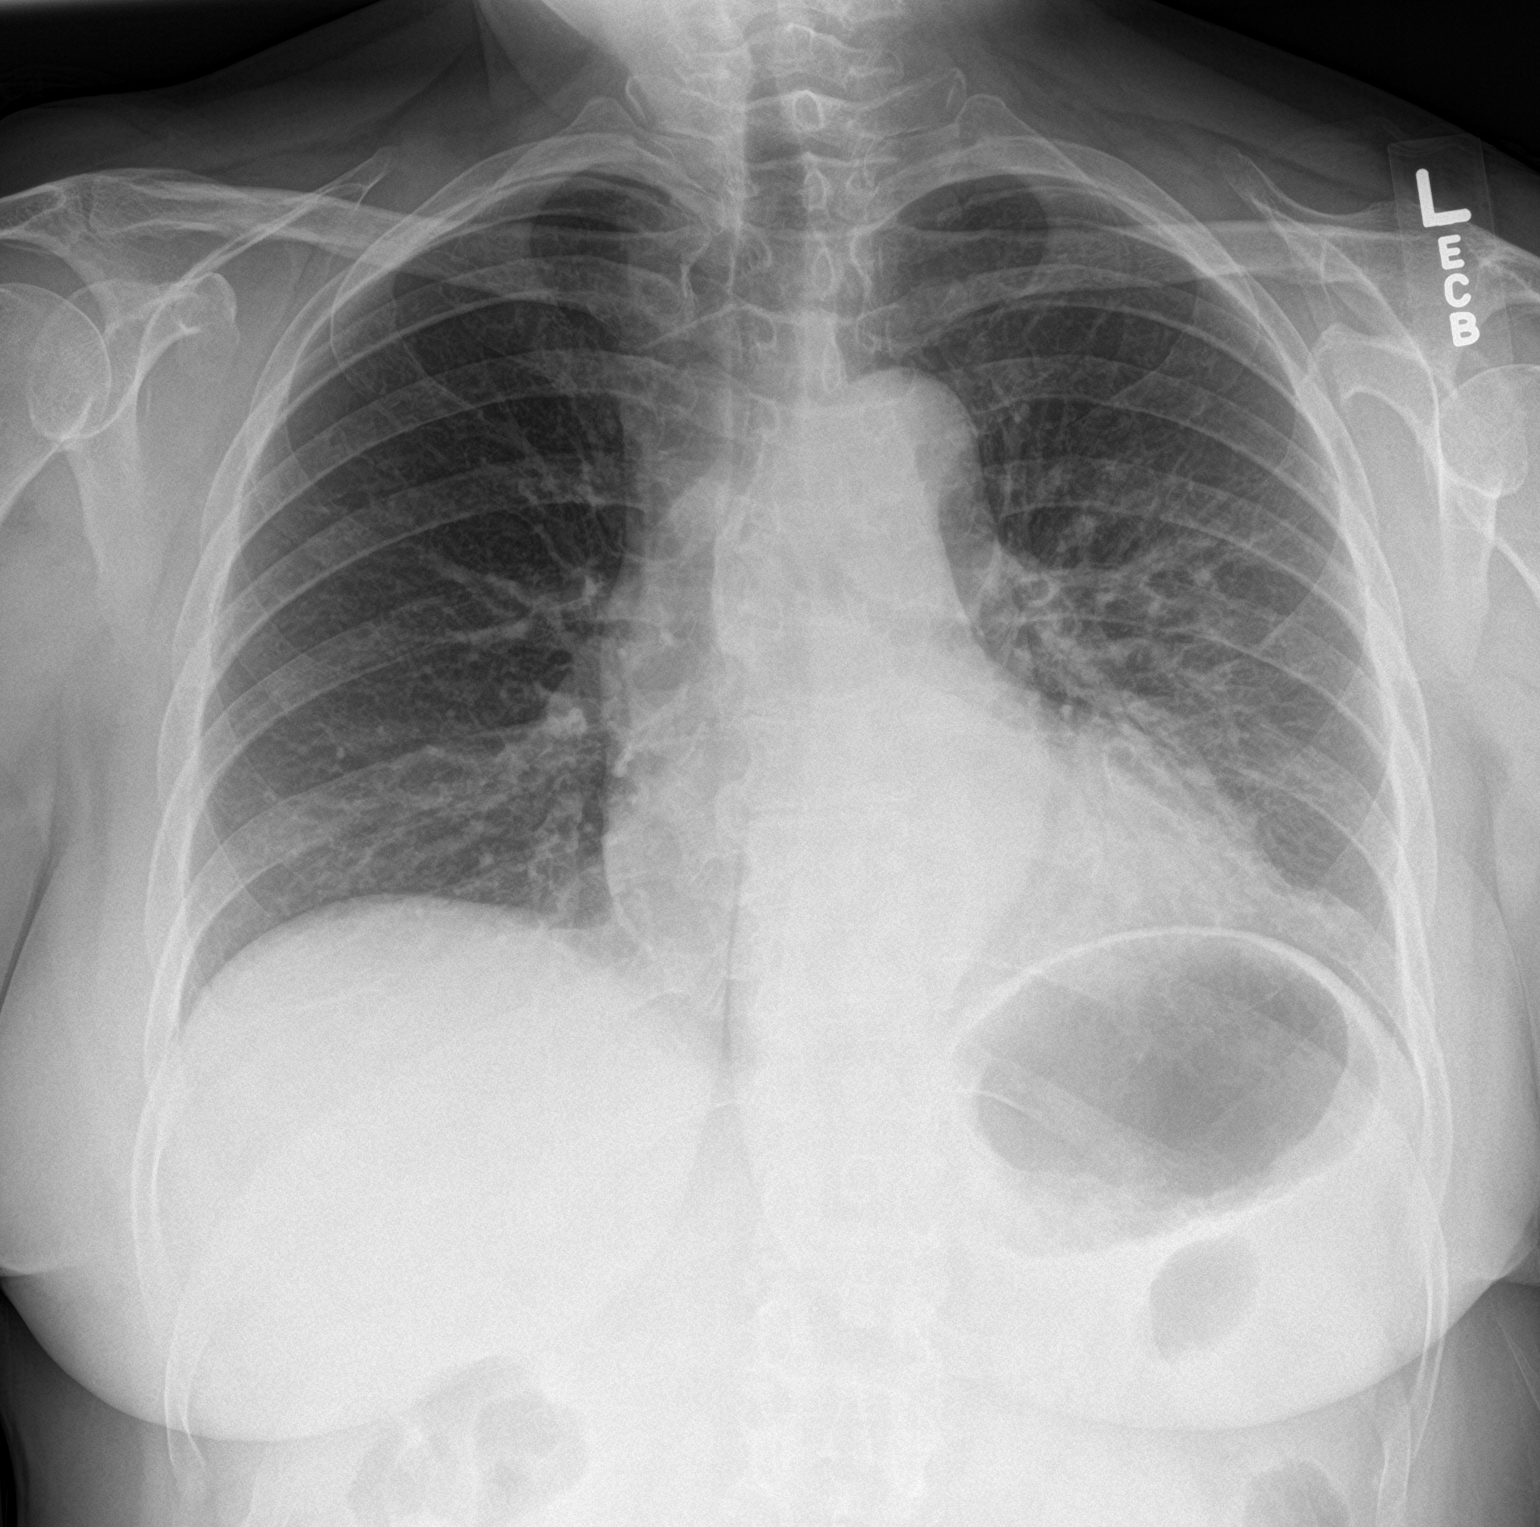

[chest lat]
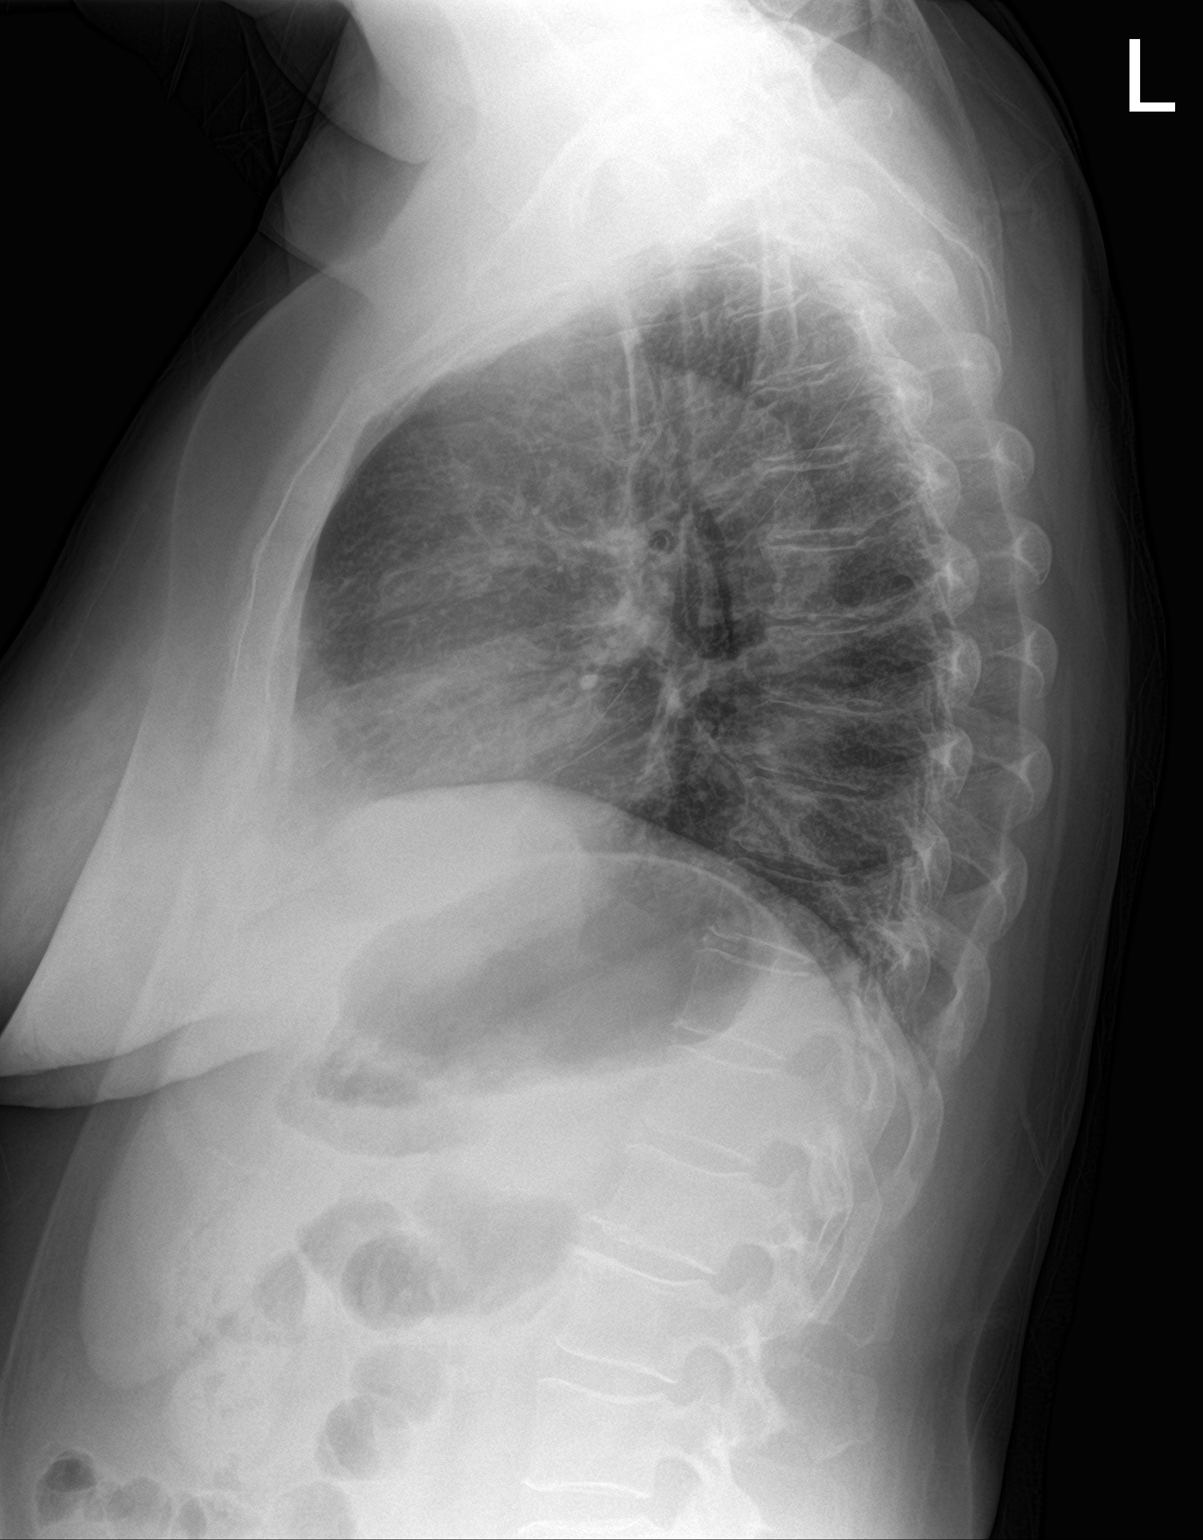

[2 of 2 positions shown; findings below may reference images not displayed]

FINDINGS: The lungs are borderline hypoinflated. There is abnormal
interstitial density in the left mid and lower lung which is new.
There is no pleural effusion. The heart and pulmonary vascularity
are normal. There is tortuosity of the descending thoracic aorta.
The trachea is midline. There is mild multilevel degenerative disc
disease of the thoracic spine.
IMPRESSION: New interstitial pneumonia in the left mid and lower lung. Followup
PA and lateral chest X-ray is recommended in 3-4 weeks following
trial of antibiotic therapy to ensure resolution.

## 2023-11-29 ENCOUNTER — Emergency Department (HOSPITAL_COMMUNITY): Payer: Medicare HMO

## 2023-11-29 ENCOUNTER — Encounter (HOSPITAL_COMMUNITY): Payer: Self-pay | Admitting: *Deleted

## 2023-11-29 ENCOUNTER — Inpatient Hospital Stay (HOSPITAL_COMMUNITY)
Admission: EM | Admit: 2023-11-29 | Discharge: 2023-12-04 | DRG: 065 | Disposition: A | Discharge status: Home Health | Payer: Medicare HMO | Attending: Family Medicine | Admitting: Family Medicine

## 2023-11-29 ENCOUNTER — Other Ambulatory Visit: Payer: Self-pay

## 2023-11-29 DIAGNOSIS — I10 Essential (primary) hypertension: Secondary | ICD-10-CM | POA: Diagnosis present

## 2023-11-29 DIAGNOSIS — F411 Generalized anxiety disorder: Secondary | ICD-10-CM | POA: Diagnosis not present

## 2023-11-29 DIAGNOSIS — Z6830 Body mass index (BMI) 30.0-30.9, adult: Secondary | ICD-10-CM

## 2023-11-29 DIAGNOSIS — E669 Obesity, unspecified: Secondary | ICD-10-CM | POA: Insufficient documentation

## 2023-11-29 DIAGNOSIS — H50112 Monocular exotropia, left eye: Secondary | ICD-10-CM | POA: Diagnosis present

## 2023-11-29 DIAGNOSIS — E876 Hypokalemia: Secondary | ICD-10-CM | POA: Diagnosis present

## 2023-11-29 DIAGNOSIS — E66811 Obesity, class 1: Secondary | ICD-10-CM | POA: Diagnosis present

## 2023-11-29 DIAGNOSIS — N390 Urinary tract infection, site not specified: Secondary | ICD-10-CM | POA: Diagnosis present

## 2023-11-29 DIAGNOSIS — R1310 Dysphagia, unspecified: Secondary | ICD-10-CM

## 2023-11-29 DIAGNOSIS — Z79899 Other long term (current) drug therapy: Secondary | ICD-10-CM | POA: Diagnosis not present

## 2023-11-29 DIAGNOSIS — F419 Anxiety disorder, unspecified: Secondary | ICD-10-CM | POA: Diagnosis present

## 2023-11-29 DIAGNOSIS — Z833 Family history of diabetes mellitus: Secondary | ICD-10-CM | POA: Diagnosis not present

## 2023-11-29 DIAGNOSIS — F32A Depression, unspecified: Secondary | ICD-10-CM | POA: Diagnosis present

## 2023-11-29 DIAGNOSIS — G40909 Epilepsy, unspecified, not intractable, without status epilepticus: Secondary | ICD-10-CM | POA: Diagnosis present

## 2023-11-29 DIAGNOSIS — Z880 Allergy status to penicillin: Secondary | ICD-10-CM

## 2023-11-29 DIAGNOSIS — I69391 Dysphagia following cerebral infarction: Secondary | ICD-10-CM | POA: Diagnosis not present

## 2023-11-29 DIAGNOSIS — R29810 Facial weakness: Secondary | ICD-10-CM | POA: Diagnosis present

## 2023-11-29 DIAGNOSIS — I471 Supraventricular tachycardia, unspecified: Secondary | ICD-10-CM | POA: Diagnosis present

## 2023-11-29 DIAGNOSIS — Z885 Allergy status to narcotic agent status: Secondary | ICD-10-CM

## 2023-11-29 DIAGNOSIS — Z8249 Family history of ischemic heart disease and other diseases of the circulatory system: Secondary | ICD-10-CM | POA: Diagnosis not present

## 2023-11-29 DIAGNOSIS — E1165 Type 2 diabetes mellitus with hyperglycemia: Secondary | ICD-10-CM | POA: Diagnosis present

## 2023-11-29 DIAGNOSIS — Z7985 Long-term (current) use of injectable non-insulin antidiabetic drugs: Secondary | ICD-10-CM | POA: Diagnosis not present

## 2023-11-29 DIAGNOSIS — I614 Nontraumatic intracerebral hemorrhage in cerebellum: Secondary | ICD-10-CM | POA: Diagnosis present

## 2023-11-29 DIAGNOSIS — I161 Hypertensive emergency: Secondary | ICD-10-CM | POA: Diagnosis present

## 2023-11-29 DIAGNOSIS — R29702 NIHSS score 2: Secondary | ICD-10-CM | POA: Diagnosis present

## 2023-11-29 DIAGNOSIS — Z90710 Acquired absence of both cervix and uterus: Secondary | ICD-10-CM

## 2023-11-29 DIAGNOSIS — Z604 Social exclusion and rejection: Secondary | ICD-10-CM | POA: Diagnosis present

## 2023-11-29 DIAGNOSIS — Z79890 Hormone replacement therapy (postmenopausal): Secondary | ICD-10-CM

## 2023-11-29 DIAGNOSIS — G2581 Restless legs syndrome: Secondary | ICD-10-CM | POA: Diagnosis present

## 2023-11-29 DIAGNOSIS — I639 Cerebral infarction, unspecified: Secondary | ICD-10-CM | POA: Diagnosis not present

## 2023-11-29 DIAGNOSIS — R3989 Other symptoms and signs involving the genitourinary system: Secondary | ICD-10-CM | POA: Diagnosis not present

## 2023-11-29 DIAGNOSIS — E039 Hypothyroidism, unspecified: Secondary | ICD-10-CM | POA: Diagnosis present

## 2023-11-29 DIAGNOSIS — J45909 Unspecified asthma, uncomplicated: Secondary | ICD-10-CM | POA: Diagnosis present

## 2023-11-29 DIAGNOSIS — R270 Ataxia, unspecified: Secondary | ICD-10-CM | POA: Diagnosis present

## 2023-11-29 DIAGNOSIS — R29700 NIHSS score 0: Secondary | ICD-10-CM | POA: Diagnosis not present

## 2023-11-29 DIAGNOSIS — E119 Type 2 diabetes mellitus without complications: Secondary | ICD-10-CM

## 2023-11-29 DIAGNOSIS — Z91018 Allergy to other foods: Secondary | ICD-10-CM

## 2023-11-29 DIAGNOSIS — E785 Hyperlipidemia, unspecified: Secondary | ICD-10-CM | POA: Diagnosis present

## 2023-11-29 DIAGNOSIS — R29701 NIHSS score 1: Secondary | ICD-10-CM | POA: Diagnosis not present

## 2023-11-29 DIAGNOSIS — G43909 Migraine, unspecified, not intractable, without status migrainosus: Secondary | ICD-10-CM | POA: Diagnosis present

## 2023-11-29 DIAGNOSIS — Z635 Disruption of family by separation and divorce: Secondary | ICD-10-CM

## 2023-11-29 DIAGNOSIS — I629 Nontraumatic intracranial hemorrhage, unspecified: Secondary | ICD-10-CM | POA: Diagnosis not present

## 2023-11-29 DIAGNOSIS — Z882 Allergy status to sulfonamides status: Secondary | ICD-10-CM

## 2023-11-29 DIAGNOSIS — H5509 Other forms of nystagmus: Secondary | ICD-10-CM | POA: Diagnosis present

## 2023-11-29 DIAGNOSIS — Z881 Allergy status to other antibiotic agents status: Secondary | ICD-10-CM

## 2023-11-29 DIAGNOSIS — I619 Nontraumatic intracerebral hemorrhage, unspecified: Principal | ICD-10-CM

## 2023-11-29 DIAGNOSIS — R42 Dizziness and giddiness: Secondary | ICD-10-CM | POA: Diagnosis present

## 2023-11-29 DIAGNOSIS — Z888 Allergy status to other drugs, medicaments and biological substances status: Secondary | ICD-10-CM

## 2023-11-29 LAB — CBC
HCT: 45.9 % (ref 36.0–46.0)
Hemoglobin: 14.9 g/dL (ref 12.0–15.0)
MCH: 29.9 pg (ref 26.0–34.0)
MCHC: 32.5 g/dL (ref 30.0–36.0)
MCV: 92.2 fL (ref 80.0–100.0)
Platelets: 215 10*3/uL (ref 150–400)
RBC: 4.98 MIL/uL (ref 3.87–5.11)
RDW: 13.8 % (ref 11.5–15.5)
WBC: 12.6 10*3/uL — ABNORMAL HIGH (ref 4.0–10.5)
nRBC: 0 % (ref 0.0–0.2)

## 2023-11-29 LAB — COMPREHENSIVE METABOLIC PANEL
ALT: 19 U/L (ref 0–44)
AST: 24 U/L (ref 15–41)
Albumin: 4.2 g/dL (ref 3.5–5.0)
Alkaline Phosphatase: 56 U/L (ref 38–126)
Anion gap: 14 (ref 5–15)
BUN: 18 mg/dL (ref 8–23)
CO2: 22 mmol/L (ref 22–32)
Calcium: 9.5 mg/dL (ref 8.9–10.3)
Chloride: 106 mmol/L (ref 98–111)
Creatinine, Ser: 0.81 mg/dL (ref 0.44–1.00)
GFR, Estimated: >60 mL/min (ref >60)
Glucose, Bld: 206 mg/dL — ABNORMAL HIGH (ref 70–99)
Potassium: 3.8 mmol/L (ref 3.5–5.1)
Sodium: 142 mmol/L (ref 135–145)
Total Bilirubin: 0.6 mg/dL (ref 0.0–1.2)
Total Protein: 7.8 g/dL (ref 6.5–8.1)

## 2023-11-29 LAB — GLUCOSE, CAPILLARY: Glucose-Capillary: 200 mg/dL — ABNORMAL HIGH (ref 70–99)

## 2023-11-29 MED ORDER — ACETAMINOPHEN 160 MG/5ML PO SOLN: 650.0000 mg | ORAL | Status: DC | PRN

## 2023-11-29 MED ORDER — ACETAMINOPHEN 325 MG PO TABS
650.0000 mg | ORAL_TABLET | ORAL | Status: DC | PRN
  Administered 2023-11-30 (×2): 650 mg via ORAL
  Filled 2023-11-29 (×3): qty 2

## 2023-11-29 MED ORDER — ORAL CARE MOUTH RINSE: 15.0000 mL | OROMUCOSAL | Status: DC | PRN

## 2023-11-29 MED ORDER — STROKE: EARLY STAGES OF RECOVERY BOOK
Freq: Once | Status: AC
  Filled 2023-11-29: qty 1

## 2023-11-29 MED ORDER — SODIUM CHLORIDE 0.9% FLUSH: 10.0000 mL | INTRAVENOUS | Status: DC | PRN

## 2023-11-29 MED ORDER — CLEVIDIPINE BUTYRATE 0.5 MG/ML IV EMUL
0.0000 mg/h | INTRAVENOUS | Status: DC
  Administered 2023-11-29: 1 mg/h via INTRAVENOUS
  Administered 2023-11-30: 2 mg/h via INTRAVENOUS
  Administered 2023-11-30: 8 mg/h via INTRAVENOUS
  Administered 2023-11-30: 10 mg/h via INTRAVENOUS
  Filled 2023-11-29: qty 100
  Filled 2023-11-29 (×3): qty 50

## 2023-11-29 MED ORDER — INSULIN ASPART 100 UNIT/ML IJ SOLN
0.0000 [IU] | Freq: Three times a day (TID) | INTRAMUSCULAR | Status: DC
  Administered 2023-11-30: 3 [IU] via SUBCUTANEOUS
  Administered 2023-11-30 – 2023-12-04 (×4): 2 [IU] via SUBCUTANEOUS

## 2023-11-29 MED ORDER — SODIUM CHLORIDE 0.9% FLUSH
10.0000 mL | Freq: Two times a day (BID) | INTRAVENOUS | Status: DC
  Administered 2023-11-29 – 2023-12-04 (×10): 10 mL

## 2023-11-29 MED ORDER — MECLIZINE HCL 25 MG PO TABS
25.0000 mg | ORAL_TABLET | Freq: Once | ORAL | Status: DC
  Filled 2023-11-29: qty 1

## 2023-11-29 MED ORDER — ONDANSETRON 4 MG PO TBDP
4.0000 mg | ORAL_TABLET | Freq: Once | ORAL | Status: AC | PRN
  Administered 2023-11-29: 4 mg via ORAL
  Filled 2023-11-29: qty 1

## 2023-11-29 MED ORDER — ACETAMINOPHEN 650 MG RE SUPP: 650.0000 mg | RECTAL | Status: DC | PRN

## 2023-11-29 MED ORDER — PANTOPRAZOLE SODIUM 40 MG IV SOLR
40.0000 mg | Freq: Every day | INTRAVENOUS | Status: DC
  Administered 2023-11-29 – 2023-12-01 (×3): 40 mg via INTRAVENOUS
  Filled 2023-11-29 (×3): qty 10

## 2023-11-29 MED ORDER — SENNOSIDES-DOCUSATE SODIUM 8.6-50 MG PO TABS
1.0000 | ORAL_TABLET | Freq: Two times a day (BID) | ORAL | Status: DC
  Administered 2023-11-30 – 2023-12-04 (×9): 1 via ORAL
  Filled 2023-11-29 (×9): qty 1

## 2023-11-29 NOTE — Progress Notes (Signed)

## 2023-11-29 NOTE — Plan of Care (Addendum)
 Patient admitted to [facility] overnight. Patient remains on continuous infusion of Clevidipine. Midline PIV to LUE. NIHSS = 1 on my assessment; checks are a frequency of q 1 hour.   0515 hours: Patient has passed YSS and now has a diet order. Patient was also transported to/back from CT by this RN and 1 patient transporter; no obvious complications during or after the trip.   Problem: Education: Goal: Knowledge of disease or condition will improve Outcome: Progressing Goal: Knowledge of secondary prevention will improve (MUST DOCUMENT ALL) Outcome: Progressing Goal: Knowledge of patient specific risk factors will improve (DELETE if not current risk factor) Outcome: Progressing   Problem: Intracerebral Hemorrhage Tissue Perfusion: Goal: Complications of Intracerebral Hemorrhage will be minimized Outcome: Progressing   Problem: Coping: Goal: Will verbalize positive feelings about self Outcome: Progressing Goal: Will identify appropriate support needs Outcome: Progressing   Problem: Health Behavior/Discharge Planning: Goal: Ability to manage health-related needs will improve Outcome: Progressing Goal: Goals will be collaboratively established with patient/family Outcome: Progressing   Problem: Self-Care: Goal: Ability to participate in self-care as condition permits will improve Outcome: Progressing Goal: Verbalization of feelings and concerns over difficulty with self-care will improve Outcome: Progressing Goal: Ability to communicate needs accurately will improve Outcome: Progressing   Problem: Nutrition: Goal: Risk of aspiration will decrease Outcome: Progressing Goal: Dietary intake will improve Outcome: Progressing   Problem: Education: Goal: Knowledge of General Education information will improve Description: Including pain rating scale, medication(s)/side effects and non-pharmacologic comfort measures Outcome: Progressing   Problem: Health Behavior/Discharge  Planning: Goal: Ability to manage health-related needs will improve Outcome: Progressing   Problem: Clinical Measurements: Goal: Ability to maintain clinical measurements within normal limits will improve Outcome: Progressing Goal: Will remain free from infection Outcome: Progressing Goal: Diagnostic test results will improve Outcome: Progressing Goal: Respiratory complications will improve Outcome: Progressing Goal: Cardiovascular complication will be avoided Outcome: Progressing   Problem: Activity: Goal: Risk for activity intolerance will decrease Outcome: Progressing   Problem: Nutrition: Goal: Adequate nutrition will be maintained Outcome: Progressing   Problem: Coping: Goal: Level of anxiety will decrease Outcome: Progressing   Problem: Elimination: Goal: Will not experience complications related to bowel motility Outcome: Progressing Goal: Will not experience complications related to urinary retention Outcome: Progressing   Problem: Pain Managment: Goal: General experience of comfort will improve and/or be controlled Outcome: Progressing   Problem: Safety: Goal: Ability to remain free from injury will improve Outcome: Progressing   Problem: Skin Integrity: Goal: Risk for impaired skin integrity will decrease Outcome: Progressing

## 2023-11-29 NOTE — Progress Notes (Signed)
 eLink Physician-Brief Progress Note Patient Name: Sandra Patrick DOB: April 04, 1956 MRN: [id number]   Date of Service  11/29/2023  HPI/Events of Note  Patient admitted with an intracranial hemorrhage, likely hypertension induced. She is on Cleviprex.  eICU Interventions  New Patient Evaluation.        Thomasene Lot Jakobee Brackins 11/29/2023, 11:22 PM

## 2023-11-29 NOTE — ED Provider Triage Note (Addendum)
 Emergency Medicine Provider Triage Evaluation Note  Sandra Patrick , a 68 y.o. female  was evaluated in triage.  Pt complains of dizziness that began at 6 PM acutely.  Patient states that she feels that her head is spinning, denies ringing or ears or recent illnesses or chest pain shortness of breath fevers.  Patient states that she cannot walk as she feels unstable.  Patient's never had some like this happen before.  Review of Systems  Positive:  Negative:   Physical Exam  BP (!) 164/122   Pulse 93   Temp 97.6 F (36.4 C)   Resp 18   Ht 5\' 2"  (1.575 m)   Wt 80.7 kg   SpO2 100%   BMI 32.54 kg/m  Gen:   Awake, no distress   Resp:  Normal effort  MSK:   Moves extremities without difficulty  Other:  Persistent bilateral horizontal nystagmus, pupils PERRL, no vertical nystagmus noted  Medical Decision Making  Medically screening exam initiated at 8:20 PM.  Appropriate orders placed.  Sandra Patrick was informed that the remainder of the evaluation will be completed by another provider, this initial triage assessment does not replace that evaluation, and the importance of remaining in the ED until their evaluation is complete.  Workup initiated, patient given meclizine to help with symptoms.  Patient CT scan done that showed acute brain bleed according to the CT tech and so charge nurse was made aware.   Netta Corrigan, PA-C 11/29/23 2021    Netta Corrigan, PA-C 11/29/23 2056

## 2023-11-29 NOTE — ED Triage Notes (Signed)
 The pt had a sudden onset of dizziness around 1800 today with n and v

## 2023-11-29 NOTE — ED Notes (Signed)
 Neurologist at bedside.

## 2023-11-30 ENCOUNTER — Inpatient Hospital Stay (HOSPITAL_COMMUNITY): Payer: Medicare HMO

## 2023-11-30 DIAGNOSIS — I10 Essential (primary) hypertension: Secondary | ICD-10-CM | POA: Diagnosis not present

## 2023-11-30 DIAGNOSIS — I614 Nontraumatic intracerebral hemorrhage in cerebellum: Secondary | ICD-10-CM

## 2023-11-30 LAB — URINALYSIS, ROUTINE W REFLEX MICROSCOPIC
Bilirubin Urine: NEGATIVE
Glucose, UA: 50 mg/dL — AB
Hgb urine dipstick: NEGATIVE
Ketones, ur: 20 mg/dL — AB
Nitrite: POSITIVE — AB
Protein, ur: 30 mg/dL — AB
Specific Gravity, Urine: 1.016 (ref 1.005–1.030)
pH: 5 (ref 5.0–8.0)

## 2023-11-30 LAB — GLUCOSE, CAPILLARY
Glucose-Capillary: 161 mg/dL — ABNORMAL HIGH (ref 70–99)
Glucose-Capillary: 115 mg/dL — ABNORMAL HIGH (ref 70–99)
Glucose-Capillary: 138 mg/dL — ABNORMAL HIGH (ref 70–99)
Glucose-Capillary: 120 mg/dL — ABNORMAL HIGH (ref 70–99)

## 2023-11-30 LAB — LIPID PANEL
Cholesterol: 158 mg/dL (ref 0–200)
HDL: 64 mg/dL (ref >40)
LDL Cholesterol: 74 mg/dL (ref 0–99)
Total CHOL/HDL Ratio: 2.5 ratio
Triglycerides: 101 mg/dL (ref <150)
VLDL: 20 mg/dL (ref 0–40)

## 2023-11-30 LAB — ECHOCARDIOGRAM COMPLETE
Area-P 1/2: 2.83 cm2
Height: 62 in
S' Lateral: 2.7 cm
Weight: 2627.88 [oz_av]

## 2023-11-30 LAB — HEMOGLOBIN A1C
Hgb A1c MFr Bld: 6.7 % — ABNORMAL HIGH (ref 4.8–5.6)
Mean Plasma Glucose: 145.59 mg/dL

## 2023-11-30 LAB — MRSA NEXT GEN BY PCR, NASAL: MRSA by PCR Next Gen: NOT DETECTED

## 2023-11-30 LAB — HIV ANTIBODY (ROUTINE TESTING W REFLEX): HIV Screen 4th Generation wRfx: NONREACTIVE

## 2023-11-30 MED ORDER — LABETALOL HCL 5 MG/ML IV SOLN
10.0000 mg | INTRAVENOUS | Status: DC | PRN
  Administered 2023-11-30 (×2): 20 mg via INTRAVENOUS
  Filled 2023-11-30 (×2): qty 4

## 2023-11-30 MED ORDER — DULOXETINE HCL 30 MG PO CPEP
30.0000 mg | ORAL_CAPSULE | Freq: Every day | ORAL | Status: DC
  Administered 2023-11-30 – 2023-12-04 (×5): 30 mg via ORAL
  Filled 2023-11-30 (×5): qty 1

## 2023-11-30 MED ORDER — CHLORHEXIDINE GLUCONATE CLOTH 2 % EX PADS
6.0000 | MEDICATED_PAD | Freq: Every day | CUTANEOUS | Status: DC
  Administered 2023-11-30 – 2023-12-03 (×5): 6 via TOPICAL

## 2023-11-30 MED ORDER — MONTELUKAST SODIUM 10 MG PO TABS: 10.0000 mg | ORAL_TABLET | Freq: Every day | ORAL | Status: DC | PRN

## 2023-11-30 MED ORDER — CHLORHEXIDINE GLUCONATE CLOTH 2 % EX PADS
6.0000 | MEDICATED_PAD | Freq: Every day | CUTANEOUS | Status: DC
  Administered 2023-11-29: 6 via TOPICAL

## 2023-11-30 MED ORDER — HYDRALAZINE HCL 20 MG/ML IJ SOLN: 10.0000 mg | INTRAMUSCULAR | Status: DC | PRN

## 2023-11-30 MED ORDER — LEVOTHYROXINE SODIUM 25 MCG PO TABS
125.0000 ug | ORAL_TABLET | Freq: Every day | ORAL | Status: DC
  Administered 2023-11-30 – 2023-12-04 (×5): 125 ug via ORAL
  Filled 2023-11-30 (×5): qty 1

## 2023-11-30 MED ORDER — NITROFURANTOIN MONOHYD MACRO 100 MG PO CAPS
100.0000 mg | ORAL_CAPSULE | Freq: Two times a day (BID) | ORAL | Status: DC
  Administered 2023-11-30 – 2023-12-04 (×9): 100 mg via ORAL
  Filled 2023-11-30 (×11): qty 1

## 2023-11-30 MED ORDER — BUTALBITAL-APAP-CAFFEINE 50-325-40 MG PO TABS
1.0000 | ORAL_TABLET | ORAL | Status: DC | PRN
  Administered 2023-11-30 – 2023-12-03 (×3): 1 via ORAL
  Filled 2023-11-30 (×4): qty 1

## 2023-11-30 MED ORDER — AMLODIPINE BESYLATE 5 MG PO TABS
5.0000 mg | ORAL_TABLET | Freq: Every day | ORAL | Status: DC
  Administered 2023-11-30 – 2023-12-03 (×4): 5 mg via ORAL
  Filled 2023-11-30 (×4): qty 1

## 2023-11-30 NOTE — Progress Notes (Signed)
  Echocardiogram 2D Echocardiogram has been performed.  Sandra Patrick 11/30/2023, 2:34 PM

## 2023-11-30 NOTE — Progress Notes (Addendum)
 STROKE TEAM PROGRESS NOTE    SIGNIFICANT HOSPITAL EVENTS 2/21 scented with acute onset of vertigo, nausea and vomiting.  CT head with right cerebellum ICH-admitted to ICU  INTERIM HISTORY/SUBJECTIVE No family at the bedside.  Patient is awake alert and oriented, complains of a headache and Tylenol is not working We will order Fioricet UA positive for UTI will start antibiotics A1c 6.7 Echo is pending   OBJECTIVE  CBC    Component Value Date/Time   WBC 12.6 (H) 11/29/2023 2024   RBC 4.98 11/29/2023 2024   HGB 14.9 11/29/2023 2024   HCT 45.9 11/29/2023 2024   PLT 215 11/29/2023 2024   MCV 92.2 11/29/2023 2024   MCH 29.9 11/29/2023 2024   MCHC 32.5 11/29/2023 2024   RDW 13.8 11/29/2023 2024   LYMPHSABS 2.2 01/03/2017 1834   MONOABS 0.9 01/03/2017 1834   EOSABS 0.1 01/03/2017 1834   BASOSABS 0.0 01/03/2017 1834    BMET    Component Value Date/Time   NA 142 11/29/2023 2024   K 3.8 11/29/2023 2024   CL 106 11/29/2023 2024   CO2 22 11/29/2023 2024   GLUCOSE 206 (H) 11/29/2023 2024   BUN 18 11/29/2023 2024   CREATININE 0.81 11/29/2023 2024   CREATININE 0.81 12/16/2013 1018   CALCIUM 9.5 11/29/2023 2024   GFRNONAA >60 11/29/2023 2024   GFRNONAA 82 12/16/2013 1018    IMAGING past 24 hours CT HEAD WO CONTRAST ( ) Result Date: 11/30/2023 CLINICAL DATA:  68 year old female with dizziness, right cerebellar hemorrhage. EXAM: CT HEAD WITHOUT CONTRAST TECHNIQUE: Contiguous axial images were obtained from the base of the skull through the vertex without intravenous contrast. RADIATION DOSE REDUCTION: This exam was performed according to the departmental dose-optimization program which includes automated exposure control, adjustment of the mA and/or kV according to patient size and/or use of iterative reconstruction technique. COMPARISON:  Head CT 11/29/2023. FINDINGS: Brain: Right deep cerebellar hyperdense hemorrhage with extension into the 4th ventricle. Estimated intra-axial  component is stable, 15 x 18 x 10 mm (AP by transverse by CC), about 1 mL. Unchanged volume of blood in the 4th ventricle and cisterna magna. Basilar cisterns remain patent. No posterior fossa mass effect. No discrete cerebellar edema. Stable gray-white matter differentiation throughout the brain. No ventriculomegaly. No supratentorial mass effect or midline shift. Moderate patchy cerebral white matter and deep gray nuclei hypodense heterogeneity. Vascular: Calcified atherosclerosis at the skull base. No suspicious intracranial vascular hyperdensity. Skull: Intact, No acute osseous abnormality identified. Sinuses/Orbits: Visualized paranasal sinuses and mastoids are stable and well aerated. Other: Stable leftward gaze. Visualized scalp soft tissues are within normal limits. IMPRESSION: 1. Unchanged small volume of hemorrhage in the central right cerebellum, 4th ventricle, cisterna magna. No cerebellar edema, posterior fossa mass effect, or complicating features. 2. No new intracranial abnormality. Evidence of underlying small vessel disease. Electronically Signed   By: Sandra Patrick M.D.   On: 11/30/2023 05:55   CT Head Wo Contrast Result Date: 11/29/2023 CLINICAL DATA:  Dizziness EXAM: CT HEAD WITHOUT CONTRAST TECHNIQUE: Contiguous axial images were obtained from the base of the skull through the vertex without intravenous contrast. RADIATION DOSE REDUCTION: This exam was performed according to the departmental dose-optimization program which includes automated exposure control, adjustment of the mA and/or kV according to patient size and/or use of iterative reconstruction technique. COMPARISON:  None Available. FINDINGS: Brain: There is acute intraparenchymal hemorrhage within the dentate nucleus of the right cerebellum, extending into the fourth ventricle. No hydrocephalus. Chronic ischemic white matter  changes. No midline shift or other mass effect. The intraparenchymal hematoma measures approximately 1.4 x 1.1 cm.  Vascular: Carotid calcification Skull: Negative Sinuses/Orbits: Paranasal sinuses are clear.  Normal orbits. Other: None IMPRESSION: 1. Acute intraparenchymal hemorrhage within the dentate nucleus of the right cerebellum, extending into the fourth ventricle. This location is most typical of hypertensive hemorrhage. 2. No hydrocephalus. Critical Value/emergent results were called by telephone at the time of interpretation on 11/29/2023 at 9:09 pm to provider [facility] , who verbally acknowledged these results. Electronically Signed   By: Sandra Patrick M.D.   On: 11/29/2023 21:10    Vitals:   11/30/23 1200 11/30/23 1230 11/30/23 1300 11/30/23 1310  BP: (!) 133/98 (!) 148/96 (!) (P) 155/89 128/75  Pulse: 75 69 65 70  Resp: 20 11 11    Temp: 98.7 F (37.1 C)     TempSrc: Oral     SpO2: 96% 97% 91% 96%  Weight:      Height:         PHYSICAL EXAM General:  Alert, well-nourished, well-developed patient in no acute distress Psych:  Mood and affect appropriate for situation CV: Regular rate and rhythm on monitor Respiratory:  Regular, unlabored respirations on room air GI: Abdomen soft and nontender   NEURO:  Mental Status: AA&Ox3, patient is able to give clear and coherent history Speech/Language: speech is without dysarthria or aphasia.  Naming, repetition, fluency, and comprehension intact.  Cranial Nerves:  II: PERRL. Visual fields full.  III, IV, VI: Eyes are disconjugate with left exotropia-she states is baseline V: Sensation is intact to light touch and symmetrical to face.  VII: Face is symmetrical resting and smiling VIII: hearing intact to voice. IX, X: Palate elevates symmetrically. Phonation is normal.  ZO:[id number] shrug 5/5. XII: tongue is midline without fasciculations. Motor: 5/5 strength to all muscle groups tested.  Tone: is normal and bulk is normal Sensation- Intact to light touch bilaterally. Extinction absent to light touch to DSS.   Coordination: Ataxia in right  arm HKS: no ataxia in BLE.No drift.  Gait- deferred  Most Recent NIH  1a Level of Conscious.: 0 1b LOC Questions: 0 1c LOC Commands: 0 2 Best Gaze: 0 3 Visual: 0 4 Facial Palsy: 0 5a Motor Arm - left: 0 5b Motor Arm - Right: 0 6a Motor Leg - Left: 0 6b Motor Leg - Right: 0 7 Limb Ataxia: 1 8 Sensory: 0 9 Best Language: 0 10 Dysarthria: 0 11 Extinct. and Inatten.: 0 TOTAL: 0   ASSESSMENT/PLAN  Sandra Patrick is a 68 y.o. female with history of asthma, diabetes, hyperlipidemia, hypertension, irregular heartbeat, migraines, seizures, hypothyroidism, restless leg syndrome admitted for acute onset dizziness, nausea and vomiting.  NIH on Admission 1  Intracerebral Hemorrhage: Right cerebellum-nontraumatic Etiology: Likely hypertensive CT head acute right cerebellum ICH extending into fourth ventricle Repeat CT head stable 2D Echo ordered LDL ordered HgbA1c 6.7 VTE prophylaxis -SCDs No antithrombotic prior to admission, now on No antithrombotic  Therapy recommendations:  Pending Disposition: Pending   Hypertension Home meds: Amlodipine 5 mg, Un Stable Cleviprex was on for short period of time currently is off Amlodipine restarted Blood Pressure Goal: SBP between 130-150 for 24 hours and then less than 160   Hyperlipidemia Home meds: None LDL ordered goal < 70   Diabetes type II Controlled Home meds: Trulicity HgbA1c 6.7, goal < 7.0 CBGs SSI Recommend close follow-up with PCP for better DM control   Dysphagia Patient has post-stroke dysphagia, SLP  consulted    Diet   Diet Carb Modified Fluid consistency: Thin; Room service appropriate? Yes with Assist   Advance diet as tolerated  UTI  UA positive Start Macrobid today  Other Stroke Risk Factors Obesity, Body mass index is 30.04 kg/m., BMI >/= 30 associated with increased stroke risk, recommend weight loss, diet and exercise as appropriate  Migraines   Other Active Problems   Hospital day #  1 Gevena Mart DNP, [id number]  [facility]   I, the attending neurologist, have personally obtained a history, examined the patient, evaluated laboratory data, individually viewed imaging studies and agree with radiology interpretations. Together with the NP/PA, we formulated the assessment and plan of care which reflects our mutual decision.  I have made any additions or clarifications directly to the above note and agree with the findings and plan as currently documented. I spent a total of 35 minutes dedicated to the care of this patient.    Windell Norfolk, MD Neurology 11/30/2023 5:49 PM    To contact Stroke Continuity provider, please refer to [email or web address]. After hours, contact General Neurology

## 2023-11-30 NOTE — Plan of Care (Signed)
  Problem: Education: Goal: Knowledge of disease or condition will improve Outcome: Progressing   Problem: Coping: Goal: Will verbalize positive feelings about self Outcome: Progressing   Problem: Nutrition: Goal: Risk of aspiration will decrease Outcome: Progressing   Problem: Education: Goal: Knowledge of General Education information will improve Description: Including pain rating scale, medication(s)/side effects and non-pharmacologic comfort measures Outcome: Progressing

## 2023-11-30 NOTE — H&P (Addendum)
 NEUROLOGY H&P NOTE   Date of service: November 30, 2023 Patient Name: Sandra Patrick MRN:  [id number] DOB:  13-Aug-1956 Chief Complaint: "Dizziness"  History of Present Illness  Henslee Lottman Siess is a 68 y.o. female with hx of hypertension, hyperlipidemia, diabetes, seizure who presents with acute onset vertigo and nausea and vomiting earlier this evening.   Last known well: 6 PM Modified rankin score: 0-Completely asymptomatic and back to baseline post- stroke ICH Score: 2 tNKASE: Not offered due to ICH Thrombectomy: not offered due to ICH NIHSS components Score: Comment  1a Level of Conscious 0[]  1[]  2[]  3[]      1b LOC Questions 0[]  1[]  2[]       1c LOC Commands 0[]  1[]  2[]       2 Best Gaze 0[]  1[]  2[]       3 Visual 0[]  1[]  2[]  3[]      4 Facial Palsy 0[]  1[]  2[]  3[]      5a Motor Arm - left 0[]  1[]  2[]  3[]  4[]  UN[]    5b Motor Arm - Right 0[]  1[]  2[]  3[]  4[]  UN[]    6a Motor Leg - Left 0[]  1[]  2[]  3[]  4[]  UN[]    6b Motor Leg - Right 0[]  1[]  2[]  3[]  4[]  UN[]    7 Limb Ataxia 0[]  1[]  2[]  3[]  UN[]     8 Sensory 0[]  1[]  2[]  UN[]      9 Best Language 0[]  1[]  2[]  3[]      10 Dysarthria 0[]  1[]  2[]  UN[]      11 Extinct. and Inattention 0[]  1[]  2[]       TOTAL:       Past History   Past Medical History:  Diagnosis Date   Asthma    Diabetes mellitus without complication (HCC)    Headache    Hyperlipemia    Hypertension    Irregular heart beat    Migraine    Restless leg syndrome    Seizure (HCC)    Thyroid disease    Past Surgical History:  Procedure Laterality Date   ABDOMINAL HYSTERECTOMY     ADENOIDECTOMY     CARPAL TUNNEL RELEASE     CESAREAN SECTION     EYE SURGERY     TONSILLECTOMY     Family History  Problem Relation Age of Onset   Heart disease Mother    Heart disease Father    Diabetes Brother    Arthritis Brother    Social History   Socioeconomic History   Marital status: Divorced    Spouse name: Not on file   Number of children: 2   Years of education: 12    Highest education level: Not on file  Occupational History   Occupation: Recovery     Employer: [employer]  Tobacco Use   Smoking status: Never   Smokeless tobacco: Never  Substance and Sexual Activity   Alcohol use: No   Drug use: No   Sexual activity: Yes  Other Topics Concern   Not on file  Social History Narrative   Lives at home with a friend.    Right handed.   Caffeine use: 1/2 cup coffee in the morning and 1 glass of tea per day.    Social Drivers of Corporate investment banker Strain: Not on file  Food Insecurity: No Food Insecurity (11/29/2023)   Hunger Vital Sign    Worried About Running Out of Food in the Last Year: Never true    Ran Out of Food in the Last Year: Never true  Transportation  Needs: No Transportation Needs (11/29/2023)   PRAPARE - Administrator, Civil Service (Medical): No    Lack of Transportation (Non-Medical): No  Physical Activity: Not on file  Stress: Not on file  Social Connections: Socially Isolated (11/29/2023)   Social Connection and Isolation Panel [NHANES]    Frequency of Communication with Friends and Family: More than three times a week    Frequency of Social Gatherings with Friends and Family: Never    Attends Religious Services: Never    Database administrator or Organizations: No    Attends Engineer, structural: Never    Marital Status: Divorced   Allergies  Allergen Reactions   Gabapentin Itching and Other (See Comments)    Other reaction(s): Facial Edema (intolerance); chronic diarrhea   Chocolate Nausea Only and Other (See Comments)    Face feels puffy   Diclofenac Itching   Ceclor [Cefaclor] Rash   Codeine Swelling and Rash   Metformin And Related Diarrhea   Penicillins Rash   Sulfa Antibiotics Rash   Zithromax [Azithromycin] Rash    Medications   Medications Prior to Admission  Medication Sig Dispense Refill Last Dose/Taking   acetaminophen (TYLENOL) 325 MG tablet Take by mouth every 6 (six) hours  as needed for headache or mild pain (pain score 1-3).   11/29/2023 Evening   albuterol (PROVENTIL HFA;VENTOLIN HFA) 108 (90 Base) MCG/ACT inhaler Inhale 2 puffs into the lungs every 6 (six) hours as needed for wheezing or shortness of breath. 1 Inhaler 0 11/28/2023   amLODipine (NORVASC) 5 MG tablet Take 5 mg by mouth daily.   11/29/2023 Morning   DULoxetine (CYMBALTA) 30 MG capsule Take 30 mg by mouth daily.   11/29/2023 Morning   levothyroxine (SYNTHROID) 125 MCG tablet Take 125 mcg by mouth daily.   11/29/2023 Morning   montelukast (SINGULAIR) 10 MG tablet Take 10 mg by mouth as needed (allergy).   Past Week   rosuvastatin (CRESTOR) 40 MG tablet Take 40 mg by mouth at bedtime.   11/28/2023 Bedtime   TRULICITY 1.5 MG/0.5ML SOAJ Inject 1.5 mg into the skin once a week. Inject on Thursday   11/28/2023 Morning   gabapentin (NEURONTIN) 100 MG capsule Take 100 mg by mouth 3 (three) times daily. (Patient not taking: Reported on 11/29/2023)   Not Taking     Vitals   Vitals:   11/30/23 0230 11/30/23 0233 11/30/23 0245 11/30/23 0300  BP: 129/73 135/73 132/72 130/72  Pulse: 81 79 77 79  Resp: 19 (!) 21 (!) 27 16  Temp:      TempSrc:      SpO2: 93% 93% 94% 94%  Weight:      Height:         Body mass index is 30.04 kg/m.  Physical Exam   Constitutional: Appears well-developed and well-nourished.  Psych: Affect appropriate to situation.  Eyes: No scleral injection.  HENT: No OP obstruction.  Head: Normocephalic.  Cardiovascular: Normal rate and regular rhythm.  Respiratory: Effort normal, non-labored breathing.  GI: Soft.  No distension. There is no tenderness.  Skin: WDI.   Neurologic Examination   Neuro: Mental Status: Patient is awake, alert, oriented to person, place, month, year, and situation. Patient is able to give a clear and coherent history. No signs of aphasia or neglect Cranial Nerves: II: Visual Fields are full. Pupils are equal, round, and reactive to light.   III,IV, VI:  She has a left exotropia which she states is baseline  for V: Facial sensation is symmetric to temperature VII: Facial movement with?  Left facial weakness VIII: hearing is intact to voice X: Uvula elevates symmetrically XII: tongue is midline without atrophy or fasciculations.  Motor: Tone is normal. Bulk is normal. 5/5 strength was present in all four extremities.  Sensory: Sensation is symmetric to light touch and temperature in the arms and legs. Deep Tendon Reflexes: 2+ and symmetric in the biceps and patellae.  Plantars: Toes are downgoing bilaterally.  Cerebellar: FNF impaired in the right arm    Labs   CBC:  Recent Labs  Lab 11/29/23 2024  WBC 12.6*  HGB 14.9  HCT 45.9  MCV 92.2  PLT 215    Basic Metabolic Panel:  Lab Results  Component Value Date   NA 142 11/29/2023   K 3.8 11/29/2023   CO2 22 11/29/2023   GLUCOSE 206 (H) 11/29/2023   BUN 18 11/29/2023   CREATININE 0.81 11/29/2023   CALCIUM 9.5 11/29/2023   GFRNONAA >60 11/29/2023   GFRAA >60 01/16/2017   Lipid Panel:  Lab Results  Component Value Date   LDLCALC 79 04/13/2014   HgbA1c:  Lab Results  Component Value Date   HGBA1C 6.1 (H) 04/13/2014   Urine Drug Screen: No results found for: "LABOPIA", "COCAINSCRNUR", "LABBENZ", "AMPHETMU", "THCU", "LABBARB"  Alcohol Level     Component Value Date/Time   ETH <5 10/25/2014 1622   INR  Lab Results  Component Value Date   INR 0.96 10/25/2014   APTT No results found for: "APTT"   CT Head without contrast(Personally reviewed): Cerebellar hemorrhage extending into the fourth ventricle    Impression   Gage Treiber Edge is a 68 y.o. female with cerebellar hemorrhage that is likely hypertensive in etiology.  The extension into the fourth ventricle is very concerning for possible development of hydrocephalus and I would favor relatively short-term repeat head CT.  If she does develop any mental status changes, then repeat head CT will need to be  performed earlier.    I discussed CODE STATUS with the patient who expressed wish to be full code.  She also expressed a wish that if she were to emergently need a procedure such as EVD, that she would want to proceed with it.  Primary Diagnosis:  Nontraumatic intracerebral hemorrhage in cerebellum  Secondary Diagnosis: Essential (primary) hypertension and Hypertension Emergency (SBP > 180 or DBP > 120 & end organ damage)  diabetes with hyperglycemia  Recommendations  1) Admit to ICU 2) no antiplatelets or anticoagulants 3) blood pressure control with goal systolic 130 - 150 4) Frequent neuro checks 5) If symptoms worsen or there is decreased mental status, repeat stat head CT 6) PT,OT,ST 7) repeat head CT 8 hours from initial 8) SSI for DM ______________________________________________________________________  This patient is critically ill and at significant risk of neurological worsening, death and care requires constant monitoring of vital signs, hemodynamics,respiratory and cardiac monitoring, neurological assessment, discussion with family, other specialists and medical decision making of high complexity. I spent 52 minutes of neurocritical care time  in the care of  this patient. This was time spent independent of any time provided by nurse practitioner or PA.  Ritta Slot, MD [facility]   If 7pm- 7am, please page neurology on call as listed in AMION. 11/30/2023  3:59 AM

## 2023-11-30 NOTE — Evaluation (Signed)
 Physical Therapy Evaluation Patient Details Name: Sandra Patrick MRN: [id number] DOB: 14-Mar-1956 Today's Date: 11/30/2023  History of Present Illness  68 y.o. female admitted on 11/29/23 for acute onset dizziness and HA.  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  Pt with significant PMH of DM, HTN, seizure.  Clinical Impression  Pt was min assist to stand EOB and required a RW for safe OOB to chair mobility.  She was self limiting (asked to walk to the bathroom or in the hallway and she declined).  She would benefit from continued PT to ensure she is safe to d/c home with daughter with anticipated OP neuro PT follow up.  PT to follow acutely for deficits listed below.         If plan is discharge home, recommend the following: A little help with walking and/or transfers;A little help with bathing/dressing/bathroom;Assistance with cooking/housework;Assist for transportation;Help with stairs or ramp for entrance   Can travel by private vehicle        Equipment Recommendations Rolling walker (2 wheels)  Recommendations for Other Services       Functional Status Assessment Patient has had a recent decline in their functional status and demonstrates the ability to make significant improvements in function in a reasonable and predictable amount of time.     Precautions / Restrictions Precautions Precautions: Fall      Mobility  Bed Mobility Overal bed mobility: Modified Independent                  Transfers Overall transfer level: Needs assistance   Transfers: Sit to/from Stand Sit to Stand: Min assist           General transfer comment: Pt stood twice, once with me and no AD, and once with me and RW.  Both required min assist and reliance on UE.  Standing marching without AD, pt lost her balance and we decided to use the RW to be safe.  She declined walking in the hallway, but was agreeable to OOB to recliner chair.     Ambulation/Gait               General Gait Details: Pt declined walking to the bathroom or into the hallway, she took a few steps with RW from bed to chair min assist with RW  Stairs            Wheelchair Mobility     Tilt Bed    Modified Rankin (Stroke Patients Only) Modified Rankin (Stroke Patients Only) Pre-Morbid Rankin Score: No symptoms Modified Rankin: Moderately severe disability     Balance Overall balance assessment: Needs assistance Sitting-balance support: Feet supported, No upper extremity supported Sitting balance-Leahy Scale: Good     Standing balance support: Single extremity supported Standing balance-Leahy Scale: Poor Standing balance comment: needs external support in standing.                             Pertinent Vitals/Pain Pain Assessment Pain Assessment: Faces Faces Pain Scale: Hurts a little bit Pain Location: head Pain Descriptors / Indicators: Aching Pain Intervention(s): Limited activity within patient's tolerance, Monitored during session, Repositioned    Home Living Family/patient expects to be discharged to:: Private residence Living Arrangements: Non-relatives/Friends Available Help at Discharge: Family;Available PRN/intermittently Type of Home: House Home Access: Stairs to enter Entrance Stairs-Rails: Right Entrance Stairs-Number of Steps: 5   Home Layout: One level Home Equipment: None  Additional Comments: pt reports she can drive, but does not, and 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 She says she is going home with her daughter when she gets here from [place].    Prior Function Prior Level of Function : Independent/Modified Independent                     Extremity/Trunk Assessment   Upper Extremity Assessment Upper Extremity Assessment: Defer to OT evaluation    Lower Extremity Assessment Lower Extremity Assessment: Overall WFL for tasks assessed (per seated MMT WNL, I did not  see any differences in sensation to LT or coordination with heel to shin test)       Communication   Communication Communication: No apparent difficulties    Cognition Arousal: Alert Behavior During Therapy: Flat affect   PT - Cognitive impairments: No apparent impairments                         Following commands: Intact       Cueing       General Comments General comments (skin integrity, edema, etc.): VSS throughout    Exercises     Assessment/Plan    PT Assessment Patient needs continued PT services  PT Problem List Decreased balance;Decreased mobility;Decreased coordination;Obesity       PT Treatment Interventions DME instruction;Gait training;Stair training;Therapeutic exercise;Therapeutic activities;Functional mobility training;Balance training;Neuromuscular re-education;Patient/family education    PT Goals (Current goals can be found in the Care Plan section)  Acute Rehab PT Goals Patient Stated Goal: to go home with her daughter tomorrow PT Goal Formulation: With patient Time For Goal Achievement: 12/14/23 Potential to Achieve Goals: Good    Frequency Min 1X/week     Co-evaluation               AM-PAC PT "6 Clicks" Mobility  Outcome Measure Help needed turning from your back to your side while in a flat bed without using bedrails?: None Help needed moving from lying on your back to sitting on the side of a flat bed without using bedrails?: None Help needed moving to and from a bed to a chair (including a wheelchair)?: A Little Help needed standing up from a chair using your arms (e.g., wheelchair or bedside chair)?: A Little Help needed to walk in hospital room?: A Little Help needed climbing 3-5 steps with a railing? : A Lot 6 Click Score: 19    End of Session Equipment Utilized During Treatment: Gait belt Activity Tolerance: Other (comment) (pt self limiting) Patient left: in chair;with call bell/phone within reach;with chair  alarm set Nurse Communication: Mobility status PT Visit Diagnosis: Difficulty in walking, not elsewhere classified (R26.2);Unsteadiness on feet (R26.81)    Time: 4742-5956 PT Time Calculation (min) (ACUTE ONLY): 19 min   Charges:   PT Evaluation $PT Eval Moderate Complexity: 1 Mod   PT General Charges $$ ACUTE PT VISIT: 1 Visit        Corinna Capra, PT, DPT  Acute Rehabilitation Secure chat is best for contact #[phone] office    11/30/2023, 6:31 PM

## 2023-12-01 LAB — GLUCOSE, CAPILLARY
Glucose-Capillary: 111 mg/dL — ABNORMAL HIGH (ref 70–99)
Glucose-Capillary: 125 mg/dL — ABNORMAL HIGH (ref 70–99)
Glucose-Capillary: 126 mg/dL — ABNORMAL HIGH (ref 70–99)

## 2023-12-01 MED ORDER — HYDROXYZINE HCL 10 MG PO TABS: 10.0000 mg | ORAL_TABLET | Freq: Three times a day (TID) | ORAL | Status: DC | PRN

## 2023-12-01 MED ORDER — ROSUVASTATIN CALCIUM 20 MG PO TABS
40.0000 mg | ORAL_TABLET | Freq: Every day | ORAL | Status: DC
  Administered 2023-12-01 – 2023-12-04 (×4): 40 mg via ORAL
  Filled 2023-12-01 (×4): qty 2

## 2023-12-01 NOTE — Progress Notes (Signed)
 STROKE TEAM PROGRESS NOTE    SIGNIFICANT HOSPITAL EVENTS 2/21 scented with acute onset of vertigo, nausea and vomiting.  CT head with right cerebellum ICH-admitted to ICU  INTERIM HISTORY/SUBJECTIVE Family at bedside.  Patient is awake alert and oriented, headaches are better. Appears anxious  Started on Macrobid for UTI. Echo completed    OBJECTIVE  CBC    Component Value Date/Time   WBC 12.6 (H) 11/29/2023 2024   RBC 4.98 11/29/2023 2024   HGB 14.9 11/29/2023 2024   HCT 45.9 11/29/2023 2024   PLT 215 11/29/2023 2024   MCV 92.2 11/29/2023 2024   MCH 29.9 11/29/2023 2024   MCHC 32.5 11/29/2023 2024   RDW 13.8 11/29/2023 2024   LYMPHSABS 2.2 01/03/2017 1834   MONOABS 0.9 01/03/2017 1834   EOSABS 0.1 01/03/2017 1834   BASOSABS 0.0 01/03/2017 1834    BMET    Component Value Date/Time   NA 142 11/29/2023 2024   K 3.8 11/29/2023 2024   CL 106 11/29/2023 2024   CO2 22 11/29/2023 2024   GLUCOSE 206 (H) 11/29/2023 2024   BUN 18 11/29/2023 2024   CREATININE 0.81 11/29/2023 2024   CREATININE 0.81 12/16/2013 1018   CALCIUM 9.5 11/29/2023 2024   GFRNONAA >60 11/29/2023 2024   GFRNONAA 82 12/16/2013 1018    IMAGING past 24 hours ECHOCARDIOGRAM COMPLETE Result Date: 11/30/2023    ECHOCARDIOGRAM REPORT   Patient Name:   Sandra Patrick Prestigiacomo Date of Exam: 11/30/2023 Medical Rec #:  [id number]     Height:       62.0 in Accession #:    [id number]    Weight:       164.2 lb Date of Birth:  12-13-1955     BSA:          1.758 m Patient Age:    68 years      BP:           127/70 mmHg Patient Gender: F             HR:           68 bpm. Exam Location:  Inpatient Procedure: 2D Echo, Cardiac Doppler and Color Doppler (Both Spectral and Color            Flow Doppler were utilized during procedure).                                 MODIFIED REPORT:   This report was modified by Tessa Lerner on 11/30/2023 due to Need to add the                              conclusion statement.  Indications:     Stroke I63.9   History:         Patient has prior history of Echocardiogram examinations, most                  recent 07/17/2013. Risk Factors:Hypertension.  Sonographer:     Rosaland Lao Referring Phys:  Tara.Kingdom MCNEILL P KIRKPATRICK Diagnosing Phys: Tessa Lerner IMPRESSIONS  1. Left ventricular ejection fraction, by estimation, is 60 to 65%. The left ventricle has normal function. The left ventricle has no regional wall motion abnormalities. Left ventricular diastolic parameters were normal.  2. Right ventricular systolic function is normal. The right ventricular size is normal. There is normal pulmonary artery systolic pressure.  3. The mitral valve is degenerative. No evidence of mitral valve regurgitation. No evidence of mitral stenosis.  4. The aortic valve was not well visualized. Aortic valve regurgitation is not visualized. No aortic stenosis is present. Comparison(s): No prior Echocardiogram. Conclusion(s)/Recommendation(s): No intracardiac source of embolism detected on this transthoracic study. Consider a transesophageal echocardiogram to exclude cardiac source of embolism if clinically indicated. FINDINGS  Left Ventricle: Left ventricular ejection fraction, by estimation, is 60 to 65%. The left ventricle has normal function. The left ventricle has no regional wall motion abnormalities. Strain imaging was not performed. The left ventricular internal cavity  size was normal in size. There is no left ventricular hypertrophy of the basal-septal segment. Left ventricular diastolic parameters were normal. Right Ventricle: The right ventricular size is normal. No increase in right ventricular wall thickness. Right ventricular systolic function is normal. There is normal pulmonary artery systolic pressure. The tricuspid regurgitant velocity is 2.26 m/s, and  with an assumed right atrial pressure of 3 mmHg, the estimated right ventricular systolic pressure is 23.4 mmHg. Left Atrium: Left atrial size was normal in size. Right  Atrium: Right atrial size was normal in size. Pericardium: There is no evidence of pericardial effusion. Mitral Valve: The mitral valve is degenerative in appearance. Mild mitral annular calcification. No evidence of mitral valve regurgitation. No evidence of mitral valve stenosis. Tricuspid Valve: The tricuspid valve is normal in structure. Tricuspid valve regurgitation is mild . No evidence of tricuspid stenosis. Aortic Valve: The aortic valve was not well visualized. Aortic valve regurgitation is not visualized. No aortic stenosis is present. Pulmonic Valve: The pulmonic valve was grossly normal. Pulmonic valve regurgitation is trivial. No evidence of pulmonic stenosis. Aorta: The aortic root and ascending aorta are structurally normal, with no evidence of dilitation. IAS/Shunts: The atrial septum is grossly normal. Additional Comments: 3D imaging was not performed.  LEFT VENTRICLE PLAX 2D LVIDd:         4.50 cm   Diastology LVIDs:         2.70 cm   LV e' medial:    4.54 cm/s LV PW:         0.80 cm   LV E/e' medial:  12.3 LV IVS:        0.90 cm   LV e' lateral:   4.20 cm/s LVOT diam:     2.10 cm   LV E/e' lateral: 13.3 LV SV:         67 LV SV Index:   38 LVOT Area:     3.46 cm  RIGHT VENTRICLE             IVC RV Basal diam:  2.60 cm     IVC diam: 1.50 cm RV S prime:     12.60 cm/s TAPSE (M-mode): 2.2 cm LEFT ATRIUM             Index        RIGHT ATRIUM           Index LA diam:        4.00 cm 2.28 cm/m   RA Area:     10.90 cm LA Vol (A2C):   41.2 ml 23.43 ml/m  RA Volume:   18.40 ml  10.47 ml/m LA Vol (A4C):   51.2 ml 29.12 ml/m LA Biplane Vol: 46.2 ml 26.28 ml/m  AORTIC VALVE LVOT Vmax:   82.90 cm/s LVOT Vmean:  50.100 cm/s LVOT VTI:    0.193 m  AORTA Ao Root diam:  2.80 cm Ao Asc diam:  3.70 cm MITRAL VALVE               TRICUSPID VALVE MV Area (PHT): 2.83 cm    TR Peak grad:   20.4 mmHg MV Decel Time: 268 msec    TR Vmax:        226.00 cm/s MV E velocity: 55.80 cm/s MV A velocity: 82.10 cm/s  SHUNTS MV  E/A ratio:  0.68        Systemic VTI:  0.19 m                            Systemic Diam: 2.10 cm Sunit Tolia Electronically signed by Tessa Lerner Signature Date/Time: 11/30/2023/2:59:23 PM    Final (Updated)     Vitals:   12/01/23 1000 12/01/23 1100 12/01/23 1200 12/01/23 1300  BP: (!) 153/95 (!) 135/103 (!) 144/114 126/89  Pulse: 67 64 60 62  Resp: 16 18 11 14   Temp:   98.2 F (36.8 C)   TempSrc:   Oral   SpO2: 95% 91% (!) 87% 93%  Weight:      Height:         PHYSICAL EXAM General:  Alert, well-nourished, well-developed patient in no acute distress Psych:  Mood and affect appropriate for situation CV: Regular rate and rhythm on monitor Respiratory:  Regular, unlabored respirations on room air GI: Abdomen soft and nontender   NEURO:  Mental Status: AA&Ox3, patient is able to give clear and coherent history Speech/Language: speech is without dysarthria or aphasia.  Naming, repetition, fluency, and comprehension intact.  Cranial Nerves:  II: PERRL. Visual fields full.  III, IV, VI: Eyes are disconjugate with left exotropia-she states is baseline V: Sensation is intact to light touch and symmetrical to face.  VII: Face is symmetrical resting and smiling VIII: hearing intact to voice. IX, X: Palate elevates symmetrically. Phonation is normal.  YQ:MVHQIONG shrug 5/5. XII: tongue is midline without fasciculations. Motor: 5/5 strength to all muscle groups tested.  Tone: is normal and bulk is normal Sensation- Intact to light touch bilaterally. Extinction absent to light touch to DSS.   Coordination: Ataxia in right arm and improving. HKS: no ataxia in BLE.No drift.  Gait- deferred  Most Recent NIH  1a Level of Conscious.: 0 1b LOC Questions: 0 1c LOC Commands: 0 2 Best Gaze: 0 3 Visual: 0 4 Facial Palsy: 0 5a Motor Arm - left: 0 5b Motor Arm - Right: 0 6a Motor Leg - Left: 0 6b Motor Leg - Right: 0 7 Limb Ataxia: 1 8 Sensory: 0 9 Best Language: 0 10 Dysarthria: 0 11  Extinct. and Inatten.: 0 TOTAL: 0   ASSESSMENT/PLAN  Ms. Lynsay Fesperman Mongillo is a 68 y.o. female with history of asthma, diabetes, hyperlipidemia, hypertension, irregular heartbeat, migraines, seizures, hypothyroidism, restless leg syndrome admitted for acute onset dizziness, nausea and vomiting.  NIH on Admission 1  Intracerebral Hemorrhage: Right cerebellum-nontraumatic Etiology: Likely hypertensive CT head acute right cerebellum ICH extending into fourth ventricle Repeat CT head stable 2D Echo EF 60-65% LDL 74 HgbA1c 6.7 VTE prophylaxis -SCDs No antithrombotic prior to admission, now on No antithrombotic  Therapy recommendations:  Pending Disposition: Pending   Hypertension Home meds: Amlodipine 5 mg, Un Stable Cleviprex was on for short period of time currently is off Amlodipine restarted Blood Pressure Goal: SBP between 130-150 for 24 hours and then less than 160   Hyperlipidemia Home meds: None  LDL ordered goal < 70   Diabetes type II Controlled Home meds: Trulicity HgbA1c 6.7, goal < 7.0 CBGs SSI Recommend close follow-up with PCP for better DM control   Dysphagia Patient has post-stroke dysphagia, SLP consulted    Diet   Diet Carb Modified Fluid consistency: Thin; Room service appropriate? Yes with Assist   Advance diet as tolerated  UTI  UA positive Start Macrobid today  Other Stroke Risk Factors Obesity, Body mass index is 30.04 kg/m., BMI >/= 30 associated with increased stroke risk, recommend weight loss, diet and exercise as appropriate  Migraines   Other Active Problems Anxiety, will add Hydroxyzine as needed  Hospital day # 2    I, the attending neurologist, have personally obtained a history, examined the patient, evaluated laboratory data, individually viewed imaging studies and agree with radiology interpretations. Together with the NP/PA, we formulated the assessment and plan of care which reflects our mutual decision. She is stable to be  transfer out of the ICU to the floor. I spent a total of 35 minutes dedicated to the care of this patient.    Windell Norfolk, MD Neurology 12/01/2023 2:11 PM    To contact Stroke Continuity provider, please refer to [email or web address]. After hours, contact General Neurology

## 2023-12-01 NOTE — ED Provider Notes (Signed)
 [facility] NEURO/TRAUMA/SURGICAL ICU Provider Note   CSN: [id number] Arrival date & time: 11/29/23  1756     History  Chief Complaint  Patient presents with   Dizziness    Sandra Patrick is a 68 y.o. female.with a history of hypertension, and hypothyroidism who presents to the ED for dizziness. Patient reports that she was in her normal state of health but began to experience severe dizziness with nausea and vomiting around 1800 tonight. Her symptoms have persisted since the onset. She has no prior history of vertigo or similar episodes of dizziness. Denies headaches, neck pain, chest pain, shortness of breath, fevers, chills and recent illness.  No history of trauma or injuries.  She is not on antiplatelet or anticoagulation medications   Dizziness      Home Medications Prior to Admission medications   Medication Sig Start Date End Date Taking? Authorizing Provider  acetaminophen (TYLENOL) 325 MG tablet Take by mouth every 6 (six) hours as needed for headache or mild pain (pain score 1-3).   Yes [provider]  albuterol (PROVENTIL HFA;VENTOLIN HFA) 108 (90 Base) MCG/ACT inhaler Inhale 2 puffs into the lungs every 6 (six) hours as needed for wheezing or shortness of breath. 11/04/16  Yes Amyot, Ali Lowe, NP  amLODipine (NORVASC) 5 MG tablet Take 5 mg by mouth daily.   Yes [provider]  DULoxetine (CYMBALTA) 30 MG capsule Take 30 mg by mouth daily. 11/25/23  Yes [provider]  levothyroxine (SYNTHROID) 125 MCG tablet Take 125 mcg by mouth daily. 11/20/23  Yes [provider]  montelukast (SINGULAIR) 10 MG tablet Take 10 mg by mouth as needed (allergy). 11/25/23  Yes [provider]  rosuvastatin (CRESTOR) 40 MG tablet Take 40 mg by mouth at bedtime. 11/25/23  Yes [provider]  TRULICITY 1.5 MG/0.5ML SOAJ Inject 1.5 mg into the skin once a week. Inject on Thursday 11/26/23  Yes [provider]      Allergies     Gabapentin, Chocolate, Diclofenac, Ceclor [cefaclor], Codeine, Metformin and related, Penicillins, Sulfa antibiotics, and Zithromax [azithromycin]    Review of Systems   Review of Systems  Neurological:  Positive for dizziness.    Physical Exam Updated Vital Signs BP (!) 153/95   Pulse 67   Temp 98.4 F (36.9 C) (Oral)   Resp 16   Ht 5\' 2"  (1.575 m)   Wt 74.5 kg   SpO2 95%   BMI 30.04 kg/m  Physical Exam Vitals and nursing note reviewed.  HENT:     Head: Normocephalic and atraumatic.  Eyes:     Pupils: Pupils are equal, round, and reactive to light.  Cardiovascular:     Rate and Rhythm: Normal rate and regular rhythm.  Pulmonary:     Effort: Pulmonary effort is normal.     Breath sounds: Normal breath sounds.  Abdominal:     Palpations: Abdomen is soft.     Tenderness: There is no abdominal tenderness.  Skin:    General: Skin is warm and dry.  Neurological:     Mental Status: She is alert.     Sensory: No sensory deficit.     Motor: No weakness.     Comments: Horizontal beating nystagmus AAOx4 No motor or sensory deficits  Psychiatric:        Mood and Affect: Mood normal.     ED Results / Procedures / Treatments   Labs (all labs ordered are listed, but only abnormal results are displayed)  Labs Reviewed  COMPREHENSIVE METABOLIC PANEL - Abnormal; Notable for the following components:      Result Value   Glucose, Bld 206 (*)    All other components within normal limits  CBC - Abnormal; Notable for the following components:   WBC 12.6 (*)    All other components within normal limits  URINALYSIS, ROUTINE W REFLEX MICROSCOPIC - Abnormal; Notable for the following components:   APPearance HAZY (*)    Glucose, UA 50 (*)    Ketones, ur 20 (*)    Protein, ur 30 (*)    Nitrite POSITIVE (*)    Leukocytes,Ua LARGE (*)    Bacteria, UA MANY (*)    All other components within normal limits  GLUCOSE, CAPILLARY - Abnormal; Notable for the following components:    Glucose-Capillary 200 (*)    All other components within normal limits  HEMOGLOBIN A1C - Abnormal; Notable for the following components:   Hgb A1c MFr Bld 6.7 (*)    All other components within normal limits  GLUCOSE, CAPILLARY - Abnormal; Notable for the following components:   Glucose-Capillary 161 (*)    All other components within normal limits  GLUCOSE, CAPILLARY - Abnormal; Notable for the following components:   Glucose-Capillary 115 (*)    All other components within normal limits  GLUCOSE, CAPILLARY - Abnormal; Notable for the following components:   Glucose-Capillary 138 (*)    All other components within normal limits  GLUCOSE, CAPILLARY - Abnormal; Notable for the following components:   Glucose-Capillary 120 (*)    All other components within normal limits  GLUCOSE, CAPILLARY - Abnormal; Notable for the following components:   Glucose-Capillary 111 (*)    All other components within normal limits  GLUCOSE, CAPILLARY - Abnormal; Notable for the following components:   Glucose-Capillary 125 (*)    All other components within normal limits  MRSA NEXT GEN BY PCR, NASAL  HIV ANTIBODY (ROUTINE TESTING W REFLEX)  LIPID PANEL  CBG MONITORING, ED    EKG None  Radiology ECHOCARDIOGRAM COMPLETE Result Date: 11/30/2023    ECHOCARDIOGRAM REPORT   Patient Name:   Sandra Patrick Date of Exam: 11/30/2023 Medical Rec #:  [id number]     Height:       62.0 in Accession #:    [id number]    Weight:       164.2 lb Date of Birth:  [birth year]     BSA:          1.758 m Patient Age:    67 years      BP:           127/70 mmHg Patient Gender: F             HR:           68 bpm. Exam Location:  Inpatient Procedure: 2D Echo, Cardiac Doppler and Color Doppler (Both Spectral and Color            Flow Doppler were utilized during procedure).                                 MODIFIED REPORT:   This report was modified by Tessa Lerner on 11/30/2023 due to Need to add the                              conclusion  statement.  Indications:  Stroke I63.9  History:         Patient has prior history of Echocardiogram examinations, most                  recent 07/17/2013. Risk Factors:Hypertension.  Sonographer:     Rosaland Lao Referring Phys:  Tara.Kingdom MCNEILL P KIRKPATRICK Diagnosing Phys: Tessa Lerner IMPRESSIONS  1. Left ventricular ejection fraction, by estimation, is 60 to 65%. The left ventricle has normal function. The left ventricle has no regional wall motion abnormalities. Left ventricular diastolic parameters were normal.  2. Right ventricular systolic function is normal. The right ventricular size is normal. There is normal pulmonary artery systolic pressure.  3. The mitral valve is degenerative. No evidence of mitral valve regurgitation. No evidence of mitral stenosis.  4. The aortic valve was not well visualized. Aortic valve regurgitation is not visualized. No aortic stenosis is present. Comparison(s): No prior Echocardiogram. Conclusion(s)/Recommendation(s): No intracardiac source of embolism detected on this transthoracic study. Consider a transesophageal echocardiogram to exclude cardiac source of embolism if clinically indicated. FINDINGS  Left Ventricle: Left ventricular ejection fraction, by estimation, is 60 to 65%. The left ventricle has normal function. The left ventricle has no regional wall motion abnormalities. Strain imaging was not performed. The left ventricular internal cavity  size was normal in size. There is no left ventricular hypertrophy of the basal-septal segment. Left ventricular diastolic parameters were normal. Right Ventricle: The right ventricular size is normal. No increase in right ventricular wall thickness. Right ventricular systolic function is normal. There is normal pulmonary artery systolic pressure. The tricuspid regurgitant velocity is 2.26 m/s, and  with an assumed right atrial pressure of 3 mmHg, the estimated right ventricular systolic pressure is 23.4 mmHg. Left Atrium:  Left atrial size was normal in size. Right Atrium: Right atrial size was normal in size. Pericardium: There is no evidence of pericardial effusion. Mitral Valve: The mitral valve is degenerative in appearance. Mild mitral annular calcification. No evidence of mitral valve regurgitation. No evidence of mitral valve stenosis. Tricuspid Valve: The tricuspid valve is normal in structure. Tricuspid valve regurgitation is mild . No evidence of tricuspid stenosis. Aortic Valve: The aortic valve was not well visualized. Aortic valve regurgitation is not visualized. No aortic stenosis is present. Pulmonic Valve: The pulmonic valve was grossly normal. Pulmonic valve regurgitation is trivial. No evidence of pulmonic stenosis. Aorta: The aortic root and ascending aorta are structurally normal, with no evidence of dilitation. IAS/Shunts: The atrial septum is grossly normal. Additional Comments: 3D imaging was not performed.  LEFT VENTRICLE PLAX 2D LVIDd:         4.50 cm   Diastology LVIDs:         2.70 cm   LV e' medial:    4.54 cm/s LV PW:         0.80 cm   LV E/e' medial:  12.3 LV IVS:        0.90 cm   LV e' lateral:   4.20 cm/s LVOT diam:     2.10 cm   LV E/e' lateral: 13.3 LV SV:         67 LV SV Index:   38 LVOT Area:     3.46 cm  RIGHT VENTRICLE             IVC RV Basal diam:  2.60 cm     IVC diam: 1.50 cm RV S prime:     12.60 cm/s TAPSE (M-mode): 2.2 cm LEFT ATRIUM  Index        RIGHT ATRIUM           Index LA diam:        4.00 cm 2.28 cm/m   RA Area:     10.90 cm LA Vol (A2C):   41.2 ml 23.43 ml/m  RA Volume:   18.40 ml  10.47 ml/m LA Vol (A4C):   51.2 ml 29.12 ml/m LA Biplane Vol: 46.2 ml 26.28 ml/m  AORTIC VALVE LVOT Vmax:   82.90 cm/s LVOT Vmean:  50.100 cm/s LVOT VTI:    0.193 m  AORTA Ao Root diam: 2.80 cm Ao Asc diam:  3.70 cm MITRAL VALVE               TRICUSPID VALVE MV Area (PHT): 2.83 cm    TR Peak grad:   20.4 mmHg MV Decel Time: 268 msec    TR Vmax:        226.00 cm/s MV E velocity: 55.80  cm/s MV A velocity: 82.10 cm/s  SHUNTS MV E/A ratio:  0.68        Systemic VTI:  0.19 m                            Systemic Diam: 2.10 cm Sunit Tolia Electronically signed by Tessa Lerner Signature Date/Time: 11/30/2023/2:59:23 PM    Final (Updated)    CT HEAD WO CONTRAST ( ) Result Date: 11/30/2023 CLINICAL DATA:  68 year old female with dizziness, right cerebellar hemorrhage. EXAM: CT HEAD WITHOUT CONTRAST TECHNIQUE: Contiguous axial images were obtained from the base of the skull through the vertex without intravenous contrast. RADIATION DOSE REDUCTION: This exam was performed according to the departmental dose-optimization program which includes automated exposure control, adjustment of the mA and/or kV according to patient size and/or use of iterative reconstruction technique. COMPARISON:  Head CT 11/29/2023. FINDINGS: Brain: Right deep cerebellar hyperdense hemorrhage with extension into the 4th ventricle. Estimated intra-axial component is stable, 15 x 18 x 10 mm (AP by transverse by CC), about 1 mL. Unchanged volume of blood in the 4th ventricle and cisterna magna. Basilar cisterns remain patent. No posterior fossa mass effect. No discrete cerebellar edema. Stable gray-white matter differentiation throughout the brain. No ventriculomegaly. No supratentorial mass effect or midline shift. Moderate patchy cerebral white matter and deep gray nuclei hypodense heterogeneity. Vascular: Calcified atherosclerosis at the skull base. No suspicious intracranial vascular hyperdensity. Skull: Intact, No acute osseous abnormality identified. Sinuses/Orbits: Visualized paranasal sinuses and mastoids are stable and well aerated. Other: Stable leftward gaze. Visualized scalp soft tissues are within normal limits. IMPRESSION: 1. Unchanged small volume of hemorrhage in the central right cerebellum, 4th ventricle, cisterna magna. No cerebellar edema, posterior fossa mass effect, or complicating features. 2. No new  intracranial abnormality. Evidence of underlying small vessel disease. Electronically Signed   By: [name] M.D.   On: 11/30/2023 05:55   CT Head Wo Contrast Result Date: 11/29/2023 CLINICAL DATA:  Dizziness EXAM: CT HEAD WITHOUT CONTRAST TECHNIQUE: Contiguous axial images were obtained from the base of the skull through the vertex without intravenous contrast. RADIATION DOSE REDUCTION: This exam was performed according to the departmental dose-optimization program which includes automated exposure control, adjustment of the mA and/or kV according to patient size and/or use of iterative reconstruction technique. COMPARISON:  None Available. FINDINGS: Brain: There is acute intraparenchymal hemorrhage within the dentate nucleus of the right cerebellum, extending into the fourth ventricle. No hydrocephalus. Chronic ischemic  white matter changes. No midline shift or other mass effect. The intraparenchymal hematoma measures approximately 1.4 x 1.1 cm. Vascular: Carotid calcification Skull: Negative Sinuses/Orbits: Paranasal sinuses are clear.  Normal orbits. Other: None IMPRESSION: 1. Acute intraparenchymal hemorrhage within the dentate nucleus of the right cerebellum, extending into the fourth ventricle. This location is most typical of hypertensive hemorrhage. 2. No hydrocephalus. Critical Value/emergent results were called by telephone at the time of interpretation on 11/29/2023 at 9:09 pm to provider [facility] , who verbally acknowledged these results. Electronically Signed   By: [name] M.D.   On: 11/29/2023 21:10    Procedures .Critical Care  Performed by: Royanne Foots, DO Authorized by: Royanne Foots, DO   Critical care provider statement:    Critical care time (minutes):  40   Critical care time was exclusive of:  Separately billable procedures and treating other patients and teaching time   Critical care was necessary to treat or prevent imminent or life-threatening deterioration of the  following conditions:  CNS failure or compromise   Critical care was time spent personally by me on the following activities:  Development of treatment plan with patient or surrogate, discussions with consultants, evaluation of patient's response to treatment, examination of patient, ordering and review of laboratory studies, ordering and review of radiographic studies, ordering and performing treatments and interventions, pulse oximetry, re-evaluation of patient's condition and review of old charts   I assumed direction of critical care for this patient from another provider in my specialty: no     Care discussed with: admitting provider   Comments:     Discussed with Dr Amada Jupiter (neurology)     Medications Ordered in ED Medications  meclizine (ANTIVERT) tablet 25 mg (25 mg Oral Not Given 11/29/23 2136)  clevidipine (CLEVIPREX) infusion 0.5 mg/mL (0 mg/hr Intravenous Stopped 11/30/23 0942)  sodium chloride flush (NS) 0.9 % injection 10-40 mL (10 mLs Intracatheter Given 12/01/23 0940)  sodium chloride flush (NS) 0.9 % injection 10-40 mL (has no administration in time range)  acetaminophen (TYLENOL) tablet 650 mg (650 mg Oral Given 11/30/23 0836)    Or  acetaminophen (TYLENOL) 160 MG/5ML solution 650 mg ( Per Tube See Alternative 11/30/23 0836)    Or  acetaminophen (TYLENOL) suppository 650 mg ( Rectal See Alternative 11/30/23 0836)  senna-docusate (Senokot-S) tablet 1 tablet (1 tablet Oral Given 12/01/23 0939)  pantoprazole (PROTONIX) injection 40 mg (40 mg Intravenous Given 11/30/23 2140)  Oral care mouth rinse (has no administration in time range)  insulin aspart (novoLOG) injection 0-15 Units ( Subcutaneous Not Given 12/01/23 0800)  Chlorhexidine Gluconate Cloth 2 % PADS 6 each (6 each Topical Given 12/01/23 0939)  DULoxetine (CYMBALTA) DR capsule 30 mg (30 mg Oral Given 12/01/23 0939)  levothyroxine (SYNTHROID) tablet 125 mcg (125 mcg Oral Given 12/01/23 0615)  montelukast (SINGULAIR) tablet 10  mg (has no administration in time range)  labetalol (NORMODYNE) injection 10-20 mg (20 mg Intravenous Given 11/30/23 1307)  amLODipine (NORVASC) tablet 5 mg (5 mg Oral Given 12/01/23 0939)  hydrALAZINE (APRESOLINE) injection 10-20 mg (has no administration in time range)  butalbital-acetaminophen-caffeine (FIORICET) 50-325-40 MG per tablet 1 tablet (1 tablet Oral Given 11/30/23 1014)  nitrofurantoin (macrocrystal-monohydrate) (MACROBID) capsule 100 mg (100 mg Oral Given 12/01/23 0939)  ondansetron (ZOFRAN-ODT) disintegrating tablet 4 mg (4 mg Oral Given 11/29/23 1959)   stroke: early stages of recovery book ( Does not apply Given 11/30/23 1016)    ED Course/ Medical Decision Making/ A&P Clinical  Course as of 12/01/23 1204  Fri Nov 29, 2023  2108 Call taken from radiology.  CT head without contrast shows R cerebellum IPH with extension into fourth ventricle.  No midline shift. [MP]  2119 Discussed with Dr. Amada Jupiter (neurology) who will evaluate patient in the ED and admit. Will start cleviprex for BP control given that the patient is hypertensive at this time [MP]    Clinical Course User Index [MP] Royanne Foots, DO                                 Medical Decision Making 68 year old female with history as above presenting given concern for acute onset dizziness with associated nausea and vomiting beginning at 1800 tonight.  No inciting trauma or similar episodes of dizziness in the past.  She is awake alert and oriented with no motor deficits or sensory deficit.  Beating horizontal nystagmus on my exam.  She was seen earlier tonight in triage where a CT head without contrast was ordered.  Prior to meeting her, I received a call from radiology to inform me of the intraparenchymal hemorrhage.  I then evaluated her in her ED room and reached out to neurology.  Dr. Amada Jupiter from neurology immediately evaluated the patient in the department and has ordered Cleviprex to maintain blood pressure  control.  No plan for emergent neurosurgical intervention at this time but she will be admitted to the ICU for frequent neurochecks and close monitoring.  No anticoagulation to reverse.  Risk Decision regarding hospitalization.           Final Clinical Impression(s) / ED Diagnoses Final diagnoses:  Intraparenchymal hemorrhage of brain [facility])    Rx / DC Orders ED Discharge Orders     None         Royanne Foots, DO 12/01/23 1205

## 2023-12-01 NOTE — Care Management (Signed)
 Discussed with patient, her daughter, Aggie Cosier, and her friend/roommate Darl Pikes about discharge planning. They have all decided they would like Darl Pikes to be the main contact for discharge planning as she is local and is an Charity fundraiser at a rehab facility. The goal is to eventually move the patient, after rehab needs, out to [facility] to be closer to her Daughter.

## 2023-12-01 NOTE — Progress Notes (Signed)
 Physical Therapy Treatment Patient Details Name: Sandra Patrick MRN: [id number] DOB: 17-Jun-1956 Today's Date: 12/01/2023   History of Present Illness 68 y.o. female admitted on 11/29/23 for acute onset dizziness and HA.  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  Pt with significant PMH of DM, HTN, seizure.    PT Comments  Pt was agreeable to walking in the hallway and required min to contact guard assist with RW.  She did not use a RW before and would like to progress back to no assistive device if able.  D/c plan changes every time I visit her.  She is thinking she now wants to go to reahb here (SNF) before going to be with her daughter in [place].  Daughter was here earlier today and I spoke with her.  She just started a new job and is afraid to ask to be off to take care of her mom right now.  She is afraid she will lose her job.  She would like for her mom to rehab here before she comes back home to [facility] (so she will be more independent and safe to be home alone while the daughter is at work).  Pt would benefit from some balance exercises next session.  PT will continue to follow acutely for safe mobility progression.   If plan is discharge home, recommend the following: A little help with walking and/or transfers;A little help with bathing/dressing/bathroom;Assistance with cooking/housework;Assist for transportation;Help with stairs or ramp for entrance   Can travel by private vehicle        Equipment Recommendations  Rolling walker (2 wheels)    Recommendations for Other Services       Precautions / Restrictions Precautions Precautions: Fall Precaution/Restrictions Comments: pt lists to the right when not using a RW     Mobility  Bed Mobility Overal bed mobility: Modified Independent             General bed mobility comments: slower, but can do on her own from flat bed    Transfers Overall transfer level: Needs assistance Equipment used:  Rolling walker (2 wheels) Transfers: Sit to/from Stand Sit to Stand: Min assist           General transfer comment: Min assist to come to standing, cues for safe hand placement during transitions.    Ambulation/Gait Ambulation/Gait assistance: Contact guard assist Gait Distance (Feet): 130 Feet Assistive device: Rolling walker (2 wheels) Gait Pattern/deviations: Step-through pattern, Staggering right Gait velocity: decreased Gait velocity interpretation: <1.8 ft/sec, indicate of risk for recurrent falls   General Gait Details: staggering and listing to the right requiring contact guard assist.  3-4 standing rest breaks, pt reporting she is tired.   Stairs             Wheelchair Mobility     Tilt Bed    Modified Rankin (Stroke Patients Only) Modified Rankin (Stroke Patients Only) Pre-Morbid Rankin Score: No symptoms Modified Rankin: Moderately severe disability     Balance Overall balance assessment: Needs assistance Sitting-balance support: Feet supported, No upper extremity supported Sitting balance-Leahy Scale: Good Sitting balance - Comments: supervision EOB   Standing balance support: Single extremity supported, Bilateral upper extremity supported Standing balance-Leahy Scale: Poor Standing balance comment: needs external support in standing.                            Communication Communication Communication: No apparent difficulties  Cognition  Arousal: Alert Behavior During Therapy: WFL for tasks assessed/performed (joking more and smiling more today)                           PT - Cognition Comments: see SLP notes, some impairments, but unsure if she is not close to her normal baseline. Following commands: Intact      Cueing    Exercises      General Comments        Pertinent Vitals/Pain Pain Assessment Pain Assessment: No/denies pain    Home Living     Available Help at Discharge: Family;Available  PRN/intermittently Type of Home: House                  Prior Function            PT Goals (current goals can now be found in the care plan section) Progress towards PT goals: Progressing toward goals    Frequency    Min 1X/week      PT Plan      Co-evaluation              AM-PAC PT "6 Clicks" Mobility   Outcome Measure  Help needed turning from your back to your side while in a flat bed without using bedrails?: None Help needed moving from lying on your back to sitting on the side of a flat bed without using bedrails?: None Help needed moving to and from a bed to a chair (including a wheelchair)?: A Little Help needed standing up from a chair using your arms (e.g., wheelchair or bedside chair)?: A Little Help needed to walk in hospital room?: A Little Help needed climbing 3-5 steps with a railing? : A Lot 6 Click Score: 19    End of Session Equipment Utilized During Treatment: Gait belt Activity Tolerance: Patient limited by fatigue Patient left: in bed;with call bell/phone within reach;with bed alarm set   PT Visit Diagnosis: Difficulty in walking, not elsewhere classified (R26.2);Unsteadiness on feet (R26.81)     Time: 1610-9604 PT Time Calculation (min) (ACUTE ONLY): 28 min  Charges:    $Gait Training: 8-22 mins $Therapeutic Activity: 8-22 mins PT General Charges $$ ACUTE PT VISIT: 1 Visit                     Corinna Capra, PT, DPT  Acute Rehabilitation Secure chat is best for contact #[phone] office    12/01/2023, 3:16 PM

## 2023-12-01 NOTE — Evaluation (Signed)
 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   Chronic right-sided headache 12/06/2014   Bilateral low back pain without sciatica 05/20/2014   Left hand weakness 12/25/2013   Near syncope 06/25/2013   Hypothyroidism 05/28/2013   Arthritis 05/28/2013   Anxiety state 03/13/2013   HTN (hypertension) 03/13/2013   Past Medical History:  Past Medical History:  Diagnosis Date   Asthma    Diabetes mellitus without complication (HCC)    Headache    Hyperlipemia    Hypertension    Irregular heart beat    Migraine    Restless leg syndrome    Seizure (HCC)    Thyroid disease    Past Surgical History:  Past Surgical History:  Procedure Laterality Date   ABDOMINAL HYSTERECTOMY     ADENOIDECTOMY     CARPAL TUNNEL RELEASE     CESAREAN SECTION     EYE SURGERY     TONSILLECTOMY     HPI:  Patient is a 68 y.o. female with PMH: DM, HTN, self-reported h/o "brain bleed" 2-3 years ago from a fall, RLS, depression, anxiety. She presented to the hospital on 11/29/2023 for acute onset of dizziness and headache. CT head revealed a small volume of hemorrhage in the central right cerebellum.   Assessment / Plan / Recommendation Clinical Impression  Patient presents with a questionable, mild cognitive-linguistic impairment and a mild dysarthria. Of note, patient appeared anxious and restless (she denied h/o anxiety but SLP did note anxiety and depression in her PMH during chart review). She would perseverate on topics that seemed to be  on her mind as they likely are causing her stress, such as plan to eventually move to [place], [place] with her daughter. She spoke in a monotone voice throughout session. Speech was rapid at times and if SLP not looking directly at her, conversation-level intelligiblity was less than 75%. She expressed some worry about transferring her medical insurance, worry about becoming dizzy and nauseated during the car ride to [facility]. She described her stroke symptoms as dizziness which occured while she and a friend were going to a restaurant. This was followed by her becoming nauseated and vomiting. She was fully oriented to time, place, situation and able to recall recent medical and therapeutic interventions. When SLP asked her about reason she declined to walk in the hall yesterday during PT evaluation, she adamantly denied declining this and said she had not been given the option. SLP plans to f/u at least one more time to ensure that patient is functioning at baseline.    SLP Assessment  SLP Recommendation/Assessment: Patient needs continued Speech Lanaguage Pathology Services SLP Visit Diagnosis: Cognitive communication deficit (R41.841);Dysarthria and anarthria (R47.1)    Recommendations for follow up therapy are one component of a multi-disciplinary discharge planning process, led by the attending physician.  Recommendations may be updated based on patient status, additional functional criteria and insurance authorization.    Follow Up Recommendations  Other (comment) (TBD)    Assistance Recommended at Discharge  PRN  Functional Status Assessment Patient has had a recent  decline in their functional status and demonstrates the ability to make significant improvements in function in a reasonable and predictable amount of time.  Frequency and Duration min 1 x/week  1 week      SLP Evaluation Cognition  Overall Cognitive Status: Difficult to assess Arousal/Alertness: Awake/alert Orientation Level:  Oriented X4 Attention: Sustained;Selective Sustained Attention: Impaired Sustained Attention Impairment: Verbal complex Selective Attention: Impaired Selective Attention Impairment: Verbal complex;Verbal basic Memory: Appears intact Awareness: Appears intact Problem Solving: Appears intact Behaviors: Restless;Perseveration Safety/Judgment: Appears intact       Comprehension  Auditory Comprehension Overall Auditory Comprehension: Appears within functional limits for tasks assessed    Expression Expression Primary Mode of Expression: Verbal Verbal Expression Overall Verbal Expression: Appears within functional limits for tasks assessed Repetition: No impairment Naming: No impairment Pragmatics: Impairment Impairments: Abnormal affect;Topic maintenance Interfering Components: Attention   Oral / Motor  Oral Motor/Sensory Function Overall Oral Motor/Sensory Function: Mild impairment Facial ROM: Within Functional Limits Facial Symmetry: Within Functional Limits Facial Strength: Within Functional Limits Facial Sensation: Within Functional Limits Lingual ROM: Within Functional Limits Lingual Symmetry: Within Functional Limits Lingual Strength: Within Functional Limits Motor Speech Overall Motor Speech: Impaired Respiration: Within functional limits Phonation: Normal Resonance: Within functional limits Articulation: Impaired Level of Impairment: Conversation Intelligibility: Intelligibility reduced Word: 75-100% accurate Phrase: 75-100% accurate Sentence: 50-74% accurate Conversation: 50-74% accurate Motor Planning: Witnin functional limits Motor Speech Errors: Not applicable Interfering Components: Premorbid status           Angela Nevin, MA, CCC-SLP Speech Therapy

## 2023-12-01 NOTE — Progress Notes (Signed)
 PT Cancellation Note  Patient Details Name: Sandra Patrick MRN: [id number] DOB: 05-10-1956   Cancelled Treatment:    Reason Eval/Treat Not Completed: Other (comment);Fatigue/lethargy limiting ability to participate.  Checked on pt twice today.  First time she was eating and I wanted her to have the chance to eat her lunch while it was warm (she was up in the chair), second time (now) she is back in the bed and feeling sleepy, not wanting to get up.  I told her I will check back around 1500 to see if we can get up and walk.  I spoke at length earlier with pt and her daughter.  Original plan was that pt wanted to go home with daughter to [facility], now daughter and pt would like to pursue post acute therapy at a SNF in [place] (they could not remember the name).  Thanks,  Corinna Capra, PT, DPT  Acute Rehabilitation Secure chat is best for contact #[phone] office      Dimas Aguas 12/01/2023, 12:52 PM

## 2023-12-02 DIAGNOSIS — E119 Type 2 diabetes mellitus without complications: Secondary | ICD-10-CM

## 2023-12-02 DIAGNOSIS — N390 Urinary tract infection, site not specified: Secondary | ICD-10-CM | POA: Insufficient documentation

## 2023-12-02 DIAGNOSIS — I639 Cerebral infarction, unspecified: Secondary | ICD-10-CM

## 2023-12-02 DIAGNOSIS — E669 Obesity, unspecified: Secondary | ICD-10-CM | POA: Insufficient documentation

## 2023-12-02 DIAGNOSIS — R1310 Dysphagia, unspecified: Secondary | ICD-10-CM

## 2023-12-02 DIAGNOSIS — E785 Hyperlipidemia, unspecified: Secondary | ICD-10-CM | POA: Insufficient documentation

## 2023-12-02 LAB — GLUCOSE, CAPILLARY
Glucose-Capillary: 112 mg/dL — ABNORMAL HIGH (ref 70–99)
Glucose-Capillary: 119 mg/dL — ABNORMAL HIGH (ref 70–99)
Glucose-Capillary: 110 mg/dL — ABNORMAL HIGH (ref 70–99)
Glucose-Capillary: 105 mg/dL — ABNORMAL HIGH (ref 70–99)

## 2023-12-02 MED ORDER — ENOXAPARIN SODIUM 40 MG/0.4ML IJ SOSY
40.0000 mg | PREFILLED_SYRINGE | Freq: Every day | INTRAMUSCULAR | Status: DC
  Administered 2023-12-02 – 2023-12-04 (×3): 40 mg via SUBCUTANEOUS
  Filled 2023-12-02 (×3): qty 0.4

## 2023-12-02 MED ORDER — PANTOPRAZOLE SODIUM 40 MG PO TBEC: 40.0000 mg | DELAYED_RELEASE_TABLET | Freq: Every day | ORAL | Status: DC

## 2023-12-02 NOTE — Evaluation (Signed)
 Occupational Therapy Evaluation Patient Details Name: Sandra Patrick MRN: [id number] DOB: 10/14/1955 Today's Date: 12/02/2023   History of Present Illness   68 y.o. female admitted on 11/29/23 for acute onset dizziness and HA.  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  Pt with significant PMH of DM, HTN, seizure.     Clinical Impressions PTA, pt was mod I for ADL and IADL; does not typically drive, but can per her report. Upon eval, pt with generalized weakness and decreased activity tolerance; fair coordination during ADL but minor difficulty on R during more formalized assessment with greater deficits in LE than UE noted during functional tasks. Pt with fair awareness. Pt grossly needing CGA. Pt plans to go to rehab <3 hours/day prior to transitioning to her daughter's home in [place]. Will continue to follow.    If plan is discharge home, recommend the following:   A little help with walking and/or transfers;A little help with bathing/dressing/bathroom;Assistance with cooking/housework;Direct supervision/assist for financial management;Direct supervision/assist for medications management;Assist for transportation;Help with stairs or ramp for entrance     Functional Status Assessment   Patient has had a recent decline in their functional status and demonstrates the ability to make significant improvements in function in a reasonable and predictable amount of time.     Equipment Recommendations   Other (comment) (defer to next venue of care)     Recommendations for Other Services         Precautions/Restrictions   Precautions Precautions: Fall Restrictions Weight Bearing Restrictions Per Provider Order: No     Mobility Bed Mobility Overal bed mobility: Modified Independent                  Transfers Overall transfer level: Needs assistance Equipment used: Rolling walker (2 wheels) Transfers: Sit to/from Stand Sit to  Stand: Contact guard assist           General transfer comment: min cues for safety and hand placement      Balance Overall balance assessment: Needs assistance Sitting-balance support: Feet supported, No upper extremity supported Sitting balance-Leahy Scale: Good Sitting balance - Comments: supervision EOB   Standing balance support: Single extremity supported, Bilateral upper extremity supported Standing balance-Leahy Scale: Poor Standing balance comment: reliant on device                           ADL either performed or assessed with clinical judgement   ADL Overall ADL's : Needs assistance/impaired     Grooming: Oral care;Contact guard assist;Standing Grooming Details (indicate cue type and reason): mildly off balance but no hands on A needed; close guarding for safety Upper Body Bathing: Set up;Sitting   Lower Body Bathing: Contact guard assist;Sit to/from stand   Upper Body Dressing : Set up;Sitting   Lower Body Dressing: Contact guard assist;Sit to/from stand   Toilet Transfer: Contact guard assist;Rolling walker (2 wheels);Ambulation           Functional mobility during ADLs: Contact guard assist;Rolling walker (2 wheels)       Vision Baseline Vision/History: 1 Wears glasses Ability to See in Adequate Light: 0 Adequate Patient Visual Report: No change from baseline Additional Comments: Able to read from phone and signage on wall. L eye deviated at baseline per report     Perception         Praxis         Pertinent Vitals/Pain Pain Assessment Pain Assessment:  No/denies pain     Extremity/Trunk Assessment Upper Extremity Assessment Upper Extremity Assessment: Generalized weakness;Right hand dominant;RUE deficits/detail (mildly decr coordination with reaching with RUE) RUE Deficits / Details: mildly decr coorindation with RUE reaching   Lower Extremity Assessment Lower Extremity Assessment: Defer to PT evaluation        Communication Communication Communication: No apparent difficulties   Cognition Arousal: Alert Behavior During Therapy: WFL for tasks assessed/performed Cognition: No family/caregiver present to determine baseline, Cognition impaired     Awareness: Intellectual awareness intact Memory impairment (select all impairments): Short-term memory (asking same questions multiple times) Attention impairment (select first level of impairment): Selective attention Executive functioning impairment (select all impairments): Problem solving OT - Cognition Comments: cues for safety with RW use.                 Following commands: Intact (1-2 step commands)       Cueing  General Comments   Cueing Techniques: Verbal cues      Exercises     Shoulder Instructions      Home Living Family/patient expects to be discharged to:: Private residence Living Arrangements: Non-relatives/Friends Available Help at Discharge: Family;Available PRN/intermittently Type of Home: House Home Access: Stairs to enter [employer] of Steps: 5 Entrance Stairs-Rails: Right Home Layout: One level     Bathroom Shower/Tub: Chief Strategy Officer: Standard     Home Equipment: None   Additional Comments: pt reports she can drive, but does not, and 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 She says she is going home with her daughter when she gets here from [facility]. home set up provided above consistent with home set-up provided regarding daiughter house      Prior Functioning/Environment Prior Level of Function : Independent/Modified Independent               ADLs Comments: rides with friend to grocery store    OT Problem List: Decreased strength;Decreased activity tolerance;Impaired balance (sitting and/or standing);Decreased cognition;Decreased safety awareness   OT Treatment/Interventions: Self-care/ADL training;Therapeutic exercise;DME and/or AE  instruction;Balance training;Patient/family education;Therapeutic activities      OT Goals(Current goals can be found in the care plan section)   Acute Rehab OT Goals Patient Stated Goal: get better OT Goal Formulation: With patient Time For Goal Achievement: 12/16/23 Potential to Achieve Goals: Good   OT Frequency:  Min 1X/week    Co-evaluation              AM-PAC OT "6 Clicks" Daily Activity     Outcome Measure Help from another person eating meals?: None Help from another person taking care of personal grooming?: A Little Help from another person toileting, which includes using toliet, bedpan, or urinal?: A Little Help from another person bathing (including washing, rinsing, drying)?: A Little Help from another person to put on and taking off regular upper body clothing?: A Little Help from another person to put on and taking off regular lower body clothing?: A Little 6 Click Score: 19   End of Session Equipment Utilized During Treatment: Rolling walker (2 wheels);Gait belt Nurse Communication: Mobility status  Activity Tolerance: Patient tolerated treatment well Patient left: in bed;with call bell/phone within reach;with bed alarm set  OT Visit Diagnosis: Unsteadiness on feet (R26.81);Muscle weakness (generalized) (M62.81);Other symptoms and signs involving cognitive function                Time: 4010-2725 OT Time Calculation (min): 27 min Charges:  OT General  Charges $OT Visit: 1 Visit OT Evaluation $OT Eval Moderate Complexity: 1 Mod OT Treatments $Self Care/Home Management : 8-22 mins  Tyler Deis, OTR/L [facility] Acute Rehabilitation Office: [phone]   Myrla Halsted 12/02/2023, 1:02 PM

## 2023-12-02 NOTE — TOC Initial Note (Addendum)
 Transition of Care [facility]) - Initial/Assessment Note    Patient Details  Name: Sandra Patrick MRN: [id number] Date of Birth: 04/14/1956  [facility] of [facility]) CM/SW Contact:    Mearl Latin, LCSW Phone Number: 12/02/2023, 12:12 PM  Clinical Narrative:                 12pm-CSW received consult for possible SNF placement at time of discharge. CSW spoke with patient. Patient reported that she would like to go to rehab at [facility] SNF prior to going to stay with her daughter in [place]. CSW discussed insurance authorization process and will provide Medicare SNF ratings list. CSW will send out referrals for review and provide bed offers as available.   4:23 PM-[facility] responded and is able to accept patient pending insurance authorization. CSW contacted [facility] as patient did not show up in their system and they reported patient is managed by regular [employer]. CSW requested SNF begin insurance process with [employer]. CSW provided update to patient's daughter. Daughter stated she will continue speaking with patient if patient decides to return home with daughter in [place] but will await insurance determination on SNF in the meantime. Daughter would not be able to come get her until this weekend if she has to come home. Daughter has already schedule a PCP appointment at [facility] for patient to see.   Skilled Nursing Rehab Facilities-   [email or web address]   Ratings out of 5 stars (5 the highest)   Name Address  Phone # Quality Care Staffing Health Inspection Overall  [facility] [address] [phone] 3 3 4 4   [facility] [address], [place] [phone] 5 1 4 4   [facility] [address], [facility] [phone] 2 2 2 2   [facility] [address], [place] [phone] 5 2 4 5   [facility]  [address], [place] [phone] 4 3 5 5   [facility] [address] [facility] [phone] 4 2 2 2    [facility] [address] [place] [phone] 5 1 2 2   [facility] [address] [place] [phone] 2 1 3 2   [address] ([facility]) [address] [place] [phone] 2 3 3 3   [facility] [address] [place], [place] [phone] 3 3 2 2   [facility] ([place]) [address], [place] [phone] 3 1 1 1   [facility] [address] Liliane Shi [phone] 2 3 4 4   [facility] [address], [place] [phone] 4 4 3 3   [facility] ([facility]) [address], [place] [phone] 1 2 4 3           [facility] [address] [place] [phone] 2 1 4 3   [facility] [address], [place] [phone] 4 2 1 1   [facility]  [address], [place], [place] [address] [phone] 2 2 2 2   [facility] [place] [address] [phone] 3 2 4 4   [facility] [address], [place] [phone] 2 1 2 1   [facility] (No [employer]) [address], [place] [phone] 5 4 5 5   [facility] [address], [facility] [phone] 3 4 2 2   [facility] [address] [place] [phone] 2 1 1 1   [facility] [address] Liliane Shi [phone] 4 2 5 5   [facility] [address], [place] [phone] 4 1 1 1   [facility] [address],  [place] [phone] 2 2 3 3           [facility]  [phone] 3 1 1 1   [facility] [address], Evlyn Clines  [phone] 3 3 3 3   [facility] (No [employer]) [address], [place] [phone] 2 2 4 4   [facility]) [address], [place]al[place] Levan [phone] 2 1 1 1   [facility] [address] [place] Levan [phone] 4 3 5 5   [facility] [address], [place] [phone] 1 1 1 1           [facility] [address] [place], [place] [phone] 5 4 5 5   [facility])  [address],  [place] [phone] 1 1 2 1   [facility]) [address] [place] [phone]  2 4 4   [facility] [address] [place] [phone] 3 5 5 5   [address] [place], [place] [phone] 4 2 2 2   [facility]) [address] [place] [phone] 2 1 3 2      Expected Discharge Plan: Skilled Nursing Facility Barriers to Discharge: Insurance Authorization, Continued Medical Work up, SNF Pending bed offer   Patient Goals and CMS Choice Patient states their goals for this hospitalization and ongoing recovery are:: Rehab CMS Medicare.gov Compare Post Acute Care list provided to:: Patient Choice offered to / list presented to : Patient [place] ownership interest in [facility].provided to:: Patient    Expected Discharge Plan and Services In-house Referral: Clinical Social Work   Post Acute Care Choice: Skilled Nursing Facility Living arrangements for the past 2 months: Mobile Home                                      Prior Living Arrangements/Services Living arrangements for the past 2 months: Mobile Home Lives with:: Roommate Patient language and need for interpreter reviewed:: Yes Do you feel safe going back to the place where you live?: Yes      Need for Family Participation in Patient Care: Yes (Comment) Care giver support system in place?: Yes (comment)   Criminal Activity/Legal Involvement Pertinent to Current Situation/Hospitalization: No - Comment as needed  Activities of Daily Living   ADL Screening (condition at time of admission) Independently performs ADLs?: Yes (appropriate for developmental age) Is the patient deaf or have difficulty hearing?: No Does the patient have difficulty seeing, even when wearing glasses/contacts?: No Does the patient have difficulty concentrating, remembering, or making decisions?: No  Permission Sought/Granted Permission sought to share  information with : Facility Industrial/product designer granted to share information with : Yes, Verbal Permission Granted     Permission granted to share info w AGENCY: SNFS        Emotional Assessment Appearance:: Appears stated age Attitude/Demeanor/Rapport: Engaged Affect (typically observed): Accepting Orientation: : Oriented to Self, Oriented to Place, Oriented to  Time, Oriented to Situation Alcohol / Substance Use: Not Applicable Psych Involvement: No (comment)  Admission diagnosis:  Stroke (cerebrum) [facility]) [I63.9] Patient Active Problem List   Diagnosis Date Noted   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 Pharmacy:   [facility] [address] [place] - [address] [place] [address] Phone: [phone] Fax: [phone]  [facility] - [facility] [address] [place] [address] Phone: [phone] Fax: [phone]  [facility] [phone] - [place], [place] - [place] [address] AT [facility] [address] [facility] [address] [place] [address] Phone: [phone] Fax: [phone]     Social Drivers of Health (SDOH) Social History: SDOH Screenings   Food Insecurity: No Food Insecurity (11/29/2023)  Housing: Low Risk  (11/29/2023)  Transportation Needs: No Transportation Needs (11/29/2023)  Utilities: Not At Risk  (11/29/2023)  Social Connections: Socially Isolated (11/29/2023)  Tobacco Use: Low Risk  (11/29/2023)   SDOH Interventions:     Readmission Risk Interventions     No data to display

## 2023-12-02 NOTE — Progress Notes (Addendum)
 STROKE TEAM PROGRESS NOTE    SIGNIFICANT HOSPITAL EVENTS 2/21 scented with acute onset of vertigo, nausea and vomiting.  CT head with right cerebellum ICH-admitted to ICU  INTERIM HISTORY/SUBJECTIVE  No family at bedside.  She is doing well no complaints.  Sitting up eating lunch.  Start DVT prophy today  Started on Macrobid for UTI.    Cleviprex stopped this morning.  OBJECTIVE  CBC    Component Value Date/Time   WBC 12.6 (H) 11/29/2023 2024   RBC 4.98 11/29/2023 2024   HGB 14.9 11/29/2023 2024   HCT 45.9 11/29/2023 2024   PLT 215 11/29/2023 2024   MCV 92.2 11/29/2023 2024   MCH 29.9 11/29/2023 2024   MCHC 32.5 11/29/2023 2024   RDW 13.8 11/29/2023 2024   LYMPHSABS 2.2 01/03/2017 1834   MONOABS 0.9 01/03/2017 1834   EOSABS 0.1 01/03/2017 1834   BASOSABS 0.0 01/03/2017 1834    BMET    Component Value Date/Time   NA 142 11/29/2023 2024   K 3.8 11/29/2023 2024   CL 106 11/29/2023 2024   CO2 22 11/29/2023 2024   GLUCOSE 206 (H) 11/29/2023 2024   BUN 18 11/29/2023 2024   CREATININE 0.81 11/29/2023 2024   CREATININE 0.81 12/16/2013 1018   CALCIUM 9.5 11/29/2023 2024   GFRNONAA >60 11/29/2023 2024   GFRNONAA 82 12/16/2013 1018    IMAGING past 24 hours No results found.   Vitals:   12/02/23 0759 12/02/23 0800 12/02/23 0900 12/02/23 1000  BP:  (!) 150/80 (!) 153/79 (!) 151/99  Pulse:  (!) 59 (!) 56 61  Resp:  10 13 14   Temp: 98.2 F (36.8 C)     TempSrc: Oral     SpO2:  94% 93% 94%  Weight:      Height:         PHYSICAL EXAM General:  Alert, well-nourished, well-developed patient in no acute distress Psych:  Mood and affect appropriate for situation CV: Regular rate and rhythm on monitor Respiratory:  Regular, unlabored respirations on room air   NEURO:  Mental Status: AA&Ox3, patient is able to give clear and coherent history Speech/Language: speech is without dysarthria or aphasia.  Naming, repetition, fluency, and comprehension  intact.  Cranial Nerves:  II: PERRL. Visual fields full.  III, IV, VI: Eyes are disconjugate with left exotropia-she states is baseline V: Sensation is intact to light touch and symmetrical to face.  VII: Face is symmetrical resting and smiling VIII: hearing intact to voice. IX, X: Palate elevates symmetrically. Phonation is normal.  ZO:[id number] shrug 5/5. XII: tongue is midline without fasciculations. Motor: 5/5 strength to all muscle groups tested.  Tone: is normal and bulk is normal Sensation- Intact to light touch bilaterally. Extinction absent to light touch to DSS.   Coordination: Ataxia in right arm and improving. HKS: no ataxia in BLE.No drift.  Gait- deferred    ASSESSMENT/PLAN  Ms. Ainsley Deakins Renninger is a 68 y.o. female with history of asthma, diabetes, hyperlipidemia, hypertension, irregular heartbeat, migraines, seizures, hypothyroidism, restless leg syndrome admitted for acute onset dizziness, nausea and vomiting.  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 HgbA1c 6.7 VTE prophylaxis -SCDs No antithrombotic prior to admission, now on No antithrombotic  Therapy recommendations:  Pending Disposition: Pending   Hypertension Home meds: Amlodipine 5 mg, Un Stable Cleviprex was on for  short period of time currently is off Amlodipine restarted will increase to 10 mg today. Blood Pressure Goal: SBP between 130-150 for 24 hours and then less than 160   Hyperlipidemia Home meds: None LDL ordered goal < 70   Diabetes type II Controlled Home meds: Trulicity HgbA1c 6.7, goal < 7.0 CBGs SSI Recommend close follow-up with PCP for better DM control   Dysphagia Patient has post-stroke dysphagia, SLP consulted    Diet   Diet Carb Modified Fluid consistency: Thin; Room  service appropriate? Yes with Assist   Advance diet as tolerated  UTI  UA positive Start Macrobid today  Other Stroke Risk Factors Obesity Class 1:  Body mass index is 30.04 kg/m., BMI >/= 30 associated with increased stroke risk, recommend weight loss, diet and exercise as appropriate  Migraines   Other Active Problems Anxiety, will add Hydroxyzine as needed  Hospital day # 3  Her exam is nonfocal with NIH stroke scale of 0. Start DVT prophylaxis.  Increase amlodipine to 10 mg today.  Transfer out of the ICU today.   Total of 35 mins spent reviewing chart, discussion with patient and family on prognosis, Dx and plan. Discussed case with patient's nurse. Reviewed Imaging personally.   To contact Stroke Continuity provider, please refer to [email or web address]. After hours, contact General Neurology

## 2023-12-02 NOTE — Plan of Care (Signed)
  Problem: Education: Goal: Knowledge of disease or condition will improve Outcome: Progressing Goal: Knowledge of secondary prevention will improve (MUST DOCUMENT ALL) Outcome: Progressing Goal: Knowledge of patient specific risk factors will improve (DELETE if not current risk factor) Outcome: Progressing   Problem: Intracerebral Hemorrhage Tissue Perfusion: Goal: Complications of Intracerebral Hemorrhage will be minimized Outcome: Progressing   Problem: Coping: Goal: Will verbalize positive feelings about self Outcome: Progressing Goal: Will identify appropriate support needs Outcome: Progressing   Problem: Health Behavior/Discharge Planning: Goal: Ability to manage health-related needs will improve Outcome: Progressing Goal: Goals will be collaboratively established with patient/family Outcome: Progressing   Problem: Self-Care: Goal: Ability to participate in self-care as condition permits will improve Outcome: Progressing Goal: Verbalization of feelings and concerns over difficulty with self-care will improve Outcome: Progressing Goal: Ability to communicate needs accurately will improve Outcome: Progressing   Problem: Nutrition: Goal: Risk of aspiration will decrease Outcome: Progressing Goal: Dietary intake will improve Outcome: Progressing   Problem: Education: Goal: Knowledge of General Education information will improve Description: Including pain rating scale, medication(s)/side effects and non-pharmacologic comfort measures Outcome: Progressing   Problem: Health Behavior/Discharge Planning: Goal: Ability to manage health-related needs will improve Outcome: Progressing   Problem: Clinical Measurements: Goal: Ability to maintain clinical measurements within normal limits will improve Outcome: Progressing Goal: Will remain free from infection Outcome: Progressing Goal: Diagnostic test results will improve Outcome: Progressing Goal: Respiratory  complications will improve Outcome: Progressing Goal: Cardiovascular complication will be avoided Outcome: Progressing   Problem: Activity: Goal: Risk for activity intolerance will decrease Outcome: Progressing   Problem: Nutrition: Goal: Adequate nutrition will be maintained Outcome: Progressing   Problem: Coping: Goal: Level of anxiety will decrease Outcome: Progressing   Problem: Elimination: Goal: Will not experience complications related to bowel motility Outcome: Progressing Goal: Will not experience complications related to urinary retention Outcome: Progressing   Problem: Pain Managment: Goal: General experience of comfort will improve and/or be controlled Outcome: Progressing   Problem: Safety: Goal: Ability to remain free from injury will improve Outcome: Progressing   Problem: Skin Integrity: Goal: Risk for impaired skin integrity will decrease Outcome: Progressing   Problem: Education: Goal: Ability to describe self-care measures that may prevent or decrease complications (Diabetes Survival Skills Education) will improve Outcome: Progressing Goal: Individualized Educational Video(s) Outcome: Progressing   Problem: Coping: Goal: Ability to adjust to condition or change in health will improve Outcome: Progressing   Problem: Fluid Volume: Goal: Ability to maintain a balanced intake and output will improve Outcome: Progressing   Problem: Health Behavior/Discharge Planning: Goal: Ability to identify and utilize available resources and services will improve Outcome: Progressing Goal: Ability to manage health-related needs will improve Outcome: Progressing   Problem: Metabolic: Goal: Ability to maintain appropriate glucose levels will improve Outcome: Progressing   Problem: Nutritional: Goal: Maintenance of adequate nutrition will improve Outcome: Progressing Goal: Progress toward achieving an optimal weight will improve Outcome: Progressing    Problem: Skin Integrity: Goal: Risk for impaired skin integrity will decrease Outcome: Progressing   Problem: Tissue Perfusion: Goal: Adequacy of tissue perfusion will improve Outcome: Progressing

## 2023-12-02 NOTE — NC FL2 (Signed)
 [place] MEDICAID FL2 LEVEL OF CARE FORM     IDENTIFICATION  Patient Name: Sandra Patrick Birthdate: 1955-11-18 Sex: female Admission Date (Current Location): 11/29/2023  [facility] and [place] Number:  Producer, television/film/video and Address:  [facility] [place], 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   Snoring 03/03/2015   Excessive daytime sleepiness 03/03/2015   Nocturnal headaches 03/03/2015   RLS (restless legs syndrome) 03/03/2015   Vitamin D deficiency 12/07/2014   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   Chronic right-sided headache 12/06/2014   Bilateral low back pain without sciatica 05/20/2014   Left hand weakness 12/25/2013   Near syncope 06/25/2013   Hypothyroidism 05/28/2013   Arthritis 05/28/2013   Anxiety state 03/13/2013   HTN (hypertension) 03/13/2013    Orientation RESPIRATION BLADDER Height & Weight     Self, Time, Situation, Place  Normal Continent Weight: 164 lb 3.9 oz (74.5 kg) Height:  5\' 2"  (157.5 cm)  BEHAVIORAL SYMPTOMS/MOOD NEUROLOGICAL BOWEL NUTRITION STATUS      Continent Diet (See dc summary)  AMBULATORY STATUS COMMUNICATION OF NEEDS Skin   Limited Assist Verbally Normal                       Personal Care Assistance Level of Assistance  Bathing, Feeding, Dressing Bathing Assistance: Limited assistance Feeding assistance: Independent Dressing Assistance: Limited assistance     Functional Limitations Info  Sight Sight Info: Impaired (Glasses)        SPECIAL CARE FACTORS FREQUENCY  PT  (By licensed PT), OT (By licensed OT)     PT Frequency: 5x/week OT Frequency: 5x/week            Contractures Contractures Info: Not present    Additional Factors Info  Code Status, Allergies, Psychotropic, Insulin Sliding Scale Code Status Info: Full Allergies Info: Gabapentin, Chocolate, Diclofenac, Ceclor (Cefaclor), Codeine, Metformin And Related, Penicillins, Sulfa Antibiotics, Zithromax (Azithromycin) Psychotropic Info: Cymbalta Insulin Sliding Scale Info: See dc summary       Current Medications (12/02/2023):  This is the current hospital active medication list Current Facility-Administered Medications  Medication Dose Route Frequency Provider Last Rate Last Admin   acetaminophen (TYLENOL) tablet 650 mg  650 mg Oral Q4H PRN Rejeana Brock, MD   650 mg at 11/30/23 6962   Or   acetaminophen (TYLENOL) 160 MG/5ML solution 650 mg  650 mg Per Tube Q4H PRN Rejeana Brock, MD       Or   acetaminophen (TYLENOL) suppository 650 mg  650 mg Rectal Q4H PRN Rejeana Brock, MD       amLODipine (NORVASC) tablet 5 mg  5 mg Oral Daily Gevena Mart A, NP   5 mg at 12/02/23 1142   butalbital-acetaminophen-caffeine (FIORICET) 50-325-40 MG per tablet 1 tablet  1 tablet Oral Q4H PRN Mathews Argyle, NP   1 tablet at 11/30/23 1014   Chlorhexidine Gluconate Cloth 2 % PADS 6 each  6 each Topical Daily Rejeana Brock, MD  6 each at 12/01/23 0939   clevidipine (CLEVIPREX) infusion 0.5 mg/mL  0-21 mg/hr Intravenous Continuous Rejeana Brock, MD   Stopped at 11/30/23 [phone]   DULoxetine (CYMBALTA) DR capsule 30 mg  30 mg Oral Daily Rejeana Brock, MD   30 mg at 12/02/23 1142   enoxaparin (LOVENOX) injection 40 mg  40 mg Subcutaneous Daily Harolyn Rutherford, MD   40 mg at 12/02/23 1141   hydrALAZINE (APRESOLINE) injection 10-20 mg  10-20 mg Intravenous Q4H PRN Mathews Argyle, NP       hydrOXYzine (ATARAX) tablet 10 mg  10 mg Oral TID PRN Windell Norfolk, MD        insulin aspart (novoLOG) injection 0-15 Units  0-15 Units Subcutaneous TID WC Rejeana Brock, MD   2 Units at 12/01/23 1844   labetalol (NORMODYNE) injection 10-20 mg  10-20 mg Intravenous Q2H PRN Mathews Argyle, NP   20 mg at 11/30/23 1307   levothyroxine (SYNTHROID) tablet 125 mcg  125 mcg Oral Daily Rejeana Brock, MD   125 mcg at 12/02/23 1191   meclizine (ANTIVERT) tablet 25 mg  25 mg Oral Once Schuman, James T, PA-C       montelukast (SINGULAIR) tablet 10 mg  10 mg Oral Daily PRN Rejeana Brock, MD       nitrofurantoin (macrocrystal-monohydrate) (MACROBID) capsule 100 mg  100 mg Oral Q12H Windell Norfolk, MD   100 mg at 12/02/23 1142   Oral care mouth rinse  15 mL Mouth Rinse PRN Rejeana Brock, MD       rosuvastatin (CRESTOR) tablet 40 mg  40 mg Oral Daily Windell Norfolk, MD   40 mg at 12/02/23 1141   senna-docusate (Senokot-S) tablet 1 tablet  1 tablet Oral BID Rejeana Brock, MD   1 tablet at 12/02/23 1142   sodium chloride flush (NS) 0.9 % injection 10-40 mL  10-40 mL Intracatheter Q12H Estelle June A, DO   10 mL at 12/01/23 2145   sodium chloride flush (NS) 0.9 % injection 10-40 mL  10-40 mL Intracatheter PRN Royanne Foots, DO         Discharge Medications: Please see discharge summary for a list of discharge medications.  Relevant Imaging Results:  Relevant Lab Results:   Additional Information SSn: [id number] [place], [place]

## 2023-12-02 NOTE — Progress Notes (Signed)
 Physical Therapy Treatment Patient Details Name: Sandra Patrick MRN: [id number] DOB: 1955/10/20 Today's Date: 12/02/2023   History of Present Illness 68 y.o. female admitted on 11/29/23 for acute onset dizziness and HA.  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  Pt with significant PMH of DM, HTN, seizure.    PT Comments  Pt sleeping upon PT arrival to room, wakes easily but is drowsy stating she just wants to sleep. Pt requiring supervision to close guard for short distance gait with RW, pt with RLE incoordination and weakness with fatigue. Pt declines further intervention, states she wants to rest. PT to continue to follow.       If plan is discharge home, recommend the following: A little help with walking and/or transfers;A little help with bathing/dressing/bathroom;Assistance with cooking/housework;Assist for transportation;Help with stairs or ramp for entrance   Can travel by private vehicle        Equipment Recommendations  Rolling walker (2 wheels)    Recommendations for Other Services       Precautions / Restrictions Precautions Precautions: Fall Restrictions Weight Bearing Restrictions Per Provider Order: No     Mobility  Bed Mobility Overal bed mobility: Modified Independent                  Transfers Overall transfer level: Needs assistance Equipment used: Rolling walker (2 wheels) Transfers: Sit to/from Stand Sit to Stand: Contact guard assist           General transfer comment: min cues for safety and hand placement    Ambulation/Gait Ambulation/Gait assistance: Contact guard assist Gait Distance (Feet): 45 Feet Assistive device: Rolling walker (2 wheels) Gait Pattern/deviations: Step-through pattern, Staggering right, Knee flexed in stance - right Gait velocity: decr     General Gait Details: close guard for safety, cues for attention to R and increasing foot clearance   Stairs              Wheelchair Mobility     Tilt Bed    Modified Rankin (Stroke Patients Only) Modified Rankin (Stroke Patients Only) Pre-Morbid Rankin Score: No symptoms Modified Rankin: Moderately severe disability     Balance Overall balance assessment: Needs assistance Sitting-balance support: Feet supported, No upper extremity supported Sitting balance-Leahy Scale: Good Sitting balance - Comments: supervision EOB   Standing balance support: Single extremity supported, Bilateral upper extremity supported Standing balance-Leahy Scale: Poor Standing balance comment: reliant on device                            Communication Communication Communication: No apparent difficulties  Cognition Arousal: Alert Behavior During Therapy: WFL for tasks assessed/performed   PT - Cognitive impairments: No apparent impairments                         Following commands: Intact (1-2 step commands)      Cueing Cueing Techniques: Verbal cues  Exercises      General Comments General comments (skin integrity, edema, etc.): vss      Pertinent Vitals/Pain Pain Assessment Pain Assessment: No/denies pain Pain Intervention(s): Monitored during session    Home Living Family/patient expects to be discharged to:: Private residence Living Arrangements: Non-relatives/Friends Available Help at Discharge: Family;Available PRN/intermittently Type of Home: House Home Access: Stairs to enter Entrance Stairs-Rails: Right Entrance Stairs-Number of Steps: 5   Home Layout: One level Home Equipment: None Additional Comments: pt reports  she can drive, but does not, and 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 She says she is going home with her daughter when she gets here from [facility]. home set up provided above consistent with home set-up provided regarding [facility]    Prior Function            PT Goals (current goals can now be found in the care plan section) Acute  Rehab PT Goals Patient Stated Goal: to go home with her daughter tomorrow PT Goal Formulation: With patient Time For Goal Achievement: 12/14/23 Potential to Achieve Goals: Good Progress towards PT goals: Progressing toward goals    Frequency    Min 1X/week      PT Plan      Co-evaluation              AM-PAC PT "6 Clicks" Mobility   Outcome Measure  Help needed turning from your back to your side while in a flat bed without using bedrails?: None Help needed moving from lying on your back to sitting on the side of a flat bed without using bedrails?: None Help needed moving to and from a bed to a chair (including a wheelchair)?: A Little Help needed standing up from a chair using your arms (e.g., wheelchair or bedside chair)?: A Little Help needed to walk in hospital room?: A Little Help needed climbing 3-5 steps with a railing? : A Lot 6 Click Score: 19    End of Session Equipment Utilized During Treatment: Gait belt Activity Tolerance: Patient limited by fatigue Patient left: in bed;with call bell/phone within reach;with bed alarm set Nurse Communication: Mobility status PT Visit Diagnosis: Difficulty in walking, not elsewhere classified (R26.2);Unsteadiness on feet (R26.81)     Time: 8469-6295 PT Time Calculation (min) (ACUTE ONLY): 11 min  Charges:    $Therapeutic Activity: 8-22 mins PT General Charges $$ ACUTE PT VISIT: 1 Visit                     Marye Round, PT DPT Acute Rehabilitation Services Secure Chat Preferred  Office [phone]    Shelley Pooley Sheliah Plane 12/02/2023, 3:28 PM

## 2023-12-03 DIAGNOSIS — E66811 Obesity, class 1: Secondary | ICD-10-CM

## 2023-12-03 DIAGNOSIS — F411 Generalized anxiety disorder: Secondary | ICD-10-CM

## 2023-12-03 DIAGNOSIS — R1310 Dysphagia, unspecified: Secondary | ICD-10-CM | POA: Diagnosis not present

## 2023-12-03 DIAGNOSIS — I10 Essential (primary) hypertension: Secondary | ICD-10-CM

## 2023-12-03 DIAGNOSIS — Z6830 Body mass index (BMI) 30.0-30.9, adult: Secondary | ICD-10-CM

## 2023-12-03 DIAGNOSIS — I614 Nontraumatic intracerebral hemorrhage in cerebellum: Secondary | ICD-10-CM | POA: Diagnosis not present

## 2023-12-03 DIAGNOSIS — E785 Hyperlipidemia, unspecified: Secondary | ICD-10-CM

## 2023-12-03 DIAGNOSIS — R3989 Other symptoms and signs involving the genitourinary system: Secondary | ICD-10-CM

## 2023-12-03 LAB — COMPREHENSIVE METABOLIC PANEL
ALT: 14 U/L (ref 0–44)
AST: 16 U/L (ref 15–41)
Albumin: 3.8 g/dL (ref 3.5–5.0)
Alkaline Phosphatase: 48 U/L (ref 38–126)
Anion gap: 8 (ref 5–15)
BUN: 25 mg/dL — ABNORMAL HIGH (ref 8–23)
CO2: 23 mmol/L (ref 22–32)
Calcium: 8.8 mg/dL — ABNORMAL LOW (ref 8.9–10.3)
Chloride: 106 mmol/L (ref 98–111)
Creatinine, Ser: 0.83 mg/dL (ref 0.44–1.00)
GFR, Estimated: >60 mL/min (ref >60)
Glucose, Bld: 127 mg/dL — ABNORMAL HIGH (ref 70–99)
Potassium: 3.4 mmol/L — ABNORMAL LOW (ref 3.5–5.1)
Sodium: 137 mmol/L (ref 135–145)
Total Bilirubin: 0.9 mg/dL (ref 0.0–1.2)
Total Protein: 6.8 g/dL (ref 6.5–8.1)

## 2023-12-03 LAB — MAGNESIUM: Magnesium: 2 mg/dL (ref 1.7–2.4)

## 2023-12-03 LAB — CBC WITH DIFFERENTIAL/PLATELET
Abs Immature Granulocytes: 0.02 10*3/uL (ref 0.00–0.07)
Basophils Absolute: 0 10*3/uL (ref 0.0–0.1)
Basophils Relative: 1 %
Eosinophils Absolute: 0 10*3/uL (ref 0.0–0.5)
Eosinophils Relative: 0 %
HCT: 42 % (ref 36.0–46.0)
Hemoglobin: 13.8 g/dL (ref 12.0–15.0)
Immature Granulocytes: 0 %
Lymphocytes Relative: 30 %
Lymphs Abs: 2.1 10*3/uL (ref 0.7–4.0)
MCH: 29.7 pg (ref 26.0–34.0)
MCHC: 32.9 g/dL (ref 30.0–36.0)
MCV: 90.5 fL (ref 80.0–100.0)
Monocytes Absolute: 0.7 10*3/uL (ref 0.1–1.0)
Monocytes Relative: 10 %
Neutro Abs: 4.1 10*3/uL (ref 1.7–7.7)
Neutrophils Relative %: 59 %
Platelets: 197 10*3/uL (ref 150–400)
RBC: 4.64 MIL/uL (ref 3.87–5.11)
RDW: 13.5 % (ref 11.5–15.5)
WBC: 6.9 10*3/uL (ref 4.0–10.5)
nRBC: 0 % (ref 0.0–0.2)

## 2023-12-03 LAB — GLUCOSE, CAPILLARY
Glucose-Capillary: 115 mg/dL — ABNORMAL HIGH (ref 70–99)
Glucose-Capillary: 101 mg/dL — ABNORMAL HIGH (ref 70–99)
Glucose-Capillary: 107 mg/dL — ABNORMAL HIGH (ref 70–99)
Glucose-Capillary: 107 mg/dL — ABNORMAL HIGH (ref 70–99)

## 2023-12-03 LAB — PHOSPHORUS: Phosphorus: 3.5 mg/dL (ref 2.5–4.6)

## 2023-12-03 MED ORDER — AMLODIPINE BESYLATE 5 MG PO TABS
10.0000 mg | ORAL_TABLET | Freq: Every day | ORAL | Status: DC
  Administered 2023-12-04: 10 mg via ORAL
  Filled 2023-12-03: qty 2

## 2023-12-03 NOTE — Progress Notes (Signed)
 Speech Language Pathology Treatment: Cognitive-Linquistic  Patient Details Name: Sandra Patrick MRN: [id number] DOB: [birth year] Today's Date: 12/03/2023 Time: 1045-1100 SLP Time Calculation (min) (ACUTE ONLY): 15 min  Assessment / Plan / Recommendation Clinical Impression  Patient seen by SLP for skilled treatment focused on cognitive and speech goals. Patient's speech intelligibility is significantly improved and patient not speaking at rapid pace as she had during initial evaluation; speech is likely at her baseline. Patient appears more calm overall, voice continues to be somewhat monotone but patient is more interactive today than initial evaluation.  Patient is oriented x4 and is able to recall recent therapeutic and medical interventions at basic level. She continues to repeat things she has said during the session as well as repeats things she told this SLP during initial evaluation on 12/01/23. SLP questions if patient is at her cognitive baseline and if she had prior cognitive impairment. (Contacted daughter via phone call, left VM) She tells SLP of a "facial stroke" as well as h/o "brain bleed" from fall into a "black hole". She also tells SLP that she has been on disability for the past 8 years due to her DM and related leg weakness. SLP will continue to follow acutely.    HPI HPI: Patient is a 68 y.o. female with PMH: DM, HTN, self-reported h/o "brain bleed" 2-3 years ago from a fall, RLS, depression, anxiety. She presented to the hospital on 11/29/2023 for acute onset of dizziness and headache. CT head revealed a small volume of hemorrhage in the central right cerebellum.      SLP Plan  Continue with current plan of care      Recommendations for follow up therapy are one component of a multi-disciplinary discharge planning process, led by the attending physician.  Recommendations may be updated based on patient status, additional functional criteria and insurance authorization.     Recommendations                         PRN Cognitive communication deficit (R41.841);Dysarthria and anarthria (R47.1)     Continue with current plan of care    Angela Nevin, MA, CCC-SLP Speech Therapy

## 2023-12-03 NOTE — TOC Progression Note (Signed)
 Transition of Care [facility]) - Progression Note    Patient Details  Name: Sandra Patrick MRN: [id number] Date of Birth: [birth year]  [facility] CM/SW Contact  Baldemar Lenis, [place] Phone Number: 12/03/2023, 2:03 PM  Clinical Narrative:   CSW contacted [facility] to ask about status of update on insurance authorization, it is still pending at this time. CSW updated MD. CSW to follow.    Expected Discharge Plan: Skilled Nursing Facility Barriers to Discharge: English as a second language teacher, Continued Medical Work up, SNF Pending bed offer  Expected Discharge Plan and Services In-house Referral: Clinical Social Work   Post Acute Care Choice: Skilled Nursing Facility Living arrangements for the past 2 months: Mobile Home                                       Social Determinants of Health (SDOH) Interventions SDOH Screenings   Food Insecurity: No Food Insecurity (11/29/2023)  Housing: Low Risk  (11/29/2023)  Transportation Needs: No Transportation Needs (11/29/2023)  Utilities: Not At Risk (11/29/2023)  Social Connections: Socially Isolated (11/29/2023)  Tobacco Use: Low Risk  (11/29/2023)    Readmission Risk Interventions     No data to display

## 2023-12-03 NOTE — Plan of Care (Signed)
  Problem: Education: Goal: Knowledge of disease or condition will improve Outcome: Progressing Goal: Knowledge of secondary prevention will improve (MUST DOCUMENT ALL) Outcome: Progressing Goal: Knowledge of patient specific risk factors will improve (DELETE if not current risk factor) Outcome: Progressing   Problem: Intracerebral Hemorrhage Tissue Perfusion: Goal: Complications of Intracerebral Hemorrhage will be minimized Outcome: Progressing   Problem: Coping: Goal: Will verbalize positive feelings about self Outcome: Progressing Goal: Will identify appropriate support needs Outcome: Progressing   Problem: Health Behavior/Discharge Planning: Goal: Ability to manage health-related needs will improve Outcome: Progressing Goal: Goals will be collaboratively established with patient/family Outcome: Progressing   Problem: Self-Care: Goal: Ability to participate in self-care as condition permits will improve Outcome: Progressing Goal: Verbalization of feelings and concerns over difficulty with self-care will improve Outcome: Progressing Goal: Ability to communicate needs accurately will improve Outcome: Progressing   Problem: Nutrition: Goal: Risk of aspiration will decrease Outcome: Progressing Goal: Dietary intake will improve Outcome: Progressing   Problem: Education: Goal: Knowledge of General Education information will improve Description: Including pain rating scale, medication(s)/side effects and non-pharmacologic comfort measures Outcome: Progressing   Problem: Health Behavior/Discharge Planning: Goal: Ability to manage health-related needs will improve Outcome: Progressing   Problem: Clinical Measurements: Goal: Ability to maintain clinical measurements within normal limits will improve Outcome: Progressing Goal: Will remain free from infection Outcome: Progressing Goal: Diagnostic test results will improve Outcome: Progressing Goal: Respiratory  complications will improve Outcome: Progressing Goal: Cardiovascular complication will be avoided Outcome: Progressing   Problem: Activity: Goal: Risk for activity intolerance will decrease Outcome: Progressing   Problem: Nutrition: Goal: Adequate nutrition will be maintained Outcome: Progressing   Problem: Coping: Goal: Level of anxiety will decrease Outcome: Progressing   Problem: Elimination: Goal: Will not experience complications related to bowel motility Outcome: Progressing Goal: Will not experience complications related to urinary retention Outcome: Progressing   Problem: Pain Managment: Goal: General experience of comfort will improve and/or be controlled Outcome: Progressing   Problem: Safety: Goal: Ability to remain free from injury will improve Outcome: Progressing   Problem: Skin Integrity: Goal: Risk for impaired skin integrity will decrease Outcome: Progressing   Problem: Education: Goal: Ability to describe self-care measures that may prevent or decrease complications (Diabetes Survival Skills Education) will improve Outcome: Progressing Goal: Individualized Educational Video(s) Outcome: Progressing   Problem: Coping: Goal: Ability to adjust to condition or change in health will improve Outcome: Progressing   Problem: Fluid Volume: Goal: Ability to maintain a balanced intake and output will improve Outcome: Progressing   Problem: Health Behavior/Discharge Planning: Goal: Ability to identify and utilize available resources and services will improve Outcome: Progressing Goal: Ability to manage health-related needs will improve Outcome: Progressing   Problem: Metabolic: Goal: Ability to maintain appropriate glucose levels will improve Outcome: Progressing   Problem: Nutritional: Goal: Maintenance of adequate nutrition will improve Outcome: Progressing Goal: Progress toward achieving an optimal weight will improve Outcome: Progressing    Problem: Skin Integrity: Goal: Risk for impaired skin integrity will decrease Outcome: Progressing   Problem: Tissue Perfusion: Goal: Adequacy of tissue perfusion will improve Outcome: Progressing

## 2023-12-03 NOTE — Progress Notes (Signed)
  Patient Details Name: Sandra Patrick MRN: [id number] DOB: 1956-04-23  Patient's daughter Sandra Patrick) returned phone call to SLP to discuss her cognitive baseline. 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 In addition, Crystal has been concerned about patient's cognition, questions if she may have early onset dementia. She has had incidents of wayfinding difficulty. Other providers have expressed concern for patient's cognition to Crystal in the past. SLP is recommending f/u services at next venue of care.  Angela Nevin, MA, CCC-SLP Speech Therapy

## 2023-12-03 NOTE — Progress Notes (Addendum)
 STROKE TEAM PROGRESS NOTE    SIGNIFICANT HOSPITAL EVENTS 2/21 scented with acute onset of vertigo, nausea and vomiting.  CT head with right cerebellum ICH-admitted to ICU  INTERIM HISTORY/SUBJECTIVE  No family at the  bedside. No new neurological events overnight.   She is awake and alert in NAD. She is wondering when she will go home. She states she is back to normal but feels a little weak from laying in the bed.   Neurological exam is stable and unchanged.    OBJECTIVE  CBC    Component Value Date/Time   WBC 6.9 12/03/2023 0912   RBC 4.64 12/03/2023 0912   HGB 13.8 12/03/2023 0912   HCT 42.0 12/03/2023 0912   PLT 197 12/03/2023 0912   MCV 90.5 12/03/2023 0912   MCH 29.7 12/03/2023 0912   MCHC 32.9 12/03/2023 0912   RDW 13.5 12/03/2023 0912   LYMPHSABS 2.1 12/03/2023 0912   MONOABS 0.7 12/03/2023 0912   EOSABS 0.0 12/03/2023 0912   BASOSABS 0.0 12/03/2023 0912    BMET    Component Value Date/Time   NA 137 12/03/2023 0912   K 3.4 (L) 12/03/2023 0912   CL 106 12/03/2023 0912   CO2 23 12/03/2023 0912   GLUCOSE 127 (H) 12/03/2023 0912   BUN 25 (H) 12/03/2023 0912   CREATININE 0.83 12/03/2023 0912   CREATININE 0.81 12/16/2013 1018   CALCIUM 8.8 (L) 12/03/2023 0912   GFRNONAA >60 12/03/2023 0912   GFRNONAA 82 12/16/2013 1018    IMAGING past 24 hours No results found.   Vitals:   12/02/23 2315 12/03/23 0310 12/03/23 0726 12/03/23 1122  BP: (!) 154/87 (!) 150/78 (!) 148/76 (!) 141/86  Pulse: (!) 58 (!) 56 63 65  Resp: 18 18  16   Temp: 98.6 F (37 C) 98.6 F (37 C) 98.5 F (36.9 C) 99 F (37.2 C)  TempSrc: Oral Oral Oral Oral  SpO2: 95% 97% 96% 95%  Weight:      Height:         PHYSICAL EXAM General:  Alert, well-nourished, well-developed patient in no acute distress Psych:  Mood and affect appropriate for situation CV: Regular rate and rhythm on monitor Respiratory:  Regular, unlabored respirations on room air   NEURO:  Mental Status: AA&Ox3,  patient is able to give clear and coherent history Speech/Language: speech is without mild dysarthria ( she states is baseline).  Naming, repetition, fluency, and comprehension intact.  Cranial Nerves:  II: PERRL. Visual fields full.  III, IV, VI: Eyes are disconjugate with left exotropia-she states is baseline V: Sensation is intact to light touch and symmetrical to face.  VII: Face is symmetrical resting and smiling VIII: hearing intact to voice. IX, X: Palate elevates symmetrically. Phonation is normal.  ZO:[id number] shrug 5/5. XII: tongue is midline without fasciculations. Motor: 5/5 strength to all muscle groups tested.  Tone: is normal and bulk is normal Sensation- Intact to light touch bilaterally. Extinction absent to light touch to DSS.   Coordination: Ataxia in right arm and improving. HKS: no ataxia in BLE.No drift.  Gait- deferred    ASSESSMENT/PLAN  [name] is a 68 y.o. female with history of asthma, diabetes, hyperlipidemia, hypertension, irregular heartbeat, migraines, seizures, hypothyroidism, restless leg syndrome admitted for acute onset dizziness, nausea and vomiting.  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 HgbA1c 6.7 VTE prophylaxis -SCDs No antithrombotic prior to admission, now on No antithrombotic  Therapy recommendations:  Pending Disposition: Pending   Hypertension Home meds: Amlodipine 5 mg, Un Stable Cleviprex was on for short period of time currently is off Amlodipine restarted will increase to 10 mg today. Blood Pressure Goal: SBP between 130-150 for 24 hours and then less than 160   Hyperlipidemia Home meds: None LDL 74 goal < 70 Crestor 40mg  restarted    Diabetes type II Controlled Home meds: Trulicity HgbA1c 6.7, goal <  7.0 CBGs SSI Recommend close follow-up with PCP for better DM control   Dysphagia Patient has post-stroke dysphagia, SLP consulted    Diet   Diet Carb Modified Fluid consistency: Thin; Room service appropriate? Yes with Assist   Advance diet as tolerated  UTI  UA positive On Macrobid   Other Stroke Risk Factors Obesity Class 1:  Body mass index is 30.04 kg/m., BMI >/= 30 associated with increased stroke risk, recommend weight loss, diet and exercise as appropriate  Migraines   Other Active Problems Anxiety, will add Hydroxyzine as needed  Hospital day # 4  Sandra Mart DNP, ACNPC-AG  [facility]  ATTENDING ATTESTATION:  68 year old with cerebellar ICH with fourth ventricular extension.  She is doing well from neurological standpoint and has some mild ataxia otherwise unremarkable exam.  Baseline left eye exotropia.  Being considered for possible rehab, insurance pending.  All her questions were answered to her satisfaction.  Dr. Viviann Patrick evaluated pt independently, reviewed imaging, chart, labs. Discussed and formulated plan with the Resident/APP. Changes were made to the note where appropriate. Please see APP/resident note above for details.   Total 36 minutes spent on counseling patient and coordinating care, writing notes and reviewing chart.   Sandra Amaral,MD   To contact Stroke Continuity provider, please refer to [email or web address]. After hours, contact General Neurology

## 2023-12-03 NOTE — Hospital Course (Addendum)
 Patient is a 68 year old Caucasian obese female with a past medical history significant for DMT2, HTN, HLD, seizure disorder, thyroid disease who presented with acute onset of vertigo, nausea vomiting.  Given her symptoms underwent head CT scan which showed no acute intracranial hemorrhage.  Admitted to the neurology service and transferred to the [facility] service on 12/03/2023.  PT OT recommending SNF and appears medically stable to discharge given that she is completed her stroke workup but currently awaiting placement.   Assessment and Plan:  Right Cerebellar Nontraumatic Intracerebral Hemorrhage: Likely 2/2 Uncontrolled HTN. CT Head done and showed an acute R intraparenchymal (w/in the Dentate Nucleus) cerebellum ICH that extended into the fourth ventricle likely suggestive of hypertensive hemorrhage.  Repeat head CT without contrast on the next day showed that it was unchanged.  Echocardiogram showed an EF of 60 to 65% with no RWMA, LVDP being normal and a RVSF being normal. Lipid Panel as below. A1c was 6.7.  Neurology recommended no antithrombotics given ICH but recommending continuing rosuvastatin 40 mg p.o. Daily. PT/OT recommending SNF and awaiting placement and appears to be medically stable discharge  Dysphagia: Has Post-CVA dysphagia; SLP evaluated and now on Carb Modified Diet  Suspected UTI: WBC was 12.6 and U/A showed Hazy appearance, 50 Glucose, 20 Ketones, Large Leukocytes, + Nitrites, Many Bacteria, 0-5 RBC/hpf, and 11-20 WBC; Unfortunately no U/Cx done. Initiated on Nitrofurantoin 100 mg q12h x10 doses by Neurology and will continue for now  Essential HTN: Initially was Unstable and elevated and had to be placed on Cleviprex gtt which has since stopped. Now back on oral Amlodipine 10 mg (increased yesterday); C/w IV Hydralazine 10-20 mg q4hprn BP >150 and Labetalol 10-20 mg IV q2hprn HBP >150. CTM BP per protocol with current BP goal being 130-150 for 24h and <160 then onwards. Last BP  reading was 148/76  HLD: C/w Rosuvastatin 40 mg po Daily   Controlled DMII: HbA1c of 6.7 with current goal <7.0. Home meds include Trulicity which has been held. C/w Moderate Novolog SSI AC. CTM CBG per protocol. CBG Trend ranging from 101-126  Hypokalemia: Patient's K+ Level is now 3.4 Replete with po Kcl 40 mEQ BID x2. CTM and Replete as Necessary.Repeat CMP in the AM   Hypothyroidism: Checking TSH in the AM. C/w Levothyroxine 125 mcg po Daily for now  History of Seizures: Not taking Antiepileptics per [facility]  Depression and Anxiety: C/w Duloxetine 30 mg po Daily and Hydroxazyine 10 mg TIDprn Anxiety  Class I Obesity: Complicates overall prognosis and care; Estimated body mass index is 30.04 kg/m as calculated from the following:   Height as of this encounter: 5\' 2"  (1.575 m).   Weight as of this encounter: 74.5 kg.  -Weight Loss and Dietary Counseling given

## 2023-12-03 NOTE — Progress Notes (Signed)
 Physical Therapy Treatment Patient Details Name: Sandra Patrick MRN: [id number] DOB: 05/10/1956 Today's Date: 12/03/2023   History of Present Illness 68 y.o. female admitted on 11/29/23 for acute onset dizziness and HA.  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  Pt with significant PMH of DM, HTN, seizure.    PT Comments  Pt resting in bed on arrival and agreeable to session with good progress towards acute goals. Pt continues to be limited in safe mobility by R weakness and inattention, decreased activity tolerance and impaired balance/postural reactions. Pt progressing gait tolerance with RW support with grossly CGA for safety fading to supervision with increased distance. Pt continues to benefit from cues for attention to R environment and increased clearance on R. Pt requiring min A to steady to ascent/descend 2 steps with cues for sequencing throughout. Pt continues to benefit from skilled PT services to progress toward functional mobility goals.      If plan is discharge home, recommend the following: A little help with walking and/or transfers;A little help with bathing/dressing/bathroom;Assistance with cooking/housework;Assist for transportation;Help with stairs or ramp for entrance   Can travel by private vehicle        Equipment Recommendations  Rolling walker (2 wheels)    Recommendations for Other Services       Precautions / Restrictions Precautions Precautions: Fall Restrictions Weight Bearing Restrictions Per Provider Order: No     Mobility  Bed Mobility Overal bed mobility: Modified Independent                  Transfers Overall transfer level: Needs assistance Equipment used: Rolling walker (2 wheels) Transfers: Sit to/from Stand Sit to Stand: Contact guard assist           General transfer comment: min cues for safety and hand placement    Ambulation/Gait Ambulation/Gait assistance: Contact guard assist,  Supervision Gait Distance (Feet): 200 Feet Assistive device: Rolling walker (2 wheels) Gait Pattern/deviations: Step-through pattern, Staggering right, Knee flexed in stance - right Gait velocity: decr     General Gait Details: close guard for safety, cues for attention to R and increasing foot clearance, CGA fading to supervision   Stairs Stairs: Yes Stairs assistance: Min assist Stair Management: One rail Left, Step to pattern, Forwards Number of Stairs: 2 General stair comments: up and over single step in room x2 with cues for sequencing   Wheelchair Mobility     Tilt Bed    Modified Rankin (Stroke Patients Only) Modified Rankin (Stroke Patients Only) Pre-Morbid Rankin Score: No symptoms Modified Rankin: Moderately severe disability     Balance Overall balance assessment: Needs assistance Sitting-balance support: Feet supported, No upper extremity supported Sitting balance-Leahy Scale: Good Sitting balance - Comments: supervision EOB   Standing balance support: Single extremity supported, Bilateral upper extremity supported Standing balance-Leahy Scale: Poor Standing balance comment: reliant on device                            Communication Communication Communication: No apparent difficulties  Cognition Arousal: Alert Behavior During Therapy: WFL for tasks assessed/performed   PT - Cognitive impairments: No apparent impairments                       PT - Cognition Comments: see SLP notes, some impairments, but unsure if she is not close to her normal baseline. Following commands: Intact (1-2 step commands)  Cueing Cueing Techniques: Verbal cues  Exercises      General Comments General comments (skin integrity, edema, etc.): VSS on RA      Pertinent Vitals/Pain Pain Assessment Pain Assessment: No/denies pain Pain Intervention(s): Monitored during session    Home Living                          Prior Function             PT Goals (current goals can now be found in the care plan section) Acute Rehab PT Goals Patient Stated Goal: to go home PT Goal Formulation: With patient Time For Goal Achievement: 12/14/23 Progress towards PT goals: Progressing toward goals    Frequency    Min 1X/week      PT Plan      Co-evaluation              AM-PAC PT "6 Clicks" Mobility   Outcome Measure  Help needed turning from your back to your side while in a flat bed without using bedrails?: None Help needed moving from lying on your back to sitting on the side of a flat bed without using bedrails?: None Help needed moving to and from a bed to a chair (including a wheelchair)?: A Little Help needed standing up from a chair using your arms (e.g., wheelchair or bedside chair)?: A Little Help needed to walk in hospital room?: A Little Help needed climbing 3-5 steps with a railing? : A Little 6 Click Score: 20    End of Session Equipment Utilized During Treatment: Gait belt Activity Tolerance: Patient tolerated treatment well Patient left: in bed;with call bell/phone within reach;with bed alarm set Nurse Communication: Mobility status PT Visit Diagnosis: Difficulty in walking, not elsewhere classified (R26.2);Unsteadiness on feet (R26.81)     Time: 4782-9562 PT Time Calculation (min) (ACUTE ONLY): 13 min  Charges:    $Gait Training: 8-22 mins PT General Charges $$ ACUTE PT VISIT: 1 Visit                     Aaron Boeh R. PTA Acute Rehabilitation Services Office: [phone]   Catalina Antigua 12/03/2023, 4:04 PM

## 2023-12-03 NOTE — Progress Notes (Addendum)
 PROGRESS NOTE    Sandra Patrick  ION:[id number] DOB: 02/16/1956 DOA: 11/29/2023 PCP: No primary care provider on file.   Brief Narrative:  Patient is a 68 year old Caucasian obese female with a past medical history significant for DMT2, HTN, HLD, seizure disorder, thyroid disease who presented with acute onset of vertigo, nausea vomiting.  Given her symptoms underwent head CT scan which showed no acute intracranial hemorrhage.  Admitted to the neurology service and transferred to the [facility] service on 12/03/2023.  PT OT recommending SNF and appears medically stable to discharge given that she is completed her stroke workup but currently awaiting placement.   Assessment and Plan:  Right Cerebellar Nontraumatic Intracerebral Hemorrhage: Likely 2/2 Uncontrolled HTN. CT Head done and showed an acute R intraparenchymal (w/in the Dentate Nucleus) cerebellum ICH that extended into the fourth ventricle likely suggestive of hypertensive hemorrhage.  Repeat head CT without contrast on the next day showed that it was unchanged.  Echocardiogram showed an EF of 60 to 65% with no RWMA, LVDP being normal and a RVSF being normal. Lipid Panel as below. A1c was 6.7.  Neurology recommended no antithrombotics given ICH but recommending continuing rosuvastatin 40 mg p.o. Daily. PT/OT recommending SNF and awaiting placement and appears to be medically stable discharge  Dysphagia: Has Post-CVA dysphagia; SLP evaluated and now on Carb Modified Diet  Suspected UTI: WBC was 12.6 and U/A showed Hazy appearance, 50 Glucose, 20 Ketones, Large Leukocytes, + Nitrites, Many Bacteria, 0-5 RBC/hpf, and 11-20 WBC; Unfortunately no U/Cx done. Initiated on Nitrofurantoin 100 mg q12h x10 doses by Neurology and will continue for now  Essential HTN: Initially was Unstable and elevated and had to be placed on Cleviprex gtt which has since stopped. Now back on oral Amlodipine 10 mg (increased yesterday); C/w IV Hydralazine 10-20 mg q4hprn BP  >150 and Labetalol 10-20 mg IV q2hprn HBP >150. CTM BP per protocol with current BP goal being 130-150 for 24h and <160 then onwards. Last BP reading was 148/76  HLD: C/w Rosuvastatin 40 mg po Daily   Controlled DMII: HbA1c of 6.7 with current goal <7.0. Home meds include Trulicity which has been held. C/w Moderate Novolog SSI AC. CTM CBG per protocol. CBG Trend ranging from 101-126  Hypokalemia: Patient's K+ Level is now 3.4 Replete with po Kcl 40 mEQ BID x2. CTM and Replete as Necessary.Repeat CMP in the AM   Hypothyroidism: Checking TSH in the AM. C/w Levothyroxine 125 mcg po Daily for now  History of Seizures: Not taking Antiepileptics per [facility]  Depression and Anxiety: C/w Duloxetine 30 mg po Daily and Hydroxazyine 10 mg TIDprn Anxiety  Class I Obesity: Complicates overall prognosis and care; Estimated body mass index is 30.04 kg/m as calculated from the following:   Height as of this encounter: 5\' 2"  (1.575 m).   Weight as of this encounter: 74.5 kg.  -Weight Loss and Dietary Counseling given   DVT prophylaxis: enoxaparin (LOVENOX) injection 40 mg Start: 12/02/23 1215 SCD's Start: 11/29/23 2156    Code Status: Full Code Family Communication: No family present at bedside  Disposition Plan:  Level of care: Telemetry Medical Status is: Inpatient Remains inpatient appropriate because: Is medically stable for placement but awaiting SNF insurance authorization   Consultants:  Neurology  Procedures:  As delineated as above  Antimicrobials:  Anti-infectives (From admission, onward)    Start     Dose/Rate Route Frequency Ordered Stop   11/30/23 1045  nitrofurantoin (macrocrystal-monohydrate) (MACROBID) capsule 100 mg  100 mg Oral Every 12 hours 11/30/23 0956 12/05/23 0959       Subjective: Seen and examined at bedside patient was doing okay.  Felt okay and had no complaints but thinks that she has been going home.  Continues to feel weak but thinks she is much  improved.  No nausea or vomiting.  Objective: Vitals:   12/03/23 1122 12/03/23 1558 12/03/23 1956 12/03/23 2325  BP: (!) 141/86 (!) 150/85 (!) 161/68 (!) 145/81  Pulse: 65 62 63 (!) 59  Resp: 16 18 17 17   Temp: 99 F (37.2 C) 98.7 F (37.1 C) 99.6 F (37.6 C) 99.5 F (37.5 C)  TempSrc: Oral Oral Oral Oral  SpO2: 95% 97% 97% 96%  Weight:      Height:        Intake/Output Summary (Last 24 hours) at 12/03/2023 2329 Last data filed at 12/03/2023 2243 Gross per 24 hour  Intake 1100 ml  Output --  Net 1100 ml   Filed Weights   11/29/23 1950 11/29/23 2300  Weight: 80.7 kg 74.5 kg   Examination: Physical Exam:  Constitutional: WN/WD, obese Caucasian female in no acute distress; she is noted to have poor dentition Respiratory: Diminished to auscultation bilaterally, no wheezing, rales, rhonchi or crackles. Normal respiratory effort and patient is not tachypenic. No accessory muscle use.  Unlabored breathing Cardiovascular: RRR, no murmurs / rubs / gallops. S1 and S2 auscultated. No extremity edema.   Abdomen: Soft, non-tender, distended secondary by habitus. Bowel sounds positive.  GU: Deferred. Musculoskeletal: No clubbing / cyanosis of digits/nails. No joint deformity upper and lower extremities.  Skin: No rashes, lesions, ulcers on limited skin evaluation. No induration; Warm and dry.  Neurologic: CN 2-12 grossly intact with no focal deficits but does have disconjugate gaze with left-sided exotropia which is her baseline.  Psychiatric: She is awake and alert  Data Reviewed: I have personally reviewed following labs and imaging studies  CBC: Recent Labs  Lab 11/29/23 2024 12/03/23 0912  WBC 12.6* 6.9  NEUTROABS  --  4.1  HGB 14.9 13.8  HCT 45.9 42.0  MCV 92.2 90.5  PLT 215 197   Basic Metabolic Panel: Recent Labs  Lab 11/29/23 2024 12/03/23 0912  NA 142 137  K 3.8 3.4*  CL 106 106  CO2 22 23  GLUCOSE 206* 127*  BUN 18 25*  CREATININE 0.81 0.83  CALCIUM 9.5  8.8*  MG  --  2.0  PHOS  --  3.5   GFR: Estimated Creatinine Clearance: 63.9 mL/min (by C-G formula based on SCr of 0.83 mg/dL). Liver Function Tests: Recent Labs  Lab 11/29/23 2024 12/03/23 0912  AST 24 16  ALT 19 14  ALKPHOS 56 48  BILITOT 0.6 0.9  PROT 7.8 6.8  ALBUMIN 4.2 3.8   No results for input(s): "LIPASE", "AMYLASE" in the last 168 hours. No results for input(s): "AMMONIA" in the last 168 hours. Coagulation Profile: No results for input(s): "INR", "PROTIME" in the last 168 hours. Cardiac Enzymes: No results for input(s): "CKTOTAL", "CKMB", "CKMBINDEX", "TROPONINI" in the last 168 hours. BNP (last 3 results) No results for input(s): "PROBNP" in the last 8760 hours. HbA1C: No results for input(s): "HGBA1C" in the last 72 hours. CBG: Recent Labs  Lab 12/02/23 2101 12/03/23 0605 12/03/23 1126 12/03/23 1607 12/03/23 2119  GLUCAP 105* 115* 101* 107* 107*   Lipid Profile: No results for input(s): "CHOL", "HDL", "LDLCALC", "TRIG", "CHOLHDL", "LDLDIRECT" in the last 72 hours.  Thyroid Function Tests: No  results for input(s): "TSH", "T4TOTAL", "FREET4", "T3FREE", "THYROIDAB" in the last 72 hours. Anemia Panel: No results for input(s): "VITAMINB12", "FOLATE", "FERRITIN", "TIBC", "IRON", "RETICCTPCT" in the last 72 hours. Sepsis Labs: No results for input(s): "PROCALCITON", "LATICACIDVEN" in the last 168 hours.  Recent Results (from the past 240 hours)  MRSA Next Gen by PCR, Nasal     Status: None   Collection Time: 11/30/23 12:14 AM   Specimen: Nasal Mucosa; Nasal Swab  Result Value Ref Range Status   MRSA by PCR Next Gen NOT DETECTED NOT DETECTED Final    Comment: (NOTE) The GeneXpert MRSA Assay (FDA approved for NASAL specimens only), is one component of a comprehensive MRSA colonization surveillance program. It is not intended to diagnose MRSA infection nor to guide or monitor treatment for MRSA infections. Test performance is not FDA approved in patients  less than 73 years old. Performed at [facility], [address] [place], [place] [address]     Radiology Studies: No results found.  Scheduled Meds:  [START ON 12/04/2023] amLODipine  10 mg Oral Daily   Chlorhexidine Gluconate Cloth  6 each Topical Daily   DULoxetine  30 mg Oral Daily   enoxaparin (LOVENOX) injection  40 mg Subcutaneous Daily   insulin aspart  0-15 Units Subcutaneous TID WC   levothyroxine  125 mcg Oral Daily   meclizine  25 mg Oral Once   nitrofurantoin (macrocrystal-monohydrate)  100 mg Oral Q12H   rosuvastatin  40 mg Oral Daily   senna-docusate  1 tablet Oral BID   sodium chloride flush  10-40 mL Intracatheter Q12H   Continuous Infusions:   LOS: 4 days   Marguerita Merles, DO [facility] Available via Epic secure chat 7am-7pm After these hours, please refer to coverage provider listed on [email or web address] 12/03/2023, 11:29 PM

## 2023-12-03 NOTE — Care Management Important Message (Signed)
 Important Message  Patient Details  Name: Sandra Patrick MRN: [id number] Date of Birth: June 26, 1956   Important Message Given:  Yes - Medicare IM     Dorena Bodo 12/03/2023, 1:46 PM

## 2023-12-04 DIAGNOSIS — I614 Nontraumatic intracerebral hemorrhage in cerebellum: Secondary | ICD-10-CM | POA: Diagnosis not present

## 2023-12-04 LAB — CBC WITH DIFFERENTIAL/PLATELET
Abs Immature Granulocytes: 0.01 10*3/uL (ref 0.00–0.07)
Basophils Absolute: 0 10*3/uL (ref 0.0–0.1)
Basophils Relative: 1 %
Eosinophils Absolute: 0 10*3/uL (ref 0.0–0.5)
Eosinophils Relative: 1 %
HCT: 42.8 % (ref 36.0–46.0)
Hemoglobin: 14.1 g/dL (ref 12.0–15.0)
Immature Granulocytes: 0 %
Lymphocytes Relative: 46 %
Lymphs Abs: 2.8 10*3/uL (ref 0.7–4.0)
MCH: 29.7 pg (ref 26.0–34.0)
MCHC: 32.9 g/dL (ref 30.0–36.0)
MCV: 90.1 fL (ref 80.0–100.0)
Monocytes Absolute: 0.6 10*3/uL (ref 0.1–1.0)
Monocytes Relative: 10 %
Neutro Abs: 2.5 10*3/uL (ref 1.7–7.7)
Neutrophils Relative %: 42 %
Platelets: 211 10*3/uL (ref 150–400)
RBC: 4.75 MIL/uL (ref 3.87–5.11)
RDW: 13.2 % (ref 11.5–15.5)
WBC: 6 10*3/uL (ref 4.0–10.5)
nRBC: 0 % (ref 0.0–0.2)

## 2023-12-04 LAB — COMPREHENSIVE METABOLIC PANEL
ALT: 15 U/L (ref 0–44)
AST: 18 U/L (ref 15–41)
Albumin: 3.8 g/dL (ref 3.5–5.0)
Alkaline Phosphatase: 52 U/L (ref 38–126)
Anion gap: 10 (ref 5–15)
BUN: 24 mg/dL — ABNORMAL HIGH (ref 8–23)
CO2: 24 mmol/L (ref 22–32)
Calcium: 9.1 mg/dL (ref 8.9–10.3)
Chloride: 106 mmol/L (ref 98–111)
Creatinine, Ser: 0.83 mg/dL (ref 0.44–1.00)
GFR, Estimated: >60 mL/min (ref >60)
Glucose, Bld: 112 mg/dL — ABNORMAL HIGH (ref 70–99)
Potassium: 3.6 mmol/L (ref 3.5–5.1)
Sodium: 140 mmol/L (ref 135–145)
Total Bilirubin: 0.7 mg/dL (ref 0.0–1.2)
Total Protein: 6.9 g/dL (ref 6.5–8.1)

## 2023-12-04 LAB — GLUCOSE, CAPILLARY
Glucose-Capillary: 124 mg/dL — ABNORMAL HIGH (ref 70–99)
Glucose-Capillary: 118 mg/dL — ABNORMAL HIGH (ref 70–99)

## 2023-12-04 LAB — PHOSPHORUS: Phosphorus: 3.8 mg/dL (ref 2.5–4.6)

## 2023-12-04 LAB — TSH: TSH: 2.135 u[IU]/mL (ref 0.350–4.500)

## 2023-12-04 LAB — MAGNESIUM: Magnesium: 2.1 mg/dL (ref 1.7–2.4)

## 2023-12-04 MED ORDER — HYDROXYZINE HCL 10 MG PO TABS: 10.0000 mg | ORAL_TABLET | Freq: Three times a day (TID) | ORAL | 0 refills | Status: AC | PRN

## 2023-12-04 MED ORDER — AMLODIPINE BESYLATE 10 MG PO TABS: 10.0000 mg | ORAL_TABLET | Freq: Every day | ORAL | 3 refills | Status: AC

## 2023-12-04 NOTE — TOC Transition Note (Signed)
 Transition of Care [facility]) - Discharge Note   Patient Details  Name: Sandra Patrick MRN: [id number] Date of Birth: Jul 01, 1956  Transition of Care [facility]) CM/SW Contact:  Kermit Balo, RN Phone Number: 12/04/2023, 10:52 AM   Clinical Narrative:     Pt has decided to d/c home with home health instead of SNF. CM has verified with her daughter (with pt permission) that this is the plan.  Pt is going to a friends home for a few days. Her daughter is driving from [place] on Saturday to get her and take her back to [place].  Walker ordered through [employer] and will be delivered to the room.  CM has provided the patient with home health orders for [place]. She is seeing her PcP: Cora Collum on March 5th. Her PCP will arrange the [facility] services in [place]. CM will also fax the orders to: [phone] Pt has transportation home today.  Final next level of care: Home w Home Health Services Barriers to Discharge: No Barriers Identified   Patient Goals and CMS Choice Patient states their goals for this hospitalization and ongoing recovery are:: Rehab CMS Medicare.gov Compare Post Acute Care list provided to:: Patient Choice offered to / list presented to : Patient, Adult Children [place] ownership interest in [facility].provided to:: Patient    Discharge Placement                       Discharge Plan and Services Additional resources added to the After Visit Summary for   In-house Referral: Clinical Social Work   Post Acute Care Choice: Skilled Nursing Facility          DME Arranged: Dan Humphreys rolling DME Agency: [employer] Date DME Agency Contacted: 12/04/23   Representative spoke with at DME Agency: Wende Crease HH Arranged: PT, OT, Nurse's Aide          Social Drivers of Health (SDOH) Interventions SDOH Screenings   Food Insecurity: No Food Insecurity (11/29/2023)  Housing: Low Risk  (11/29/2023)  Transportation Needs: No Transportation Needs (11/29/2023)  Utilities: Not At  Risk (11/29/2023)  Social Connections: Socially Isolated (11/29/2023)  Tobacco Use: Low Risk  (11/29/2023)     Readmission Risk Interventions     No data to display

## 2023-12-04 NOTE — Progress Notes (Addendum)
 STROKE TEAM PROGRESS NOTE    SIGNIFICANT HOSPITAL EVENTS 2/21 scented with acute onset of vertigo, nausea and vomiting.  CT head with right cerebellum ICH-admitted to ICU  INTERIM HISTORY/SUBJECTIVE  No family at the bedside Neurological exam is stable and unchanged.  Patient is planning on being discharged today  OBJECTIVE  CBC    Component Value Date/Time   WBC 6.0 12/04/2023 0600   RBC 4.75 12/04/2023 0600   HGB 14.1 12/04/2023 0600   HCT 42.8 12/04/2023 0600   PLT 211 12/04/2023 0600   MCV 90.1 12/04/2023 0600   MCH 29.7 12/04/2023 0600   MCHC 32.9 12/04/2023 0600   RDW 13.2 12/04/2023 0600   LYMPHSABS 2.8 12/04/2023 0600   MONOABS 0.6 12/04/2023 0600   EOSABS 0.0 12/04/2023 0600   BASOSABS 0.0 12/04/2023 0600    BMET    Component Value Date/Time   NA 140 12/04/2023 0600   K 3.6 12/04/2023 0600   CL 106 12/04/2023 0600   CO2 24 12/04/2023 0600   GLUCOSE 112 (H) 12/04/2023 0600   BUN 24 (H) 12/04/2023 0600   CREATININE 0.83 12/04/2023 0600   CREATININE 0.81 12/16/2013 1018   CALCIUM 9.1 12/04/2023 0600   GFRNONAA >60 12/04/2023 0600   GFRNONAA 82 12/16/2013 1018    IMAGING past 24 hours No results found.   Vitals:   12/03/23 2325 12/04/23 0325 12/04/23 0826 12/04/23 1110  BP: (!) 145/81 135/75 (!) 147/79 (!) 144/78  Pulse: (!) 59 61 (!) 57 63  Resp: 17 18  16   Temp: 99.5 F (37.5 C) 98.1 F (36.7 C) 98.6 F (37 C) 98.5 F (36.9 C)  TempSrc: Oral Oral Oral Oral  SpO2: 96% 93% 96% 98%  Weight:      Height:         PHYSICAL EXAM General:  Alert, well-nourished, well-developed patient in no acute distress Psych:  Mood and affect appropriate for situation CV: Regular rate and rhythm on monitor Respiratory:  Regular, unlabored respirations on room air   NEURO:  Mental Status: AA&Ox3, patient is able to give clear and coherent history Speech/Language: speech is without mild dysarthria ( she states is baseline).  Naming, repetition, fluency, and  comprehension intact.  Cranial Nerves:  II: PERRL. Visual fields full.  III, IV, VI: Eyes are disconjugate with left exotropia-she states is baseline V: Sensation is intact to light touch and symmetrical to face.  VII: Face is symmetrical resting and smiling VIII: hearing intact to voice. IX, X: Palate elevates symmetrically. Phonation is normal.  ZO:[id number] shrug 5/5. XII: tongue is midline without fasciculations. Motor: 5/5 strength to all muscle groups tested.  Tone: is normal and bulk is normal Sensation- Intact to light touch bilaterally. Extinction absent to light touch to DSS.   Coordination: Ataxia in right arm and improving. HKS: no ataxia in BLE.No drift.  Gait- deferred    ASSESSMENT/PLAN  Sandra Patrick is a 68 y.o. female with history of asthma, diabetes, hyperlipidemia, hypertension, irregular heartbeat, migraines, seizures, hypothyroidism, restless leg syndrome admitted for acute onset dizziness, nausea and vomiting.  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 HgbA1c 6.7 VTE prophylaxis -SCDs No antithrombotic prior to admission, now on No antithrombotic  Will need neurology outpatient follow-up in 8 weeks after discharge Therapy recommendations:  Pending Disposition: Pending   Hypertension Home meds: Amlodipine  5 mg, Un Stable Cleviprex was on for short period of time currently is off Amlodipine restarted will increase to 10 mg today. Blood Pressure Goal: SBP between 130-150 for 24 hours and then less than 160   Hyperlipidemia Home meds: None LDL 74 goal < 70 Crestor 40mg  restarted    Diabetes type II Controlled Home meds: Trulicity HgbA1c 6.7, goal < 7.0 CBGs SSI Recommend close follow-up with PCP for better DM control   Dysphagia Patient has  post-stroke dysphagia, SLP consulted    Diet   Diet Carb Modified Fluid consistency: Thin; Room service appropriate? 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 She is doing well from neurological standpoint and has some mild ataxia otherwise unremarkable exam. Baseline left eye exotropia.   CIR today.  Neurology will sign off.   Stroke clinic f/u [facility] 6-8 weeks after rehab discharge.   Dr. Viviann Spare evaluated pt independently, reviewed imaging, chart, labs. Discussed and formulated plan with the Resident/APP. Changes were made to the note where appropriate. Please see APP/resident note above for details.   Total 36 minutes spent on counseling patient and coordinating care, writing notes and reviewing chart.   Sandra Slattery,MD   To contact Stroke Continuity provider, please refer to [email or web address]. After hours, contact General Neurology

## 2023-12-04 NOTE — Plan of Care (Signed)
  Problem: Intracerebral Hemorrhage Tissue Perfusion: Goal: Complications of Intracerebral Hemorrhage will be minimized Outcome: Progressing   Problem: Coping: Goal: Will verbalize positive feelings about self Outcome: Progressing Goal: Will identify appropriate support needs Outcome: Progressing   Problem: Self-Care: Goal: Ability to participate in self-care as condition permits will improve Outcome: Progressing Goal: Verbalization of feelings and concerns over difficulty with self-care will improve Outcome: Progressing Goal: Ability to communicate needs accurately will improve Outcome: Progressing   Problem: Clinical Measurements: Goal: Ability to maintain clinical measurements within normal limits will improve Outcome: Progressing Goal: Will remain free from infection Outcome: Progressing Goal: Diagnostic test results will improve Outcome: Progressing Goal: Respiratory complications will improve Outcome: Progressing Goal: Cardiovascular complication will be avoided Outcome: Progressing   Problem: Activity: Goal: Risk for activity intolerance will decrease Outcome: Progressing   Problem: Nutrition: Goal: Adequate nutrition will be maintained Outcome: Progressing   Problem: Pain Managment: Goal: General experience of comfort will improve and/or be controlled Outcome: Progressing   Problem: Elimination: Goal: Will not experience complications related to bowel motility Outcome: Progressing Goal: Will not experience complications related to urinary retention Outcome: Progressing   Problem: Skin Integrity: Goal: Risk for impaired skin integrity will decrease Outcome: Progressing   Problem: Safety: Goal: Ability to remain free from injury will improve Outcome: Progressing

## 2023-12-04 NOTE — Discharge Summary (Signed)
 Physician Discharge Summary  [name] WGN:[id number] DOB: [birth year] DOA: 11/29/2023  PCP: No primary care provider on file.  Admit date: 11/29/2023 Discharge date: 12/04/2023  Time spent: 46 minutes  Recommendations for Outpatient Follow-up:  Requires Chem-7 CBC 1 week Requires addition of meds for further blood pressure control going forward Obtain A1c LDL in about 3 months Completed treatment with antibiotics for UTI Follow-up TSH in the setting and repeat as needed  Discharge Diagnoses:  MAIN problem for hospitalization   Right cerebellar nontraumatic intracerebral hemorrhage Presumed UTI no urine culture Uncontrolled HTN  Please see below for itemized issues addressed in HOpsital- refer to other progress notes for clarity if needed  Discharge Condition: Fair  Diet recommendation: Diabetic heart healthy  Filed Weights   11/29/23 1950 11/29/23 2300  Weight: 80.7 kg 74.5 kg    History of present illness:  68 home dwelling white female Known SVT followed by [facility] Dr. Donnetta Simpers status post SVT ablation 2019 HTN HLD Thyroid disease 2/21 present Sandra Patrick, ED statin dizziness nausea vomiting without headache chest pain fever or chills-was hypertensive on admission on Cleviprex gtt. CT head at admit showed cerebellar hemorrhage with extension into fourth ventricle-patient admitted by neurology service 2/22 repeat CT head showed unchanged small volume hemorrhage central right cerebellum fourth ventricle cisterna magna and no cerebellar edema posterior fossa mass effect or complicating features  Patient was started on treatment for presumed UTI treated with Fioricet for headaches   Hospital Course:  Right cerebellar nontraumatic intracerebral hemorrhage Evaluated by neurology who felt patient was not a candidate for antithrombotic going forward recommended resumption of Crestor and Trulicity and as an outpatient will need reevaluation of CBG/LDL goals Evaluated  by therapy as did have some dysphagia and cognitive issues initially but these resolved rapidly during hospital stay and she was eating a full diet and was completely cognitive intact at time of discharge Physical therapy recommended skilled-patient was in her right mind and elected to go to her friend's house for several days and then transition to may be going to [place] with her daughter at time of discharge  Presumed UTI-no urine culture available-WBC was 12 with large leukocytes nitrites and bacteria-patient completed 5 days therapy and this was discontinued  Hypertensive urgency on admission Initially on Cleviprex gtt. 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 CBGs were ranging from 100s to 140s during hospital stay A1c was 6.7 indicating reasonable control Patient was reinitiated on Trulicity at discharge  Electrolyte abnormalities Hypokalemia is currently now resolved  Bipolar Patient was continued on duloxetine 30 and patient was given limited prescription of hydroxyzine 10 x 3 times daily  Hypothyroid Resumed on levothyroxine 125 daily  Reported history of seizures not on antiepileptics Not on meds   Discharge Exam: Vitals:   12/04/23 0325 12/04/23 0826  BP: 135/75 (!) 147/79  Pulse: 61 (!) 57  Resp: 18   Temp: 98.1 F (36.7 C) 98.6 F (37 C)  SpO2: 93% 96%    Subj on day of d/c   Awake coherent pleasant interactive Tells me she feels better and asking if she can go home No fever chills nausea vomiting and does not feel dizzy at this time-cannot tell me date  time year  General Exam on discharge  EOMI NCAT no focal deficit She has strabismus outwards of the left eye S1-S2 holosystolic  murmur LUSB On monitors she is in sinus with sinus bradycardia at times Chest is relatively clear no wheeze rales rhonchi Able to's lift her arms above her head Reflexes are slightly brisk on the left lower extremity and upper extremities right side is attenuated Babinski is downgoing Power 5/5 grossly Deferred rest of neuroexam  Discharge Instructions   Discharge Instructions     Diet - low sodium heart healthy   Complete by: As directed    Discharge instructions   Complete by: As directed    This hospital stay you were diagnosed with a small bleed on your brain with likely secondary to uncontrolled hypertension You will require stricter control of your blood pressure going forward and we have resumed amlodipine at a higher dose of 10 mg and you may need an addition of meds to this going forward when you follow-up with primary care We will coordinate for home health in the outpatient setting you will get therapy at home I will order you a rolling walker to make sure that you are stable and make sure that you get some therapy   Increase activity slowly   Complete by: As directed       Allergies as of 12/04/2023       Reactions   Gabapentin Itching, Other (See Comments)   Other reaction(s): Facial Edema (intolerance); chronic diarrhea   Chocolate Nausea Only, Other (See Comments)   Face feels puffy   Diclofenac Itching   Ceclor [cefaclor] Rash   Codeine Swelling, Rash   Metformin And Related Diarrhea   Penicillins Rash   Sulfa Antibiotics Rash   Zithromax [azithromycin] Rash        Medication List     TAKE these medications    acetaminophen 325 MG tablet Commonly known as: TYLENOL Take by mouth every 6 (six) hours as needed for headache or mild pain (pain score 1-3).   albuterol 108 (90 Base) MCG/ACT inhaler Commonly known as: VENTOLIN HFA Inhale 2 puffs into the lungs every 6 (six) hours as needed for wheezing or shortness of breath.   amLODipine 10 MG  tablet Commonly known as: NORVASC Take 1 tablet (10 mg total) by mouth daily. What changed:  medication strength how much to take   DULoxetine 30 MG capsule Commonly known as: CYMBALTA Take 30 mg by mouth daily.   hydrOXYzine 10 MG tablet Commonly known as: ATARAX Take 1 tablet (10 mg total) by mouth 3 (three) times daily as needed for anxiety.   levothyroxine 125 MCG tablet Commonly known as: SYNTHROID Take 125 mcg by mouth daily.   montelukast 10 MG tablet Commonly known as: SINGULAIR Take 10 mg by mouth as needed (allergy).   rosuvastatin 40 MG tablet Commonly known as: CRESTOR Take 40 mg by mouth at bedtime.   Trulicity 1.5 MG/0.5ML Soaj Generic drug: Dulaglutide Inject 1.5 mg into the skin once a week. Inject on Thursday               Durable Medical Equipment  (From admission, onward)           Start     Ordered   12/04/23 1013  DME Walker  Once       Question Answer Comment  Walker: With 5 Inch Wheels   Patient needs a walker to treat with the following condition Stroke (cerebrum) (HCC)  12/04/23 1013           Allergies  Allergen Reactions   Gabapentin Itching and Other (See Comments)    Other reaction(s): Facial Edema (intolerance); chronic diarrhea   Chocolate Nausea Only and Other (See Comments)    Face feels puffy   Diclofenac Itching   Ceclor [Cefaclor] Rash   Codeine Swelling and Rash   Metformin And Related Diarrhea   Penicillins Rash   Sulfa Antibiotics Rash   Zithromax [Azithromycin] Rash    Contact information for follow-up providers     [facility]. Schedule an appointment as soon as possible for a visit in 2 month(s).   Specialty: Neurology Why: Please call make an appointment for 2 months after discharge Contact information: [address] [place] [address] [phone]             Contact information for after-discharge care     Destination      [facility] .   Service: Skilled Nursing Contact information: [address] [place] [address] [phone]                      The results of significant diagnostics from this hospitalization (including imaging, microbiology, ancillary and laboratory) are listed below for reference.    Significant Diagnostic Studies: ECHOCARDIOGRAM COMPLETE Result Date: 11/30/2023    ECHOCARDIOGRAM REPORT   Patient Name:   Sandra Patrick Date of Exam: 11/30/2023 Medical Rec #:  [id number]     Height:       62.0 in Accession #:    [id number]    Weight:       164.2 lb Date of Birth:  November 18, 1955     BSA:          1.758 m Patient Age:    67 years      BP:           127/70 mmHg Patient Gender: F             HR:           68 bpm. Exam Location:  Inpatient Procedure: 2D Echo, Cardiac Doppler and Color Doppler (Both Spectral and Color            Flow Doppler were utilized during procedure).                                 MODIFIED REPORT:   This report was modified by Tessa Lerner on 11/30/2023 due to Need to add the                              conclusion statement.  Indications:     Stroke I63.9  History:         Patient has prior history of Echocardiogram examinations, most                  recent 07/17/2013. Risk Factors:Hypertension.  Sonographer:     Rosaland Lao Referring Phys:  Tara.Kingdom MCNEILL P KIRKPATRICK Diagnosing Phys: Tessa Lerner IMPRESSIONS  1. Left ventricular ejection fraction, by estimation, is 60 to 65%. The left ventricle has normal function. The left ventricle has no regional wall motion abnormalities. Left ventricular diastolic parameters were normal.  2. Right ventricular systolic function is normal. The right ventricular size is normal. There is  normal pulmonary artery systolic pressure.  3. The mitral valve is degenerative. No evidence of mitral valve regurgitation. No evidence of mitral stenosis.  4. The aortic valve was not well visualized.  Aortic valve regurgitation is not visualized. No aortic stenosis is present. Comparison(s): No prior Echocardiogram. Conclusion(s)/Recommendation(s): No intracardiac source of embolism detected on this transthoracic study. Consider a transesophageal echocardiogram to exclude cardiac source of embolism if clinically indicated. FINDINGS  Left Ventricle: Left ventricular ejection fraction, by estimation, is 60 to 65%. The left ventricle has normal function. The left ventricle has no regional wall motion abnormalities. Strain imaging was not performed. The left ventricular internal cavity  size was normal in size. There is no left ventricular hypertrophy of the basal-septal segment. Left ventricular diastolic parameters were normal. Right Ventricle: The right ventricular size is normal. No increase in right ventricular wall thickness. Right ventricular systolic function is normal. There is normal pulmonary artery systolic pressure. The tricuspid regurgitant velocity is 2.26 m/s, and  with an assumed right atrial pressure of 3 mmHg, the estimated right ventricular systolic pressure is 23.4 mmHg. Left Atrium: Left atrial size was normal in size. Right Atrium: Right atrial size was normal in size. Pericardium: There is no evidence of pericardial effusion. Mitral Valve: The mitral valve is degenerative in appearance. Mild mitral annular calcification. No evidence of mitral valve regurgitation. No evidence of mitral valve stenosis. Tricuspid Valve: The tricuspid valve is normal in structure. Tricuspid valve regurgitation is mild . No evidence of tricuspid stenosis. Aortic Valve: The aortic valve was not well visualized. Aortic valve regurgitation is not visualized. No aortic stenosis is present. Pulmonic Valve: The pulmonic valve was grossly normal. Pulmonic valve regurgitation is trivial. No evidence of pulmonic stenosis. Aorta: The aortic root and ascending aorta are structurally normal, with no evidence of dilitation.  IAS/Shunts: The atrial septum is grossly normal. Additional Comments: 3D imaging was not performed.  LEFT VENTRICLE PLAX 2D LVIDd:         4.50 cm   Diastology LVIDs:         2.70 cm   LV e' medial:    4.54 cm/s LV PW:         0.80 cm   LV E/e' medial:  12.3 LV IVS:        0.90 cm   LV e' lateral:   4.20 cm/s LVOT diam:     2.10 cm   LV E/e' lateral: 13.3 LV SV:         67 LV SV Index:   38 LVOT Area:     3.46 cm  RIGHT VENTRICLE             IVC RV Basal diam:  2.60 cm     IVC diam: 1.50 cm RV S prime:     12.60 cm/s TAPSE (M-mode): 2.2 cm LEFT ATRIUM             Index        RIGHT ATRIUM           Index LA diam:        4.00 cm 2.28 cm/m   RA Area:     10.90 cm LA Vol (A2C):   41.2 ml 23.43 ml/m  RA Volume:   18.40 ml  10.47 ml/m LA Vol (A4C):   51.2 ml 29.12 ml/m LA Biplane Vol: 46.2 ml 26.28 ml/m  AORTIC VALVE LVOT Vmax:   82.90 cm/s LVOT Vmean:  50.100 cm/s LVOT VTI:  0.193 m  AORTA Ao Root diam: 2.80 cm Ao Asc diam:  3.70 cm MITRAL VALVE               TRICUSPID VALVE MV Area (PHT): 2.83 cm    TR Peak grad:   20.4 mmHg MV Decel Time: 268 msec    TR Vmax:        226.00 cm/s MV E velocity: 55.80 cm/s MV A velocity: 82.10 cm/s  SHUNTS MV E/A ratio:  0.68        Systemic VTI:  0.19 m                            Systemic Diam: 2.10 cm Sunit Tolia Electronically signed by Tessa Lerner Signature Date/Time: 11/30/2023/2:59:23 PM    Final (Updated)    CT HEAD WO CONTRAST ( ) Result Date: 11/30/2023 CLINICAL DATA:  68 year old female with dizziness, right cerebellar hemorrhage. EXAM: CT HEAD WITHOUT CONTRAST TECHNIQUE: Contiguous axial images were obtained from the base of the skull through the vertex without intravenous contrast. RADIATION DOSE REDUCTION: This exam was performed according to the departmental dose-optimization program which includes automated exposure control, adjustment of the mA and/or kV according to patient size and/or use of iterative reconstruction technique. COMPARISON:  Head CT  11/29/2023. FINDINGS: Brain: Right deep cerebellar hyperdense hemorrhage with extension into the 4th ventricle. Estimated intra-axial component is stable, 15 x 18 x 10 mm (AP by transverse by CC), about 1 mL. Unchanged volume of blood in the 4th ventricle and cisterna magna. Basilar cisterns remain patent. No posterior fossa mass effect. No discrete cerebellar edema. Stable gray-white matter differentiation throughout the brain. No ventriculomegaly. No supratentorial mass effect or midline shift. Moderate patchy cerebral white matter and deep gray nuclei hypodense heterogeneity. Vascular: Calcified atherosclerosis at the skull base. No suspicious intracranial vascular hyperdensity. Skull: Intact, No acute osseous abnormality identified. Sinuses/Orbits: Visualized paranasal sinuses and mastoids are stable and well aerated. Other: Stable leftward gaze. Visualized scalp soft tissues are within normal limits. IMPRESSION: 1. Unchanged small volume of hemorrhage in the central right cerebellum, 4th ventricle, cisterna magna. No cerebellar edema, posterior fossa mass effect, or complicating features. 2. No new intracranial abnormality. Evidence of underlying small vessel disease. Electronically Signed   By: [name] M.D.   On: 11/30/2023 05:55   CT Head Wo Contrast Result Date: 11/29/2023 CLINICAL DATA:  Dizziness EXAM: CT HEAD WITHOUT CONTRAST TECHNIQUE: Contiguous axial images were obtained from the base of the skull through the vertex without intravenous contrast. RADIATION DOSE REDUCTION: This exam was performed according to the departmental dose-optimization program which includes automated exposure control, adjustment of the mA and/or kV according to patient size and/or use of iterative reconstruction technique. COMPARISON:  None Available. FINDINGS: Brain: There is acute intraparenchymal hemorrhage within the dentate nucleus of the right cerebellum, extending into the fourth ventricle. No hydrocephalus. Chronic  ischemic white matter changes. No midline shift or other mass effect. The intraparenchymal hematoma measures approximately 1.4 x 1.1 cm. Vascular: Carotid calcification Skull: Negative Sinuses/Orbits: Paranasal sinuses are clear.  Normal orbits. Other: None IMPRESSION: 1. Acute intraparenchymal hemorrhage within the dentate nucleus of the right cerebellum, extending into the fourth ventricle. This location is most typical of hypertensive hemorrhage. 2. No hydrocephalus. Critical Value/emergent results were called by telephone at the time of interpretation on 11/29/2023 at 9:09 pm to provider [facility] , who verbally acknowledged these results. Electronically Signed   By: [name]  M.D.   On: 11/29/2023 21:10    Microbiology: Recent Results (from the past 240 hours)  MRSA Next Gen by PCR, Nasal     Status: None   Collection Time: 11/30/23 12:14 AM   Specimen: Nasal Mucosa; Nasal Swab  Result Value Ref Range Status   MRSA by PCR Next Gen NOT DETECTED NOT DETECTED Final    Comment: (NOTE) The GeneXpert MRSA Assay (FDA approved for NASAL specimens only), is one component of a comprehensive MRSA colonization surveillance program. It is not intended to diagnose MRSA infection nor to guide or monitor treatment for MRSA infections. Test performance is not FDA approved in patients less than 37 years old. Performed at [facility], [address] [place], [place] [address]      Labs: Basic Metabolic Panel: Recent Labs  Lab 11/29/23 2024 12/03/23 0912 12/04/23 0600  NA 142 137 140  K 3.8 3.4* 3.6  CL 106 106 106  CO2 22 23 24   GLUCOSE 206* 127* 112*  BUN 18 25* 24*  CREATININE 0.81 0.83 0.83  CALCIUM 9.5 8.8* 9.1  MG  --  2.0 2.1  PHOS  --  3.5 3.8   Liver Function Tests: Recent Labs  Lab 11/29/23 2024 12/03/23 0912 12/04/23 0600  AST 24 16 18   ALT 19 14 15   ALKPHOS 56 48 52  BILITOT 0.6 0.9 0.7  PROT 7.8 6.8 6.9  ALBUMIN 4.2 3.8 3.8   No results for input(s):  "LIPASE", "AMYLASE" in the last 168 hours. No results for input(s): "AMMONIA" in the last 168 hours. CBC: Recent Labs  Lab 11/29/23 2024 12/03/23 0912 12/04/23 0600  WBC 12.6* 6.9 6.0  NEUTROABS  --  4.1 2.5  HGB 14.9 13.8 14.1  HCT 45.9 42.0 42.8  MCV 92.2 90.5 90.1  PLT 215 197 211   Cardiac Enzymes: No results for input(s): "CKTOTAL", "CKMB", "CKMBINDEX", "TROPONINI" in the last 168 hours. BNP: BNP (last 3 results) No results for input(s): "BNP" in the last 8760 hours.  ProBNP (last 3 results) No results for input(s): "PROBNP" in the last 8760 hours.  CBG: Recent Labs  Lab 12/03/23 0605 12/03/23 1126 12/03/23 1607 12/03/23 2119 12/04/23 0624  GLUCAP 115* 101* 107* 107* 124*    Signed:  Rhetta Mura MD   [facility] 12/04/2023, 10:13 AM
